# Patient Record
Sex: Male | Born: 1960
Health system: Southern US, Community
[De-identification: ages and names within clinical notes are randomized; demographics above are authoritative.]

## PROBLEM LIST (undated history)

## (undated) DIAGNOSIS — I2699 Other pulmonary embolism without acute cor pulmonale: Secondary | ICD-10-CM

## (undated) DIAGNOSIS — J189 Pneumonia, unspecified organism: Secondary | ICD-10-CM

## (undated) DIAGNOSIS — I82409 Acute embolism and thrombosis of unspecified deep veins of unspecified lower extremity: Secondary | ICD-10-CM

## (undated) HISTORY — PX: APPENDECTOMY: SHX54

## (undated) HISTORY — DX: Acute embolism and thrombosis of unspecified deep veins of unspecified lower extremity: I82.409

## (undated) HISTORY — PX: OTHER SURGICAL HISTORY: SHX169

## (undated) HISTORY — PX: TONSILLECTOMY: SUR1361

---

## 2007-05-18 ENCOUNTER — Observation Stay (HOSPITAL_COMMUNITY): Admission: EM | Admit: 2007-05-18 | Discharge: 2007-05-19 | Payer: Self-pay | Admitting: Emergency Medicine

## 2007-05-18 ENCOUNTER — Encounter (INDEPENDENT_AMBULATORY_CARE_PROVIDER_SITE_OTHER): Payer: Self-pay | Admitting: General Surgery

## 2007-12-17 ENCOUNTER — Emergency Department (HOSPITAL_COMMUNITY): Admission: EM | Admit: 2007-12-17 | Discharge: 2007-12-17 | Payer: Self-pay | Admitting: Emergency Medicine

## 2007-12-26 ENCOUNTER — Emergency Department (HOSPITAL_COMMUNITY): Admission: EM | Admit: 2007-12-26 | Discharge: 2007-12-26 | Payer: Self-pay | Admitting: Emergency Medicine

## 2010-07-12 NOTE — H&P (Signed)
NAME:  BURGESS, SHERIFF NO.:  [id number]   MEDICAL RECORD NO.:  [id number]          PATIENT TYPE:  INP   LOCATION:  5127                         FACILITY:  [facility]   PHYSICIAN:  Lennie Muckle, MD      DATE OF BIRTH:  1960-11-16   DATE OF ADMISSION:  05/18/2007  DATE OF DISCHARGE:                              HISTORY & PHYSICAL   REASON FOR ADMISSION:  Appendicitis.   HISTORY OF PRESENT ILLNESS:  Mr. Whetsel is a 50 year old male who began  having abdominal pain approximately 9:00 on Friday evening.  The pain  persisted, became somewhat worse, was originally located higher in the  right side of the abdomen, however, was migrated down into the  suprapubic region.  He has had no diarrhea, but has had nausea and  vomiting.  The pain is worse with ambulation.  He has not been around  any sick contacts or been sick himself.  CT scan was obtained which  revealed a thickened appendix with a fecalith.   PAST MEDICAL HISTORY:  Negative.   PAST SURGICAL HISTORY:  None.   SOCIAL HISTORY:  Does smoke, occasional alcohol.  No drug allergies.   REVIEW OF SYSTEMS:  Negative.   PHYSICAL EXAMINATION:  GENERAL:  He is a well-developed, well-nourished  male.  No acute distress.  HEENT:  Normocephalic, atraumatic.  Pupils are equal and round.  CHEST:  Clear to auscultation bilaterally.  CARDIOVASCULAR:  Regular rate and rhythm.  ABDOMEN:   DICTATION ENDED HERE      Lennie Muckle, MD     ALA/MEDQ  D:  05/19/2007  T:  05/19/2007  Job:  [id number]

## 2010-07-12 NOTE — H&P (Signed)
NAME:  Daniel Glover, Daniel Glover NO.:  [id number]   MEDICAL RECORD NO.:  [id number]          PATIENT TYPE:  INP   LOCATION:  5127                         FACILITY:  [facility]   PHYSICIAN:  Lennie Muckle, MD      DATE OF BIRTH:  03/22/1960   DATE OF ADMISSION:  05/18/2007  DATE OF DISCHARGE:                              HISTORY & PHYSICAL   DIAGNOSIS:  Appendicitis.   HISTORY OF PRESENT ILLNESS:  Mr. Rishel is a 50 year old male who began  having abdominal pain at approximately 9 o'clock last night.  The pain  continued which was located in the right lower quadrant.  It was  described as sharp pain.  He also had nausea and vomiting.  The pain was  worse with movement.  He has not had any similar types of pain.  No  fevers or chills.  No diarrhea.  A CT scan was obtained by the emergency  department which showed a thickened appendix.  No significant stranding  and no free fluid, but an enlarged appendix with a fecalith.   PAST MEDICAL HISTORY:  None.   PAST SURGICAL HISTORY:  None.   SOCIAL HISTORY:  He does occasionally have alcohol intake and smokes  tobacco.   ALLERGIES:  No drug allergies.   MEDICATIONS:  No medications.   REVIEW OF SYSTEMS:  Negative.   PHYSICAL EXAMINATION:  GENERAL:  He is a thin male, no acute distress.  VITAL SIGNS:  Blood pressure is 121/72, temperature 98, pulse is 54.  HEENT:  Extraocular muscles are intact.  CHEST:  Clear to auscultation bilaterally.  CARDIOVASCULAR:  Regular rate and rhythm.  ABDOMEN:  Scaphoid.  He is guarding more so on the right than the left.  He is tender to palpation in the right lower quadrant and significantly  tender in the suprapubic region on the right.  No rebound tenderness.   LABORATORY DATA:  Urinalysis is negative.  His white count is normal at  9.7.  He has some mild elevation of his bilirubin at 2.7.  Lipase is  mildly elevated at 64.   ASSESSMENT/PLAN:  Appendicitis.  Laparoscopic appendectomy for  tonight.  I discussed the risks of the surgery with the patient.  Will attempt  laparoscopic appendectomy, possibility of an open procedure.  I did  discuss with Mr.  Kroeker that if he has significant amount  of purulent fluid or findings,  I will continue antibiotics for a few days.  If this is relatively  unremarkable he will likely be able to be discharged home the following  day.  I also discussed the possibility of an abscess postoperatively.  Will proceed at the OR shortly.      Lennie Muckle, MD  Electronically Signed     ALA/MEDQ  D:  05/18/2007  T:  05/19/2007  Job:  [id number]

## 2010-07-12 NOTE — Op Note (Signed)
NAME:  Daniel Glover, Daniel Glover NO.:  [id number]   MEDICAL RECORD NO.:  [id number]          PATIENT TYPE:  INP   LOCATION:  1826                         FACILITY:  [facility]   PHYSICIAN:  Lennie Muckle, MD      DATE OF BIRTH:  1960/05/01   DATE OF PROCEDURE:  DATE OF DISCHARGE:                               OPERATIVE REPORT   DATE OF PROCEDURE:  May 18, 2007.   PREOPERATIVE DIAGNOSIS:  Appendicitis.   POSTOPERATIVE DIAGNOSIS:  Appendicitis.   PROCEDURE PERFORMED:  Laparoscopic appendectomy.   SURGEON:  Lennie Muckle, MD   ASSISTANT:  None.   ANESTHESIA:  General endotracheal anesthesia.   DRAINS:  None.   ESTIMATED BLOOD LOSS:  Minimal.   SPECIMENS:  Appendix.   COMPLICATIONS:  No immediate complications.   INDICATIONS FOR PROCEDURE:  Daniel Glover is a 50 year old male who began  hurting at 9 o'clock last night.  The pain was persistent, unrelenting.  It was located in the right lower quadrant and migrated down to the  suprapubic region.  CT revealed an enlarged appendix with a fecalith.  White count was normal and no fevers.  However, examination was  consistent with appendicitis.  Informed consent was obtained prior to  the procedure.   PROCEDURE IN DETAIL:  Daniel Glover was identified in the preoperative  holding suite, taken to the operating room and placed in the supine  position.  He was  given Zosyn preoperatively.  He was placed under  general endotracheal anesthesia.  He then had a Foley catheter placed.  His abdomen was prepped and draped in the usual sterile fashion.  Time-  out procedure indicated patient and procedure to be performed.  A  supraumbilical incision was placed with a #11 blade.  A Veress needle  was placed in the abdominal cavity.  After adequate pneumosufflation a 5  mm trocar was placed in the abdominal cavity using the Opti-View.  There  was no evidence upon placement of the camera or the Veress needle.  Another 5 mm trocar was placed  in the right upper quadrant.  The  appendix was identified, coursing inferiorly, and was adhered slightly  to the abdominal wall.  This was taken down very easily without  complications with blunt dissection.  A 12 mm trocar was placed in the  left lower quadrant.  The appendix was grasped and retracted away from  the cecum.  A rent was made in the peritoneum at the base of the  appendix.  This was stapled using a laparoscopic GIA stapler.  The  mesoappendix was also stapled using a vascular load of the laparoscopic  stapler.  The abdomen was irrigated with one liter of saline.  I did  briefly cauterize the staple line at the mesoappendix due to a minimal  amount of oozing.  The area was once again inspected and there was no  oozing.  The appendix was removed from the abdominal cavity in the Endo-  catch bag.  I then inspected the abdominal cavity.  There was no  evidence of bleeding, either at the  staple line or at any other area of  the abdominal cavity.  The 12 mm port site was closed with 0 Vicryl  suture of the fascia.  The skin was closed with 4-0 Vicryl suture.  Steri-Strips were placed as final dressing.  The patient was extubated,  transported to the post anesthesia care unit in stable condition.   DISCHARGE INSTRUCTIONS:  The patient will likely be able to be  discharged home tomorrow, if his pain is controlled and he is tolerating  a liquid diet.  He will follow up with me in approximately two or three  weeks.      Lennie Muckle, MD  Electronically Signed     ALA/MEDQ  D:  05/18/2007  T:  05/19/2007  Job:  [id number]

## 2010-11-21 LAB — HEPATIC FUNCTION PANEL
ALT: 14
Alkaline Phosphatase: 63
Indirect Bilirubin: 2.2 — ABNORMAL HIGH
Total Protein: 7.2

## 2010-11-21 LAB — I-STAT 8, (EC8 V) (CONVERTED LAB)
BUN: 12
Glucose, Bld: 119 — ABNORMAL HIGH
HCT: 55 — ABNORMAL HIGH
Operator id: 265201
Potassium: 4.4
pCO2, Ven: 52 — ABNORMAL HIGH
pH, Ven: 7.406 — ABNORMAL HIGH

## 2010-11-21 LAB — URINALYSIS, ROUTINE W REFLEX MICROSCOPIC
Bilirubin Urine: NEGATIVE
Glucose, UA: NEGATIVE
Hgb urine dipstick: NEGATIVE
Ketones, ur: NEGATIVE
Protein, ur: NEGATIVE
Specific Gravity, Urine: 1.015
Urobilinogen, UA: 1

## 2010-11-21 LAB — CBC
MCV: 89.5
Platelets: 214
WBC: 9.7

## 2010-11-21 LAB — POCT I-STAT CREATININE
Creatinine, Ser: 1.2
Operator id: 265201

## 2010-11-21 LAB — DIFFERENTIAL
Lymphocytes Relative: 6 — ABNORMAL LOW
Neutro Abs: 8.8 — ABNORMAL HIGH

## 2011-10-28 ENCOUNTER — Emergency Department (HOSPITAL_COMMUNITY): Payer: Self-pay

## 2011-10-28 ENCOUNTER — Encounter (HOSPITAL_COMMUNITY): Payer: Self-pay | Admitting: *Deleted

## 2011-10-28 DIAGNOSIS — R079 Chest pain, unspecified: Secondary | ICD-10-CM | POA: Insufficient documentation

## 2011-10-28 DIAGNOSIS — J189 Pneumonia, unspecified organism: Secondary | ICD-10-CM | POA: Insufficient documentation

## 2011-10-28 DIAGNOSIS — F17200 Nicotine dependence, unspecified, uncomplicated: Secondary | ICD-10-CM | POA: Insufficient documentation

## 2011-10-28 LAB — CBC WITH DIFFERENTIAL/PLATELET
Hemoglobin: 14.6 g/dL (ref 13.0–17.0)
Lymphocytes Relative: 17 % (ref 12–46)
Lymphs Abs: 1.8 10*3/uL (ref 0.7–4.0)
MCV: 88.3 fL (ref 78.0–100.0)
Monocytes Relative: 9 % (ref 3–12)
Neutrophils Relative %: 73 % (ref 43–77)
Platelets: 176 10*3/uL (ref 150–400)
RDW: 14.1 % (ref 11.5–15.5)

## 2011-10-28 LAB — BASIC METABOLIC PANEL
BUN: 13 mg/dL (ref 6–23)
GFR calc Af Amer: 88 mL/min — ABNORMAL LOW (ref >90)
GFR calc non Af Amer: 76 mL/min — ABNORMAL LOW (ref >90)
Potassium: 3.9 mEq/L (ref 3.5–5.1)

## 2011-10-28 NOTE — ED Notes (Signed)
PT reports SHOB and Lt chest  Wall pain that increases when inhaling.. Pt also has temp 100.2

## 2011-10-29 ENCOUNTER — Emergency Department (HOSPITAL_COMMUNITY)
Admission: EM | Admit: 2011-10-29 | Discharge: 2011-10-29 | Disposition: A | Discharge status: Home / Self Care | Payer: Self-pay | Attending: Emergency Medicine | Admitting: Emergency Medicine

## 2011-10-29 ENCOUNTER — Encounter (HOSPITAL_COMMUNITY): Payer: Self-pay | Admitting: Emergency Medicine

## 2011-10-29 DIAGNOSIS — J189 Pneumonia, unspecified organism: Secondary | ICD-10-CM

## 2011-10-29 DIAGNOSIS — Z720 Tobacco use: Secondary | ICD-10-CM

## 2011-10-29 HISTORY — DX: Pneumonia, unspecified organism: J18.9

## 2011-10-29 MED ORDER — TRAMADOL HCL 50 MG PO TABS: 50.0000 mg | ORAL_TABLET | Freq: Four times a day (QID) | ORAL | Status: AC | PRN

## 2011-10-29 MED ORDER — OXYCODONE-ACETAMINOPHEN 5-325 MG PO TABS
2.0000 | ORAL_TABLET | Freq: Once | ORAL | Status: AC
  Administered 2011-10-29: 2 via ORAL
  Filled 2011-10-29: qty 2

## 2011-10-29 MED ORDER — LEVOFLOXACIN 750 MG PO TABS
750.0000 mg | ORAL_TABLET | Freq: Every day | ORAL | Status: DC
  Administered 2011-10-29: 750 mg via ORAL
  Filled 2011-10-29: qty 1

## 2011-10-29 MED ORDER — LEVOFLOXACIN 750 MG PO TABS: ORAL_TABLET | ORAL | Status: DC

## 2011-10-29 NOTE — ED Provider Notes (Signed)
History     CSN: [id number]  Arrival date & time 10/28/11  1846   First MD Initiated Contact with Patient 10/29/11 [phone]      Chief Complaint  Patient presents with  . Shortness of Breath    (Consider location/radiation/quality/duration/timing/severity/associated sxs/prior treatment) HPI  Daniel Glover is a 51 year old smoker with history of several episodes of pneumonia, remotely. He presents today with complaints of left-sided chest pain and some mild shortness of breath with exertion. His pain localizes to the lower lateral region of the chest. It's worse with movement. It is not worse with deep inspiration. The patient has had a productive cough. He denies fever. His pain is nonradiating. At worst, it  is an 8/10. The patient says it feels like multiple previous episodes of pneumonia. Pain is constant.  Past Medical History  Diagnosis Date  . Pneumonia     Past Surgical History  Procedure Date  . Tonsillectomy     History reviewed. No pertinent family history.  History  Substance Use Topics  . Smoking status: Current Everyday Smoker    Types: Cigarettes  . Smokeless tobacco: Never Used  . Alcohol Use: No      Review of Systems  Gen: no weight loss, fevers, chills, night sweats Eyes: no discharge or drainage, no occular pain or visual changes Nose: no epistaxis or rhinorrhea Mouth: no dental pain, no sore throat Neck: no neck pain Lungs: As per history of present illness, otherwise negative CV: As per history of present illness, no palpitations, no dependent edema or orthopnea Abd: no abdominal pain, nausea, vomiting GU: no dysuria or gross hematuria MSK: no myalgias or arthralgias Neuro: no headache, no focal neurologic deficits Skin: no rash Psyche: negative.  Allergies  Review of patient's allergies indicates no known allergies.  Home Medications   Current Outpatient Rx  Name Route Sig Dispense Refill  . BC HEADACHE POWDER PO Oral Take 1 packet by  mouth daily as needed. For pain      BP 106/73  Pulse 72  Temp 98.3 F (36.8 C) (Oral)  Resp 19  SpO2 99%  Physical Exam  Gen: well developed and well nourished appearing Head: NCAT Eyes: PERL, EOMI Nose: no epistaixis or rhinorrhea Mouth/throat: mucosa is moist and pink Neck: supple, no stridor Lungs: CTA B, no wheezing, rhonchi or rales CV: RRR, no murmur, ext well perfused Abd: soft, notender Back: normal to inspection Skin: no rash, wnl Neuro: CN ii-xii grossly intact, no focal deficits Psyche; normal affect,  calm and cooperative.   ED Course  Procedures (including critical care time)  Labs Reviewed  CBC WITH DIFFERENTIAL - Abnormal; Notable for the following:    WBC 11.0 (*)     Neutro Abs 8.0 (*)     All other components within normal limits  BASIC METABOLIC PANEL - Abnormal; Notable for the following:    GFR calc non Af Amer 76 (*)     GFR calc Af Amer 88 (*)     All other components within normal limits   Dg Chest 2 View  10/28/2011  *RADIOLOGY REPORT*  Clinical Data: Shortness of breath, left chest pain, history smoking, pneumonia  CHEST - 2 VIEW  Comparison: None  Findings: Normal heart size, mediastinal contours, and pulmonary vascularity. Minimal atelectasis right base. Atelectasis versus early infiltrate at left lower lobe. Remaining lungs clear. No pleural effusion or pneumothorax. Bones unremarkable.  IMPRESSION: Atelectasis versus infiltrate left lower lobe.   Original Report Authenticated By: Redge Gainer.  BOLES, M.D.      EKG: nsr, no acute ischemic changes, normal intervals, normal axis, normal qrs complex   MDM  Patient with LLL infiltrate on xray and sx consistent with previous episodes of pneumonia. We will tx with Percocet and first dose of PO Levaquin. Patient counseled to return to the ED for worsening symptoms, shortness of breath, persistent fever > 101.5 or any other urgent health concerns. He is referred to [facility] for outpt f/u in 2 days.          Daniel Loosen, MD 10/29/11 [phone]

## 2012-01-07 ENCOUNTER — Encounter (HOSPITAL_COMMUNITY): Payer: Self-pay | Admitting: Emergency Medicine

## 2012-01-07 ENCOUNTER — Inpatient Hospital Stay (HOSPITAL_COMMUNITY)
Admission: EM | Admit: 2012-01-07 | Discharge: 2012-01-14 | DRG: 176 | Disposition: A | Discharge status: Home / Self Care | Payer: Self-pay | Attending: Family Medicine | Admitting: Family Medicine

## 2012-01-07 ENCOUNTER — Emergency Department (HOSPITAL_COMMUNITY): Payer: Self-pay

## 2012-01-07 ENCOUNTER — Observation Stay (HOSPITAL_COMMUNITY): Payer: Self-pay

## 2012-01-07 DIAGNOSIS — I82409 Acute embolism and thrombosis of unspecified deep veins of unspecified lower extremity: Secondary | ICD-10-CM | POA: Diagnosis present

## 2012-01-07 DIAGNOSIS — I824Z9 Acute embolism and thrombosis of unspecified deep veins of unspecified distal lower extremity: Secondary | ICD-10-CM | POA: Diagnosis present

## 2012-01-07 DIAGNOSIS — R0602 Shortness of breath: Secondary | ICD-10-CM

## 2012-01-07 DIAGNOSIS — I5189 Other ill-defined heart diseases: Secondary | ICD-10-CM | POA: Diagnosis present

## 2012-01-07 DIAGNOSIS — I513 Intracardiac thrombosis, not elsewhere classified: Secondary | ICD-10-CM | POA: Diagnosis present

## 2012-01-07 DIAGNOSIS — J189 Pneumonia, unspecified organism: Secondary | ICD-10-CM | POA: Diagnosis present

## 2012-01-07 DIAGNOSIS — F17200 Nicotine dependence, unspecified, uncomplicated: Secondary | ICD-10-CM | POA: Diagnosis present

## 2012-01-07 DIAGNOSIS — I2699 Other pulmonary embolism without acute cor pulmonale: Principal | ICD-10-CM | POA: Diagnosis present

## 2012-01-07 DIAGNOSIS — R071 Chest pain on breathing: Secondary | ICD-10-CM | POA: Diagnosis present

## 2012-01-07 LAB — CBC
HCT: 43.2 % (ref 39.0–52.0)
Hemoglobin: 15.6 g/dL (ref 13.0–17.0)
MCH: 31.3 pg (ref 26.0–34.0)
MCHC: 36.1 g/dL — ABNORMAL HIGH (ref 30.0–36.0)
MCV: 86.6 fL (ref 78.0–100.0)
Platelets: 159 K/uL (ref 150–400)
RBC: 4.99 MIL/uL (ref 4.22–5.81)
RDW: 13.5 % (ref 11.5–15.5)
WBC: 13.6 K/uL — ABNORMAL HIGH (ref 4.0–10.5)

## 2012-01-07 LAB — POCT I-STAT, CHEM 8
BUN: 9 mg/dL (ref 6–23)
Calcium, Ion: 1.13 mmol/L (ref 1.12–1.23)
Chloride: 103 meq/L (ref 96–112)
Creatinine, Ser: 1.2 mg/dL (ref 0.50–1.35)
Glucose, Bld: 114 mg/dL — ABNORMAL HIGH (ref 70–99)
HCT: 46 % (ref 39.0–52.0)
Hemoglobin: 15.6 g/dL (ref 13.0–17.0)
Potassium: 3.9 meq/L (ref 3.5–5.1)
Sodium: 138 meq/L (ref 135–145)
TCO2: 25 mmol/L (ref 0–100)

## 2012-01-07 LAB — TROPONIN I
Troponin I: <0.3 ng/mL (ref <0.30)
Troponin I: <0.3 ng/mL (ref <0.30)

## 2012-01-07 LAB — COMPREHENSIVE METABOLIC PANEL
AST: 20 U/L (ref 0–37)
Albumin: 3.3 g/dL — ABNORMAL LOW (ref 3.5–5.2)
Calcium: 9 mg/dL (ref 8.4–10.5)
Creatinine, Ser: 1.06 mg/dL (ref 0.50–1.35)
GFR calc non Af Amer: 81 mL/min — ABNORMAL LOW (ref >90)
Total Protein: 7 g/dL (ref 6.0–8.3)

## 2012-01-07 LAB — LEGIONELLA ANTIGEN, URINE: Legionella Antigen, Urine: NEGATIVE

## 2012-01-07 LAB — STREP PNEUMONIAE URINARY ANTIGEN: Strep Pneumo Urinary Antigen: NEGATIVE

## 2012-01-07 LAB — ANTITHROMBIN III: AntiThromb III Func: 84 % (ref 75–120)

## 2012-01-07 LAB — HEPARIN LEVEL (UNFRACTIONATED)
Heparin Unfractionated: 0.24 IU/mL — ABNORMAL LOW (ref 0.30–0.70)
Heparin Unfractionated: 0.27 IU/mL — ABNORMAL LOW (ref 0.30–0.70)

## 2012-01-07 MED ORDER — OXYCODONE-ACETAMINOPHEN 5-325 MG PO TABS
2.0000 | ORAL_TABLET | Freq: Once | ORAL | Status: AC
  Administered 2012-01-07: 2 via ORAL
  Filled 2012-01-07: qty 2

## 2012-01-07 MED ORDER — SODIUM CHLORIDE 0.9 % IV SOLN
INTRAVENOUS | Status: DC
  Administered 2012-01-07 – 2012-01-09 (×4): via INTRAVENOUS
  Administered 2012-01-09: 100 mL/h via INTRAVENOUS
  Administered 2012-01-10: 21:00:00 via INTRAVENOUS
  Administered 2012-01-10: 100 mL via INTRAVENOUS
  Administered 2012-01-11: 20:00:00 via INTRAVENOUS
  Administered 2012-01-12: 100 mL/h via INTRAVENOUS
  Administered 2012-01-12 – 2012-01-13 (×2): via INTRAVENOUS

## 2012-01-07 MED ORDER — MOXIFLOXACIN HCL IN NACL 400 MG/250ML IV SOLN
400.0000 mg | Freq: Once | INTRAVENOUS | Status: AC
  Administered 2012-01-07: 400 mg via INTRAVENOUS
  Filled 2012-01-07: qty 250

## 2012-01-07 MED ORDER — IOHEXOL 350 MG/ML SOLN
100.0000 mL | Freq: Once | INTRAVENOUS | Status: AC | PRN
Start: 2012-01-07 — End: 2012-01-07
  Administered 2012-01-07: 100 mL via INTRAVENOUS

## 2012-01-07 MED ORDER — HEPARIN BOLUS VIA INFUSION
4000.0000 [IU] | Freq: Once | INTRAVENOUS | Status: AC
  Administered 2012-01-07: 4000 [IU] via INTRAVENOUS

## 2012-01-07 MED ORDER — OXYCODONE HCL 5 MG PO TABS
5.0000 mg | ORAL_TABLET | ORAL | Status: DC | PRN
  Administered 2012-01-07 (×2): 10 mg via ORAL
  Administered 2012-01-08 (×2): 5 mg via ORAL
  Administered 2012-01-08 – 2012-01-09 (×3): 10 mg via ORAL
  Filled 2012-01-07 (×4): qty 2
  Filled 2012-01-07: qty 1
  Filled 2012-01-07: qty 2
  Filled 2012-01-07: qty 1

## 2012-01-07 MED ORDER — ENOXAPARIN SODIUM 40 MG/0.4ML ~~LOC~~ SOLN
40.0000 mg | SUBCUTANEOUS | Status: DC
  Filled 2012-01-07: qty 0.4

## 2012-01-07 MED ORDER — DEXTROSE 5 % IV SOLN
1.0000 g | INTRAVENOUS | Status: DC
  Administered 2012-01-07 – 2012-01-10 (×4): 1 g via INTRAVENOUS
  Filled 2012-01-07 (×4): qty 10

## 2012-01-07 MED ORDER — ONDANSETRON HCL 4 MG/2ML IJ SOLN
4.0000 mg | Freq: Once | INTRAMUSCULAR | Status: AC
  Administered 2012-01-07: 4 mg via INTRAVENOUS
  Filled 2012-01-07 (×2): qty 2

## 2012-01-07 MED ORDER — HEPARIN BOLUS VIA INFUSION
1500.0000 [IU] | Freq: Once | INTRAVENOUS | Status: AC
  Administered 2012-01-07: 1500 [IU] via INTRAVENOUS
  Filled 2012-01-07: qty 1500

## 2012-01-07 MED ORDER — HEPARIN (PORCINE) IN NACL 100-0.45 UNIT/ML-% IJ SOLN
1500.0000 [IU]/h | INTRAMUSCULAR | Status: DC
  Administered 2012-01-07 – 2012-01-08 (×2): 1500 [IU]/h via INTRAVENOUS
  Filled 2012-01-07 (×3): qty 250

## 2012-01-07 MED ORDER — MORPHINE SULFATE 4 MG/ML IJ SOLN
4.0000 mg | Freq: Once | INTRAMUSCULAR | Status: AC
  Administered 2012-01-07: 4 mg via INTRAVENOUS
  Filled 2012-01-07: qty 1

## 2012-01-07 MED ORDER — HEPARIN BOLUS VIA INFUSION
1000.0000 [IU] | Freq: Once | INTRAVENOUS | Status: AC
  Administered 2012-01-07: 1000 [IU] via INTRAVENOUS
  Filled 2012-01-07: qty 1000

## 2012-01-07 MED ORDER — WARFARIN - PHARMACIST DOSING INPATIENT
Freq: Every day | Status: DC
  Administered 2012-01-09 – 2012-01-13 (×3)

## 2012-01-07 MED ORDER — AZITHROMYCIN 500 MG PO TABS
500.0000 mg | ORAL_TABLET | ORAL | Status: DC
  Administered 2012-01-07 – 2012-01-10 (×4): 500 mg via ORAL
  Filled 2012-01-07 (×4): qty 1

## 2012-01-07 MED ORDER — HYDROMORPHONE HCL PF 1 MG/ML IJ SOLN: 1.0000 mg | INTRAMUSCULAR | Status: DC | PRN

## 2012-01-07 MED ORDER — PATIENT'S GUIDE TO USING COUMADIN BOOK
Freq: Once | Status: AC
  Administered 2012-01-07: 08:00:00
  Filled 2012-01-07: qty 1

## 2012-01-07 MED ORDER — HEPARIN (PORCINE) IN NACL 100-0.45 UNIT/ML-% IJ SOLN
1350.0000 [IU]/h | INTRAMUSCULAR | Status: DC
  Filled 2012-01-07 (×2): qty 250

## 2012-01-07 MED ORDER — ACETAMINOPHEN 325 MG PO TABS
650.0000 mg | ORAL_TABLET | Freq: Four times a day (QID) | ORAL | Status: DC | PRN
  Administered 2012-01-14: 650 mg via ORAL
  Filled 2012-01-07: qty 2

## 2012-01-07 MED ORDER — PANTOPRAZOLE SODIUM 40 MG PO TBEC
40.0000 mg | DELAYED_RELEASE_TABLET | Freq: Every day | ORAL | Status: DC
  Administered 2012-01-07 – 2012-01-13 (×7): 40 mg via ORAL
  Filled 2012-01-07 (×8): qty 1

## 2012-01-07 MED ORDER — WARFARIN VIDEO
Freq: Once | Status: AC
  Administered 2012-01-07: 08:00:00

## 2012-01-07 MED ORDER — WARFARIN SODIUM 10 MG PO TABS
10.0000 mg | ORAL_TABLET | Freq: Once | ORAL | Status: AC
  Administered 2012-01-07: 10 mg via ORAL
  Filled 2012-01-07: qty 1

## 2012-01-07 MED ORDER — HEPARIN (PORCINE) IN NACL 100-0.45 UNIT/ML-% IJ SOLN
1200.0000 [IU]/h | INTRAMUSCULAR | Status: DC
  Administered 2012-01-07: 1200 [IU]/h via INTRAVENOUS
  Filled 2012-01-07: qty 250

## 2012-01-07 MED ORDER — COUMADIN BOOK
Freq: Once | Status: AC
  Administered 2012-01-07: 14:00:00
  Filled 2012-01-07: qty 1

## 2012-01-07 NOTE — Progress Notes (Signed)
Right:  DVT noted popliteal and distal femoral veins.  No evidence of superficial thrombosis.  No Baker's cyst.  Left:  No evidence of DVT, superficial thrombosis, or Baker's cyst.

## 2012-01-07 NOTE — Progress Notes (Signed)
ANTICOAGULATION CONSULT NOTE - Follow up  Pharmacy Consult for heparin Indication: pulmonary embolus  No Known Allergies  Patient Measurements: Height: 5\' 6"  (167.6 cm) Weight: 170 lb (77.111 kg) IBW/kg (Calculated) : 63.8   Vital Signs: Temp: 98.9 F (37.2 C) (11/10 2100) Temp src: Oral (11/10 1400) BP: 120/67 mmHg (11/10 2100) Pulse Rate: 71  (11/10 2100)  Labs:  Basename 01/07/12 2040 01/07/12 1818 01/07/12 1259 01/07/12 1127 01/07/12 0724 01/07/12 0235 01/07/12 0222  HGB -- -- -- -- -- 15.6 15.6  HCT -- -- -- -- -- 46.0 43.2  PLT -- -- -- -- -- -- 159  APTT -- -- -- -- -- -- --  LABPROT -- -- -- -- -- -- --  INR -- -- -- -- -- -- --  HEPARINUNFRC 0.27* -- 0.24* -- -- -- --  CREATININE -- -- -- -- 1.06 1.20 --  CKTOTAL -- -- -- -- -- -- --  CKMB -- -- -- -- -- -- --  TROPONINI -- <0.30 -- <0.30 <0.30 -- --    Estimated Creatinine Clearance: 82.4 ml/min (by C-G formula based on Cr of 1.06).   Medical History: Past Medical History  Diagnosis Date  . Pneumonia     Medications:  No prescriptions prior to admission   Scheduled:     . azithromycin  500 mg Oral Q24H  . cefTRIAXone (ROCEPHIN)  IV  1 g Intravenous Q24H  . [COMPLETED] coumadin book   Does not apply Once  . [COMPLETED] heparin  1,000 Units Intravenous Once  . [COMPLETED] heparin  4,000 Units Intravenous Once  . [COMPLETED]  morphine injection  4 mg Intravenous Once  . [COMPLETED] moxifloxacin  400 mg Intravenous Once  . [COMPLETED] ondansetron  4 mg Intravenous Once  . [COMPLETED] oxyCODONE-acetaminophen  2 tablet Oral Once  . pantoprazole  40 mg Oral Q1200  . [COMPLETED] patient's guide to using coumadin book   Does not apply Once  . [COMPLETED] warfarin  10 mg Oral ONCE-1800  . [COMPLETED] warfarin   Does not apply Once  . Warfarin - Pharmacist Dosing Inpatient   Does not apply q1800  . [DISCONTINUED] enoxaparin  40 mg Subcutaneous Q24H   Infusions:     . sodium chloride 100 mL/hr at  01/07/12 2019  . heparin 1,350 Units/hr (01/07/12 1403)  . [DISCONTINUED] heparin 1,200 Units/hr (01/07/12 0737)    Assessment: 6hr heparin level = 0.27 on 1350 units/hr IV heparin infusion in this 51yo male on heparin and coumadin for PE.  Hypercoag panels in progress. Heparin level remains subtherapeutic.  No signs of bleeding /verified with RN.  Goal of Therapy:  Heparin level 0.3-0.7 Monitor platelets by anticoagulation protocol: Yes   Plan:  Give bolus of heparin 1500 units IV x 1 and increase Heparin drip to 1500units/hr.   Check heparin level  In ~ 6 hours with AM labs.  Daily heparin level/cbc.  Noah Delaine, RPh Clinical Pharmacist Pager: [phone] 01/07/2012 22:15PM

## 2012-01-07 NOTE — Progress Notes (Signed)
Was notified of the results of CTA showing  PE. Will start on heparin and coumadin and write for hypercoagulable panel. Will also obtain 2D echo to evaluate for pulmonaryr hypertension as I suspect this is not his first PE. Dopplers of Lower extremities to assess clot burden. He may need outpatient CA work up. Patient is hemodynamically stable.

## 2012-01-07 NOTE — Progress Notes (Signed)
ANTICOAGULATION CONSULT NOTE - Initial Consult  Pharmacy Consult for heparin/warfarin Indication: pulmonary embolus  No Known Allergies  Patient Measurements: Height: 5\' 6"  (167.6 cm) Weight: 170 lb (77.111 kg) IBW/kg (Calculated) : 63.8   Vital Signs: Temp: 98.4 F (36.9 C) (11/10 0625) Temp src: Oral (11/10 0336) BP: 111/72 mmHg (11/10 0625) Pulse Rate: 80  (11/10 0625)  Labs:  Basename 01/07/12 0235 01/07/12 0222  HGB 15.6 15.6  HCT 46.0 43.2  PLT -- 159  APTT -- --  LABPROT -- --  INR -- --  HEPARINUNFRC -- --  CREATININE 1.20 --  CKTOTAL -- --  CKMB -- --  TROPONINI -- --    Estimated Creatinine Clearance: 72.8 ml/min (by C-G formula based on Cr of 1.2).   Medical History: Past Medical History  Diagnosis Date  . Pneumonia     Medications:  No prescriptions prior to admission   Scheduled:    . azithromycin  500 mg Oral Q24H  . cefTRIAXone (ROCEPHIN)  IV  1 g Intravenous Q24H  . heparin  4,000 Units Intravenous Once  . [COMPLETED]  morphine injection  4 mg Intravenous Once  . [COMPLETED] moxifloxacin  400 mg Intravenous Once  . [COMPLETED] ondansetron  4 mg Intravenous Once  . pantoprazole  40 mg Oral Q1200  . patient's guide to using coumadin book   Does not apply Once  . warfarin  10 mg Oral ONCE-1800  . warfarin   Does not apply Once  . Warfarin - Pharmacist Dosing Inpatient   Does not apply q1800  . [DISCONTINUED] enoxaparin  40 mg Subcutaneous Q24H   Infusions:    . sodium chloride    . heparin      Assessment: 51yo male c/o Sx similar to prior PNA, found on CT to have prominent PE within pulmonary arteries to all lobes of both lungs, to begin anticoagulation; hypercoag panels to be ordered, thought that this may not be pt's first PE.  Goal of Therapy:  INR 2-3 Heparin level 0.3-0.7 units/ml Monitor platelets by anticoagulation protocol: Yes   Plan:  Heparin bolus of 4000 units ordered by admitting MD; will begin heparin gtt at 1200  units/hr and monitor heparin levels and CBC; will give Coumadin 10mg  po x1 and monitor INR for dose adjustments and begin Coumadin education.  Colleen Can PharmD BCPS 01/07/2012,6:50 AM

## 2012-01-07 NOTE — ED Notes (Signed)
Pt complains of difficulty breathing with pain on left side of lower chest/rib cage.  Pt states that he has had pneumonia before and that is what this feels like to him.  Pt. Has normal chest excursion bilaterally with no visible trauma.

## 2012-01-07 NOTE — Progress Notes (Signed)
Pt set up to watch coumadin video on channel 109.  Pt has no questions or concerns at present.

## 2012-01-07 NOTE — ED Provider Notes (Signed)
History     CSN: [id number]  Arrival date & time 01/07/12  0107   First MD Initiated Contact with Patient 01/07/12 0206      Chief Complaint  Patient presents with  . Shortness of Breath  . Breathing Problem    (Consider location/radiation/quality/duration/timing/severity/associated sxs/prior treatment) HPI HX per PT. Fever, cough, CP and SOB x 2 days, presents with concern for recurrent pneumonia. IS a smoker, denies any other medical problems, has had 3 episodes of PNA this year, last time was 2 months ago. Tonight pain is L sided sharp and hurts to take a deep breath, no leg pain ore swelling or h/o DVT/ PE. Mild productive sputum. Symptoms described as MOD to Severe. Worse with exertion, no known alleviating fators Past Medical History  Diagnosis Date  . Pneumonia     Past Surgical History  Procedure Date  . Tonsillectomy   . Appendectomy     History reviewed. No pertinent family history.  History  Substance Use Topics  . Smoking status: Current Every Day Smoker -- 1.0 packs/day for 15 years    Types: Cigarettes  . Smokeless tobacco: Never Used  . Alcohol Use: Yes     Comment: occassionally      Review of Systems  Constitutional: Positive for fever and chills.  HENT: Negative for trouble swallowing, neck pain and neck stiffness.   Eyes: Negative for pain.  Respiratory: Positive for cough and shortness of breath.   Cardiovascular: Positive for chest pain.  Gastrointestinal: Negative for vomiting and abdominal pain.  Genitourinary: Negative for dysuria.  Musculoskeletal: Negative for back pain.  Skin: Negative for rash.  Neurological: Negative for headaches.  All other systems reviewed and are negative.    Allergies  Review of patient's allergies indicates no known allergies.  Home Medications  No current outpatient prescriptions on file.  BP 138/69  Pulse 86  Temp 99.4 F (37.4 C) (Oral)  Resp 19  SpO2 98%  Physical Exam  Constitutional: He  is oriented to person, place, and time. He appears well-developed and well-nourished.  HENT:  Head: Normocephalic and atraumatic.  Eyes: Conjunctivae normal and EOM are normal. Pupils are equal, round, and reactive to light.  Neck: Trachea normal. Neck supple. No thyromegaly present.  Cardiovascular: Normal rate, regular rhythm, S1 normal, S2 normal and normal pulses.     No systolic murmur is present   No diastolic murmur is present  Pulses:      Radial pulses are 2+ on the right side, and 2+ on the left side.  Pulmonary/Chest: Effort normal. He has no wheezes. He has no rhonchi. He has no rales. He exhibits no tenderness.       Mildly dec bilateral breath sounds with splinting present, no chest wall tenderness or crepitus  Abdominal: Soft. Normal appearance and bowel sounds are normal. There is no tenderness. There is no rebound, no guarding, no CVA tenderness and negative Murphy's sign.  Musculoskeletal:       BLE:s Calves nontender, no cords or erythema, negative Homans sign  Neurological: He is alert and oriented to person, place, and time. He has normal strength. No cranial nerve deficit or sensory deficit. GCS eye subscore is 4. GCS verbal subscore is 5. GCS motor subscore is 6.  Skin: Skin is warm and dry. No rash noted. He is not diaphoretic.  Psychiatric: His speech is normal.       Cooperative and appropriate    ED Course  Procedures (including critical care time)  Results for orders placed during the hospital encounter of 01/07/12  CBC      Component Value Range   WBC 13.6 (*) 4.0 - 10.5 K/uL   RBC 4.99  4.22 - 5.81 MIL/uL   Hemoglobin 15.6  13.0 - 17.0 g/dL   HCT 16.1  09.6 - 04.5 %   MCV 86.6  78.0 - 100.0 fL   MCH 31.3  26.0 - 34.0 pg   MCHC 36.1 (*) 30.0 - 36.0 g/dL   RDW 40.9  81.1 - 91.4 %   Platelets 159  150 - 400 K/uL  POCT I-STAT, CHEM 8      Component Value Range   Sodium 138  135 - 145 mEq/L   Potassium 3.9  3.5 - 5.1 mEq/L   Chloride 103  96 - 112  mEq/L   BUN 9  6 - 23 mg/dL   Creatinine, Ser 7.82  0.50 - 1.35 mg/dL   Glucose, Bld 956 (*) 70 - 99 mg/dL   Calcium, Ion 2.13  0.86 - 1.23 mmol/L   TCO2 25  0 - 100 mmol/L   Hemoglobin 15.6  13.0 - 17.0 g/dL   HCT 57.8  46.9 - 62.9 %  HIV ANTIBODY (ROUTINE TESTING)      Component Value Range   HIV NON REACTIVE  NON REACTIVE  LEGIONELLA ANTIGEN, URINE      Component Value Range   Specimen Description URINE, CLEAN CATCH     Special Requests NONE     Legionella Antigen, Urine Negative for Legionella pneumophilia serogroup 1     Report Status 01/07/2012 FINAL    STREP PNEUMONIAE URINARY ANTIGEN      Component Value Range   Strep Pneumo Urinary Antigen NEGATIVE  NEGATIVE  COMPREHENSIVE METABOLIC PANEL      Component Value Range   Sodium 138  135 - 145 mEq/L   Potassium 4.2  3.5 - 5.1 mEq/L   Chloride 102  96 - 112 mEq/L   CO2 26  19 - 32 mEq/L   Glucose, Bld 105 (*) 70 - 99 mg/dL   BUN 10  6 - 23 mg/dL   Creatinine, Ser 5.28  0.50 - 1.35 mg/dL   Calcium 9.0  8.4 - 41.3 mg/dL   Total Protein 7.0  6.0 - 8.3 g/dL   Albumin 3.3 (*) 3.5 - 5.2 g/dL   AST 20  0 - 37 U/L   ALT 20  0 - 53 U/L   Alkaline Phosphatase 71  39 - 117 U/L   Total Bilirubin 1.2  0.3 - 1.2 mg/dL   GFR calc non Af Amer 81 (*) >90 mL/min   GFR calc Af Amer >90  >90 mL/min  TROPONIN I      Component Value Range   Troponin I <0.30  <0.30 ng/mL  TROPONIN I      Component Value Range   Troponin I <0.30  <0.30 ng/mL  TROPONIN I      Component Value Range   Troponin I <0.30  <0.30 ng/mL  ANTITHROMBIN III      Component Value Range   AntiThromb III Func 84  75 - 120 %  HOMOCYSTEINE      Component Value Range   Homocysteine 6.0  4.0 - 15.4 umol/L  HEPARIN LEVEL (UNFRACTIONATED)      Component Value Range   Heparin Unfractionated 0.24 (*) 0.30 - 0.70 IU/mL  HEPARIN LEVEL (UNFRACTIONATED)      Component Value Range   Heparin Unfractionated  0.27 (*) 0.30 - 0.70 IU/mL  CBC      Component Value Range   WBC  10.7 (*) 4.0 - 10.5 K/uL   RBC 4.36  4.22 - 5.81 MIL/uL   Hemoglobin 13.6  13.0 - 17.0 g/dL   HCT 16.1 (*) 09.6 - 04.5 %   MCV 88.3  78.0 - 100.0 fL   MCH 31.2  26.0 - 34.0 pg   MCHC 35.3  30.0 - 36.0 g/dL   RDW 40.9  81.1 - 91.4 %   Platelets 167  150 - 400 K/uL  HEPARIN LEVEL (UNFRACTIONATED)      Component Value Range   Heparin Unfractionated 0.49  0.30 - 0.70 IU/mL  PROTIME-INR      Component Value Range   Prothrombin Time 15.3 (*) 11.6 - 15.2 seconds   INR 1.23  0.00 - 1.49  BASIC METABOLIC PANEL      Component Value Range   Sodium 137  135 - 145 mEq/L   Potassium 3.8  3.5 - 5.1 mEq/L   Chloride 102  96 - 112 mEq/L   CO2 25  19 - 32 mEq/L   Glucose, Bld 85  70 - 99 mg/dL   BUN 12  6 - 23 mg/dL   Creatinine, Ser 7.82  0.50 - 1.35 mg/dL   Calcium 8.7  8.4 - 95.6 mg/dL   GFR calc non Af Amer 83 (*) >90 mL/min   GFR calc Af Amer >90  >90 mL/min   Dg Chest 2 View  01/07/2012  *RADIOLOGY REPORT*  Clinical Data: Shortness of breath and left chest pain.  CHEST - 2 VIEW  Comparison: Chest radiograph performed 10/28/2011  Findings: The lungs are well-aerated.  Mild focal left lower lobe opacity is compatible with pneumonia.  There is no evidence of pleural effusion or pneumothorax.  The heart is normal in size; the mediastinal contour is within normal limits.  No acute osseous abnormalities are seen.  IMPRESSION: Mild left lower lobe pneumonia noted.   Original Report Authenticated By: Tonia Ghent, M.D.    Ct Angio Chest Pe W/cm &/or Wo Cm  01/07/2012  *RADIOLOGY REPORT*  Clinical Data: Left-sided pleuritic chest pain and shortness of breath.  CT ANGIOGRAPHY CHEST  Technique:  Multidetector CT imaging of the chest using the standard protocol during bolus administration of intravenous contrast. Multiplanar reconstructed images including MIPs were obtained and reviewed to evaluate the vascular anatomy.  Contrast: OMNIPAQUE IOHEXOL 350 MG/ML SOLN  Comparison: Chest radiograph  performed earlier today at 02:35 a.m.  Findings: There is prominent pulmonary embolus within pulmonary arteries to all lobes of both lungs.  There is greater clot burden on the right, with pulmonary embolus noted in the right main pulmonary artery, extending into segmental and subsegmental branches.  More distal left sided pulmonary emboli are seen.  Airspace opacification within the left lingula and left lower lobe is compatible with pulmonary infarct.  Mild right basilar airspace opacity may also reflect pulmonary infarct, though there is some degree of associated bleb formation, of uncertain significance. There is no evidence of pleural effusion or pneumothorax.  No masses are identified; no abnormal focal contrast enhancement is seen.  The mediastinum is unremarkable in appearance.  No mediastinal lymphadenopathy is seen.  No pericardial effusion is identified. The great vessels are unremarkable in appearance.  No axillary lymphadenopathy is seen.  The visualized portions of the thyroid gland are unremarkable in appearance.  The visualized portions of the liver and spleen are unremarkable.  The visualized portions of the pancreas, stomach, adrenal glands and kidneys are within normal limits.  No acute osseous abnormalities are seen.  IMPRESSION:  1.  Prominent pulmonary embolus within pulmonary arteries to all lobes of both lungs.  Greater clot burden noted on the right, with pulmonary embolus in the right main pulmonary artery, extending into segmental and subsegmental branches.  More distal left sided pulmonary emboli seen. 2.  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  Critical Value/emergent results were called by telephone at the time of interpretation on 01/07/2012 at 05:59 a.m. to Dr. Sunnie Nielsen, who verbally acknowledged these results.   Original Report  Authenticated By: Tonia Ghent, M.D.      IV morphine. IV ABx  MED consult for borderline hypoxia, multiple episodes of PNA. Case d/w DR Adela Glimpse - will evaluate bedside for admit.   DR Adela Glimpse requesting CT scan prior to admit. CT reviewed and heparin initiated.  MDM   Recurrent CAP in 51 yo male. Work up as above. CXR and labs reviewed. IV heparin for PEs        Sunnie Nielsen, MD 01/08/12 [phone]

## 2012-01-07 NOTE — Progress Notes (Signed)
Patient ID: Daniel Glover  male  OZD:[id number]    DOB: October 31, 1960    DOA: 01/07/2012  PCP: Sheila Oats, MD  Assessment/Plan: Principal Problem:  *Acute Bilateral PE (pulmonary embolism):  - Started on heparin drip and Coumadin, hemodynamically stable - Hypercoagulable panel in progress - Obtain 2-D echocardiogram, Doppler ultrasound of the lower extremities positive for acute DVT in the right lower extremity - If patient becomes hemodynamically unstable or hypoxic, will check with interventional radiology if catheter directed thrombolysis is feasible  Active Problems:  CAP (community acquired pneumonia): Appears more like pulmonary infarction with atelectasis - Continue IV Rocephin and Zithromax for now, incentive spirometry, O2   Pleuritic Chest Pain  - Add oxycodone and IV Dilaudid   Acute DVT Right LE: As #1  DVT Prophylaxis: On heparin drip and Coumadin  Code Status: Full code  Disposition: Given the high clot burden and bilateral PEs, will continue heparin drip with Coumadin  Subjective: Complaining of intractable pleuritic chest pain, no nausea, vomiting, abdominal pain   Objective: Weight change:  No intake or output data in the 24 hours ending 01/07/12 1328 Blood pressure 111/72, pulse 80, temperature 98.4 F (36.9 C), temperature source Oral, resp. rate 18, height 5\' 6"  (1.676 m), weight 77.111 kg (170 lb), SpO2 94.00%.  Physical Exam: General: Alert and awake, oriented x3, not in any acute distress. HEENT: anicteric sclera, pupils reactive to light and accommodation, EOMI CVS: S1-S2 clear, no murmur rubs or gallops Chest: clear to auscultation bilaterally, no wheezing, rales or rhonchi Abdomen: soft nontender, nondistended, normal bowel sounds, no organomegaly Extremities: no cyanosis, clubbing or edema noted bilaterally Neuro: Cranial nerves II-XII intact, no focal neurological deficits  Lab Results: Basic Metabolic Panel:  Lab 01/07/12 2595 01/07/12 0235    NA 138 138  K 4.2 3.9  CL 102 103  CO2 26 --  GLUCOSE 105* 114*  BUN 10 9  CREATININE 1.06 1.20  CALCIUM 9.0 --  MG -- --  PHOS -- --   Liver Function Tests:  Lab 01/07/12 0724  AST 20  ALT 20  ALKPHOS 71  BILITOT 1.2  PROT 7.0  ALBUMIN 3.3*  CBC:  Lab 01/07/12 0235 01/07/12 0222  WBC -- 13.6*  NEUTROABS -- --  HGB 15.6 15.6  HCT 46.0 43.2  MCV -- 86.6  PLT -- 159   Cardiac Enzymes:  Lab 01/07/12 0724  CKTOTAL --  CKMB --  CKMBINDEX --  TROPONINI <0.30    Studies/Results: Dg Chest 2 View  01/07/2012  *RADIOLOGY REPORT*  Clinical Data: Shortness of breath and left chest pain.  CHEST - 2 VIEW  Comparison: Chest radiograph performed 10/28/2011  Findings: The lungs are well-aerated.  Mild focal left lower lobe opacity is compatible with pneumonia.  There is no evidence of pleural effusion or pneumothorax.  The heart is normal in size; the mediastinal contour is within normal limits.  No acute osseous abnormalities are seen.  IMPRESSION: Mild left lower lobe pneumonia noted.   Original Report Authenticated By: Tonia Ghent, M.D.    Ct Angio Chest Pe W/cm &/or Wo Cm  01/07/2012    IMPRESSION:  1.  Prominent pulmonary embolus within pulmonary arteries to all lobes of both lungs.  Greater clot burden noted on the right, with pulmonary embolus in the right main pulmonary artery, extending into segmental and subsegmental branches.  More distal left sided pulmonary emboli seen. 2.  Pulmonary infarcts of the left lingula and left lower lobe; mild right basilar airspace opacity may  also reflect pulmonary infarct, though associated blebs are of uncertain significance.  Critical Value/emergent results were called by telephone at the time of interpretation on 01/07/2012 at 05:59 a.m. to Dr. Sunnie Nielsen, who verbally acknowledged these results.   Original Report Authenticated By: Tonia Ghent, M.D.     Medications: Scheduled Meds:   . azithromycin  500 mg Oral Q24H  .  cefTRIAXone (ROCEPHIN)  IV  1 g Intravenous Q24H  . [COMPLETED] heparin  4,000 Units Intravenous Once  . [COMPLETED]  morphine injection  4 mg Intravenous Once  . [COMPLETED] moxifloxacin  400 mg Intravenous Once  . [COMPLETED] ondansetron  4 mg Intravenous Once  . [COMPLETED] oxyCODONE-acetaminophen  2 tablet Oral Once  . pantoprazole  40 mg Oral Q1200  . [COMPLETED] patient's guide to using coumadin book   Does not apply Once  . warfarin  10 mg Oral ONCE-1800  . [COMPLETED] warfarin   Does not apply Once  . Warfarin - Pharmacist Dosing Inpatient   Does not apply q1800  . [DISCONTINUED] enoxaparin  40 mg Subcutaneous Q24H      LOS: 0 days   RAI,RIPUDEEP M.D. [facility] 01/07/2012, 1:28 PM Pager: [phone]  If 7PM-7AM, please contact night-coverage [email or web address] Password [email or web address]

## 2012-01-07 NOTE — Progress Notes (Signed)
ANTICOAGULATION CONSULT NOTE - Follow up  Pharmacy Consult for heparin Indication: pulmonary embolus  No Known Allergies  Patient Measurements: Height: 5\' 6"  (167.6 cm) Weight: 170 lb (77.111 kg) IBW/kg (Calculated) : 63.8   Vital Signs: Temp: 98.4 F (36.9 C) (11/10 0625) Temp src: Oral (11/10 0336) BP: 111/72 mmHg (11/10 0625) Pulse Rate: 80  (11/10 0625)  Labs:  Basename 01/07/12 1259 01/07/12 1127 01/07/12 0724 01/07/12 0235 01/07/12 0222  HGB -- -- -- 15.6 15.6  HCT -- -- -- 46.0 43.2  PLT -- -- -- -- 159  APTT -- -- -- -- --  LABPROT -- -- -- -- --  INR -- -- -- -- --  HEPARINUNFRC 0.24* -- -- -- --  CREATININE -- -- 1.06 1.20 --  CKTOTAL -- -- -- -- --  CKMB -- -- -- -- --  TROPONINI -- <0.30 <0.30 -- --    Estimated Creatinine Clearance: 82.4 ml/min (by C-G formula based on Cr of 1.06).   Medical History: Past Medical History  Diagnosis Date  . Pneumonia     Medications:  No prescriptions prior to admission   Scheduled:     . azithromycin  500 mg Oral Q24H  . cefTRIAXone (ROCEPHIN)  IV  1 g Intravenous Q24H  . [COMPLETED] heparin  4,000 Units Intravenous Once  . [COMPLETED]  morphine injection  4 mg Intravenous Once  . [COMPLETED] moxifloxacin  400 mg Intravenous Once  . [COMPLETED] ondansetron  4 mg Intravenous Once  . [COMPLETED] oxyCODONE-acetaminophen  2 tablet Oral Once  . pantoprazole  40 mg Oral Q1200  . [COMPLETED] patient's guide to using coumadin book   Does not apply Once  . warfarin  10 mg Oral ONCE-1800  . [COMPLETED] warfarin   Does not apply Once  . Warfarin - Pharmacist Dosing Inpatient   Does not apply q1800  . [DISCONTINUED] enoxaparin  40 mg Subcutaneous Q24H   Infusions:     . sodium chloride 100 mL/hr at 01/07/12 0738  . heparin 1,200 Units/hr (01/07/12 0737)    Assessment: 51yo male on heparin and coumadin for PE.  Hypercoag panels in progress. Heparin level is subtherapeutic.  No bleeding reported.   Goal of  Therapy:  Heparin level 0.3-0.7 Monitor platelets by anticoagulation protocol: Yes   Plan:  Heparin bolus 1000 units x 1, then increase heparin drip to 1350 units/hr.   Check heparin level 6 hours after rate change. Daily heparin level/cbc.  Wendie Simmer, PharmD, BCPS Clinical Pharmacist  Pager: [phone]

## 2012-01-07 NOTE — H&P (Signed)
PCP:  none   Chief Complaint:   Chest pain  HPI: Daniel Glover is a 51 y.o. male   has a past medical history of Pneumonia.   Presented with  He woke up with pleuritic chest pain and shortness of breath. CXR worrisome for PNA. Pain is on the left side of the chest. Denies any fever. NO cough. He was mildly hypoxic on RA down to 92%.  No recent trips. No leg swelling. Denies any weight loss or night sweats. Patient states that he have had similar presentations in the past but the chest pain have been not as severe.   Review of Systems:     Pertinent positives include:shortness of breath at rest.  chest pain  Constitutional:  No weight loss, night sweats, Fevers, chills, fatigue, weight loss  HEENT:  No headaches, Difficulty swallowing,Tooth/dental problems,Sore throat,  No sneezing, itching, ear ache, nasal congestion, post nasal drip,  Cardio-vascular:  No Orthopnea, PND, anasarca, dizziness, palpitations.no Bilateral lower extremity swelling  GI:  No heartburn, indigestion, abdominal pain, nausea, vomiting, diarrhea, change in bowel habits, loss of appetite, melena, blood in stool, hematemesis Resp:  no No dyspnea on exertion, No excess mucus, no productive cough, No non-productive cough, No coughing up of blood.No change in color of mucus.No wheezing. Skin:  no rash or lesions. No jaundice GU:  no dysuria, change in color of urine, no urgency or frequency. No straining to urinate.  No flank pain.  Musculoskeletal:  No joint pain or no joint swelling. No decreased range of motion. No back pain.  Psych:  No change in mood or affect. No depression or anxiety. No memory loss.  Neuro: no localizing neurological complaints, no tingling, no weakness, no double vision, no gait abnormality, no slurred speech, no confusion  Otherwise ROS are negative except for above, 10 systems were reviewed  Past Medical History: Past Medical History  Diagnosis Date  . Pneumonia    Past  Surgical History  Procedure Date  . Tonsillectomy   . Appendectomy      Medications: Prior to Admission medications   Not on File    Allergies:  No Known Allergies  Social History:  Ambulatory   independently   Lives at   Home with family   reports that he has been smoking Cigarettes.  He has a 15 pack-year smoking history. He has never used smokeless tobacco. He reports that he drinks alcohol. He reports that he does not use illicit drugs.   Family History: family history includes Bleeding Disorder in his father; Hypertension in his brother; and Lupus in his mother.    Physical Exam: Patient Vitals for the past 24 hrs:  BP Temp Temp src Pulse Resp SpO2 Height Weight  01/07/12 0336 114/68 mmHg 99.7 F (37.6 C) Oral 96  19  96 % 5\' 6"  (1.676 m) 77.111 kg (170 lb)  01/07/12 0200 138/69 mmHg - - 86  - 98 % - -  01/07/12 0130 137/61 mmHg - - 91  - 97 % - -  01/07/12 0117 142/79 mmHg - - 93  19  92 % - -  01/07/12 0116 - 99.4 F (37.4 C) Oral - - - - -    1. General:  in No Acute distress 2. Psychological: Alert and  Oriented 3. Head/ENT:   Moist  Mucous Membranes                          Head Non  traumatic, neck supple                          Normal   Dentition 4. SKIN: normal   Skin turgor,  Skin clean Dry and intact no rash 5. Heart: Regular rate and rhythm no Murmur, Rub or gallop 6. Lungs:no wheezes or crackles  Very shallow breaths 7. Abdomen: Soft, non-tender, Non distended 8. Lower extremities: no clubbing, cyanosis, or edema 9. Neurologically Grossly intact, moving all 4 extremities equally 10. MSK: Normal range of motion  body mass index is 27.44 kg/(m^2).   Labs on Admission:   [facility] 01/07/12 0235  NA 138  K 3.9  CL 103  CO2 --  GLUCOSE 114*  BUN 9  CREATININE 1.20  CALCIUM --  MG --  PHOS --   No results found for this basename: AST:2,ALT:2,ALKPHOS:2,BILITOT:2,PROT:2,ALBUMIN:2 in the last 72 hours No results found for this basename:  LIPASE:2,AMYLASE:2 in the last 72 hours  Basename 01/07/12 0235 01/07/12 0222  WBC -- 13.6*  NEUTROABS -- --  HGB 15.6 15.6  HCT 46.0 43.2  MCV -- 86.6  PLT -- 159   No results found for this basename: CKTOTAL:3,CKMB:3,CKMBINDEX:3,TROPONINI:3 in the last 72 hours No results found for this basename: TSH,T4TOTAL,FREET3,T3FREE,THYROIDAB in the last 72 hours No results found for this basename: VITAMINB12:2,FOLATE:2,FERRITIN:2,TIBC:2,IRON:2,RETICCTPCT:2 in the last 72 hours No results found for this basename: HGBA1C    Estimated Creatinine Clearance: 72.8 ml/min (by C-G formula based on Cr of 1.2). ABG    Component Value Date/Time   HCO3 32.6* 05/18/2007 1118   TCO2 25 01/07/2012 0235     No results found for this basename: DDIMER     Cultures: No results found for this basename: sdes, specrequest, cult, reptstatus       Radiological Exams on Admission: Dg Chest 2 View  01/07/2012  *RADIOLOGY REPORT*  Clinical Data: Shortness of breath and left chest pain.  CHEST - 2 VIEW  Comparison: Chest radiograph performed 10/28/2011  Findings: The lungs are well-aerated.  Mild focal left lower lobe opacity is compatible with pneumonia.  There is no evidence of pleural effusion or pneumothorax.  The heart is normal in size; the mediastinal contour is within normal limits.  No acute osseous abnormalities are seen.  IMPRESSION: Mild left lower lobe pneumonia noted.   Original Report Authenticated By: Tonia Ghent, M.D.     Chart has been reviewed  Assessment/Plan  52 yo M w his 3rd episode of pneumonia. And severe pleuritic chest pain.   Present on Admission:  . CAP (community acquired pneumonia) - will obtain CT of the chest to evaluate lung parenchyma given recurrent episodes. Also obtain HIV serologies. Cover with rocephin and azithromycin . Chest pain on breathing - given his presentation will obtain CT of the chest to RO PE.   Prophylaxis:   Lovenox, Protonix  CODE STATUS: FULL  CODE  Other plan as per orders.  I have spent a total of 55 min on this admission  Elanore Talcott 01/07/2012, 4:19 AM

## 2012-01-08 DIAGNOSIS — I82409 Acute embolism and thrombosis of unspecified deep veins of unspecified lower extremity: Secondary | ICD-10-CM

## 2012-01-08 DIAGNOSIS — J189 Pneumonia, unspecified organism: Secondary | ICD-10-CM

## 2012-01-08 DIAGNOSIS — I2699 Other pulmonary embolism without acute cor pulmonale: Principal | ICD-10-CM

## 2012-01-08 DIAGNOSIS — R071 Chest pain on breathing: Secondary | ICD-10-CM

## 2012-01-08 DIAGNOSIS — I5189 Other ill-defined heart diseases: Secondary | ICD-10-CM

## 2012-01-08 LAB — LUPUS ANTICOAGULANT PANEL
DRVVT: 47.6 secs — ABNORMAL HIGH (ref <42.9)
Lupus Anticoagulant: NOT DETECTED
PTT Lupus Anticoagulant: 40.9 secs (ref 28.0–43.0)
dRVVT Incubated 1:1 Mix: 39.2 secs (ref <42.9)

## 2012-01-08 LAB — BASIC METABOLIC PANEL
BUN: 12 mg/dL (ref 6–23)
CO2: 25 mEq/L (ref 19–32)
Calcium: 8.7 mg/dL (ref 8.4–10.5)
Chloride: 102 mEq/L (ref 96–112)
Creatinine, Ser: 1.04 mg/dL (ref 0.50–1.35)
GFR calc Af Amer: >90 mL/min (ref >90)
GFR calc non Af Amer: 83 mL/min — ABNORMAL LOW (ref >90)
Glucose, Bld: 85 mg/dL (ref 70–99)
Potassium: 3.8 mEq/L (ref 3.5–5.1)
Sodium: 137 mEq/L (ref 135–145)

## 2012-01-08 LAB — CBC
HCT: 38.5 % — ABNORMAL LOW (ref 39.0–52.0)
Hemoglobin: 13.6 g/dL (ref 13.0–17.0)
MCH: 31.2 pg (ref 26.0–34.0)
MCHC: 35.3 g/dL (ref 30.0–36.0)
MCV: 88.3 fL (ref 78.0–100.0)
Platelets: 167 10*3/uL (ref 150–400)
RBC: 4.36 MIL/uL (ref 4.22–5.81)
RDW: 13.6 % (ref 11.5–15.5)
WBC: 10.7 10*3/uL — ABNORMAL HIGH (ref 4.0–10.5)

## 2012-01-08 LAB — PROTIME-INR
INR: 1.23 (ref 0.00–1.49)
Prothrombin Time: 15.3 seconds — ABNORMAL HIGH (ref 11.6–15.2)

## 2012-01-08 LAB — HEPARIN LEVEL (UNFRACTIONATED)
Heparin Unfractionated: 0.49 IU/mL (ref 0.30–0.70)
Heparin Unfractionated: 0.17 IU/mL — ABNORMAL LOW (ref 0.30–0.70)

## 2012-01-08 LAB — PROTEIN C ACTIVITY: Protein C Activity: 102 % (ref 75–133)

## 2012-01-08 MED ORDER — HEPARIN (PORCINE) IN NACL 100-0.45 UNIT/ML-% IJ SOLN
1500.0000 [IU]/h | INTRAMUSCULAR | Status: DC
  Administered 2012-01-08: 1750 [IU]/h via INTRAVENOUS
  Administered 2012-01-09: 1500 [IU]/h via INTRAVENOUS
  Administered 2012-01-09: 1750 [IU]/h via INTRAVENOUS
  Filled 2012-01-08 (×5): qty 250

## 2012-01-08 MED ORDER — WARFARIN SODIUM 10 MG PO TABS
10.0000 mg | ORAL_TABLET | Freq: Once | ORAL | Status: AC
  Administered 2012-01-08: 10 mg via ORAL
  Filled 2012-01-08: qty 1

## 2012-01-08 MED ORDER — HEPARIN BOLUS VIA INFUSION
2000.0000 [IU] | Freq: Once | INTRAVENOUS | Status: AC
  Administered 2012-01-08: 2000 [IU] via INTRAVENOUS
  Filled 2012-01-08: qty 2000

## 2012-01-08 NOTE — Progress Notes (Signed)
ANTICOAGULATION CONSULT NOTE - Follow Up Consult  Pharmacy Consult:  Heparin Indication:  PE + RLE DVT   No Known Allergies   Labs:  Basename 01/08/12 2043 01/08/12 1431 01/08/12 0537 01/07/12 1818 01/07/12 1127 01/07/12 0724 01/07/12 0235 01/07/12 0222  HGB -- -- 13.6 -- -- -- 15.6 --  HCT -- -- 38.5* -- -- -- 46.0 43.2  PLT -- -- 167 -- -- -- -- 159  APTT -- -- -- -- -- -- -- --  LABPROT -- -- 15.3* -- -- -- -- --  INR -- -- 1.23 -- -- -- -- --  HEPARINUNFRC 0.56 0.17* 0.49 -- -- -- -- --  CREATININE -- -- 1.04 -- -- 1.06 1.20 --  CKTOTAL -- -- -- -- -- -- -- --  CKMB -- -- -- -- -- -- -- --  TROPONINI -- -- -- <0.30 <0.30 <0.30 -- --    Estimated Creatinine Clearance: 84 ml/min (by C-G formula based on Cr of 1.04).   Assessment: 51 YOM c/o symptoms similar to prior PNA, found on CT to have prominent PE.  He also has a new DVT on RLE.    Heparin level therapeutic this evening  Goal of Therapy:  INR 2-3 HL 0.3-0.7 unit/mL Monitor platelets by anticoagulation protocol: Yes   Plan:  - Continue heparin drip at 1750 units / hr - Follow up AM heparin level  Thank you. Okey Regal, PharmD  01/08/2012, 9:20 PM

## 2012-01-08 NOTE — Progress Notes (Signed)
I have reviewed the Echocardiographic images and clearly see a mobile structure in the RA that seems to acutaly extend from the IVC up to the septal leaflet of the Tricuspid valve.  In the setting of known bilateral PEs, this is most likely thrombus.  There also appears to be a filling defect in the IVC that was not as well visualized.    I have discussed with Dr. Llana Aliment from Radiology who has reviewed the CTA images & was able to see a similar defect in these images, unfortunately the contrast was from an upper & not lower extremity making true deliniation of an IVC thrombus difficult. He has offered to add an addendum to the CTA report in the AM.   A mobile mass in this location is most likely thrombus, as it is not a usual location for atrial myxoma or other rare cardiac masses.  I discussed with Dr. Rennis Golden re: ? TEE -- he does not feel that performing an invasive procedure with sedation & potential airway compromise is a wise idea.  This modality could potentially get better visualization of the RA & IVC, but at the cost of an invasive procedure in a relatively unstable patient.    My recommendation is to presume that this is a thrombus & treat accordingly.  Marykay Lex, M.D., M.S. [facility] [address] [place], [place]  [address]  [phone] Pager # [phone] 01/08/2012 8:46 PM

## 2012-01-08 NOTE — Progress Notes (Signed)
  Echocardiogram 2D Echocardiogram has been performed.  Daniel Glover FRANCES 01/08/2012, 10:48 AM

## 2012-01-08 NOTE — Progress Notes (Addendum)
ANTICOAGULATION CONSULT NOTE - Follow Up Consult  Pharmacy Consult:  Heparin / Lovenox (Overlap #2/5) Indication:  PE + RLE DVT   No Known Allergies  Patient Measurements: Height: 5\' 6"  (167.6 cm) Weight: 170 lb (77.111 kg) IBW/kg (Calculated) : 63.8  Heparin Dosing Weight: 77kg  Vital Signs: Temp: 99.5 F (37.5 C) (11/11 0600) BP: 123/67 mmHg (11/11 0600) Pulse Rate: 64  (11/11 0600)  Labs:  Basename 01/08/12 0537 01/07/12 2040 01/07/12 1818 01/07/12 1259 01/07/12 1127 01/07/12 0724 01/07/12 0235 01/07/12 0222  HGB 13.6 -- -- -- -- -- 15.6 --  HCT 38.5* -- -- -- -- -- 46.0 43.2  PLT 167 -- -- -- -- -- -- 159  APTT -- -- -- -- -- -- -- --  LABPROT 15.3* -- -- -- -- -- -- --  INR 1.23 -- -- -- -- -- -- --  HEPARINUNFRC 0.49 0.27* -- 0.24* -- -- -- --  CREATININE 1.04 -- -- -- -- 1.06 1.20 --  CKTOTAL -- -- -- -- -- -- -- --  CKMB -- -- -- -- -- -- -- --  TROPONINI -- -- <0.30 -- <0.30 <0.30 -- --    Estimated Creatinine Clearance: 84 ml/min (by C-G formula based on Cr of 1.04).      Assessment: 51 YOM c/o symptoms similar to prior PNA, found on CT to have prominent PE.  He also has a new DVT on RLE.  Patient continues on Coumadin and IV heparin.  INR remains subtherapeutic as expected post Coumadin initiation last night.  HL was therapeutic this AM and confirmation level pending.  No bleeding reported.   Goal of Therapy:  INR 2-3 HL 0.3-0.7 unit/mL Monitor platelets by anticoagulation protocol: Yes     Plan:  - Heparin gtt at 1500 units/hr - Repeat Coumadin 10mg  PO today - Daily HL / CBC / PT / INR - F/U confirmation HL    Jacki Couse D. Laney Potash, PharmD, BCPS Pager:  [phone] 01/08/2012, 2:26 PM  =============================================  Addendum:  Confirmation HL subtherapeutic at 0.17 units/mL, likely d/t clot burden.  No complications with infusion per RN.   No bleeding. - Will give heparin 2000 units IV bolus x 1, then increase gtt to 1750  units/hr - Check 6 hr HL post rate change    Vivica Dobosz D. Laney Potash, PharmD, BCPS Pager:  [phone] 01/08/2012, 3:24 PM

## 2012-01-08 NOTE — Progress Notes (Signed)
Patient ID: Daniel Glover  male  ZOX:[id number]    DOB: [birth year]    DOA: 01/07/2012  PCP: Sheila Oats, MD  Assessment/Plan: Principal Problem:  *Acute Bilateral PE (pulmonary embolism) with Rt LE DVT and large mobile mass/thrombus in right atrium :  - on heparin drip and Coumadin, hemodynamically stable - Hypercoagulable panel in progress - 2D Echo done today shows large mobile mass/thrombus in right atrium ?myxoma vs clot, discussed with cardiology (Dr Herbie Baltimore) for possible TEE. He will also talk to radiology if cardiac MRI or CT is better.    - Will transfer patient to step-down unit, appreciate CCM-pulm assistance.  Active Problems:  CAP (community acquired pneumonia): Appears more like pulmonary infarction with atelectasis - Continue IV Rocephin and Zithromax for now, incentive spirometry, O2   Pleuritic Chest Pain  - Add oxycodone and IV Dilaudid   Acute DVT Right LE: As #1  DVT Prophylaxis: On heparin drip and Coumadin  Code Status: Full code  Disposition: Given the high clot burden and bilateral PEs, will continue heparin drip with Coumadin, transfer to SDU  Subjective: Pleuritic pain improving, no nausea, vomiting, abdominal pain   Objective: Weight change:   Intake/Output Summary (Last 24 hours) at 01/08/12 1430 Last data filed at 01/08/12 0800  Gross per 24 hour  Intake 1240.92 ml  Output   2250 ml  Net -1009.08 ml   Blood pressure 123/67, pulse 64, temperature 99.5 F (37.5 C), temperature source Oral, resp. rate 20, height 5\' 6"  (1.676 m), weight 77.111 kg (170 lb), SpO2 94.00%.  Physical Exam: General: Alert and awake, oriented x3, not in any acute distress. HEENT: anicteric sclera, pupils reactive to light and accommodation, EOMI CVS: S1-S2 clear, no murmur rubs or gallops Chest: clear to auscultation bilaterally, no wheezing, rales or rhonchi Abdomen: soft nontender, nondistended, normal bowel sounds, no organomegaly Extremities: no cyanosis,  clubbing or edema noted bilaterally Neuro: Cranial nerves II-XII intact, no focal neurological deficits  Lab Results: Basic Metabolic Panel:  Lab 01/08/12 8119 01/07/12 0724  NA 137 138  K 3.8 4.2  CL 102 102  CO2 25 26  GLUCOSE 85 105*  BUN 12 10  CREATININE 1.04 1.06  CALCIUM 8.7 9.0  MG -- --  PHOS -- --   Liver Function Tests:  Lab 01/07/12 0724  AST 20  ALT 20  ALKPHOS 71  BILITOT 1.2  PROT 7.0  ALBUMIN 3.3*  CBC:  Lab 01/08/12 0537 01/07/12 0235 01/07/12 0222  WBC 10.7* -- 13.6*  NEUTROABS -- -- --  HGB 13.6 15.6 --  HCT 38.5* 46.0 --  MCV 88.3 -- 86.6  PLT 167 -- 159   Cardiac Enzymes:  Lab 01/07/12 1818 01/07/12 1127 01/07/12 0724  CKTOTAL -- -- --  CKMB -- -- --  CKMBINDEX -- -- --  TROPONINI <0.30 <0.30 <0.30    Studies/Results: Dg Chest 2 View  01/07/2012  *RADIOLOGY REPORT*  Clinical Data: Shortness of breath and left chest pain.  CHEST - 2 VIEW  Comparison: Chest radiograph performed 10/28/2011  Findings: The lungs are well-aerated.  Mild focal left lower lobe opacity is compatible with pneumonia.  There is no evidence of pleural effusion or pneumothorax.  The heart is normal in size; the mediastinal contour is within normal limits.  No acute osseous abnormalities are seen.  IMPRESSION: Mild left lower lobe pneumonia noted.   Original Report Authenticated By: Tonia Ghent, M.D.    Ct Angio Chest Pe W/cm &/or Wo Cm  01/07/2012  IMPRESSION:  1.  Prominent pulmonary embolus within pulmonary arteries to all lobes of both lungs.  Greater clot burden noted on the right, with pulmonary embolus in the right main pulmonary artery, extending into segmental and subsegmental branches.  More distal left sided pulmonary emboli seen. 2.  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  Critical Value/emergent results were  called by telephone at the time of interpretation on 01/07/2012 at 05:59 a.m. to Dr. Sunnie Nielsen, who verbally acknowledged these results.   Original Report Authenticated By: Tonia Ghent, M.D.     Medications: Scheduled Meds:    . azithromycin  500 mg Oral Q24H  . cefTRIAXone (ROCEPHIN)  IV  1 g Intravenous Q24H  . [COMPLETED] heparin  1,500 Units Intravenous Once  . pantoprazole  40 mg Oral Q1200  . [COMPLETED] warfarin  10 mg Oral ONCE-1800  . warfarin  10 mg Oral ONCE-1800  . Warfarin - Pharmacist Dosing Inpatient   Does not apply q1800      LOS: 1 day   Sweetie Giebler M.D. [facility] 01/08/2012, 2:30 PM Pager: [phone]  If 7PM-7AM, please contact night-coverage [email or web address] Password [email or web address]

## 2012-01-08 NOTE — Consult Note (Signed)
PULMONARY  / CRITICAL CARE MEDICINE  Name: Daniel Glover MRN: [id number] DOB: 02-01-1961    LOS: 1  REFERRING PROVIDER:   Rai CHIEF COMPLAINT:   PE  BRIEF PATIENT DESCRIPTION:  51 year old male admitted on 11/10 with acute RLE DVT, R>L pulmonary emboli (w/ large clot burden), and Echo concerning for R apical thrombus.   SIGNIFICANT EVENTS:  CT chest 11/11: 1. Prominent pulmonary embolus within pulmonary arteries to all lobes of both lungs. Greater clot burden noted on the right, with pulmonary embolus in the right main pulmonary artery, extending into segmental and subsegmental branches. More distal left sided pulmonary emboli seen.  2. Pulmonary infarcts of the left lingula and left lower lobe; mild right basilar airspace opacity may also reflect pulmonary  infarct, though associated blebs are of uncertain significance ECHO 11/11: - Left ventricle: The cavity size was normal. Systolicfunction was normal. The estimated ejection fraction was in the range of 55% to 60%. Wall motion was normal; therewere no regional wall motion abnormalities. - Right ventricle: The cavity size was mildly dilated. Wall thickness was normal.Right atrium: There is a mobile density in the RA of unclear etiology   HISTORY OF PRESENT ILLNESS:   51 year old male who was admitted w/ abrupt onset of left sided chest pain, and dyspnea on 11/10. Dx eval demonstrated RLE DVT and extensive pulmonary emboli R>L, w/ echo mentioning concern for right atrial thrombus. On further questioning he was noting RLE pain about 3 weeks prior. Noted that this had resolved prior to his new acute onset. He notes chronic dyspnea over last year from what was treated as presumed pneumonias. He notes that he has had decreased activity tolerance since these events and has never really returned to baseline. He denies recent travel, trauma, new medications or prolonged immobility. PCCM asked to eval given concern about right atrial thrombus.    PAST MEDICAL HISTORY :  Past Medical History  Diagnosis Date  . Pneumonia    Past Surgical History  Procedure Date  . Tonsillectomy   . Appendectomy    Prior to Admission medications   Not on File   No Known Allergies  FAMILY HISTORY:  Family History  Problem Relation Age of Onset  . Lupus Mother   . Bleeding Disorder Father   . Hypertension Brother    SOCIAL HISTORY:  reports that he has been smoking Cigarettes.  He has a 15 pack-year smoking history. He has never used smokeless tobacco. He reports that he drinks alcohol. He reports that he does not use illicit drugs.  REVIEW OF SYSTEMS:   Constitutional: Negative for fever, chills, weight loss, malaise/fatigue and diaphoresis.  HENT: Negative for hearing loss, ear pain, nosebleeds, congestion, sore throat, neck pain, tinnitus and ear discharge.   Eyes: Negative for blurred vision, double vision, photophobia, pain, discharge and redness.  Respiratory: Negative for cough, hemoptysis, - sputum production, + shortness of breath,- wheezing and stridor.  + left sided CP Cardiovascular: Negative for chest pain, palpitations, orthopnea, claudication, leg swelling and PND.  Gastrointestinal: Negative for heartburn, nausea, vomiting, abdominal pain, diarrhea, constipation, blood in stool and melena.  Genitourinary: Negative for dysuria, urgency, frequency, hematuria and flank pain.  Musculoskeletal: Negative for myalgias, back pain, joint pain and falls.  Skin: Negative for itching and rash.  Neurological: Negative for dizziness, tingling, tremors, sensory change, speech change, focal weakness, seizures, loss of consciousness, weakness and headaches.  Endo/Heme/Allergies: Negative for environmental allergies and polydipsia. Does not bruise/bleed easily.  INTERVAL  HISTORY:   VITAL SIGNS: Temp:  [98.9 F (37.2 C)-99.5 F (37.5 C)] 99.5 F (37.5 C) (11/11 0600) Pulse Rate:  [63-71] 63  (11/11 1500) Resp:  [17-20] 20  (11/11  0600) BP: (120-129)/(66-67) 129/66 mmHg (11/11 1500) SpO2:  [93 %-94 %] 94 % (11/11 1500)  PHYSICAL EXAMINATION: General:  Well developed AAM currently in no acute distress Neuro:  intact HEENT:  intact Cardiovascular:  rrr Lungs:  Clear  Abdomen:  Soft non-tender  Musculoskeletal:  Intact  Skin:  Intact    Lab 01/08/12 0537 01/07/12 0724 01/07/12 0235  NA 137 138 138  K 3.8 4.2 3.9  CL 102 102 103  CO2 25 26 --  BUN 12 10 9   CREATININE 1.04 1.06 1.20  GLUCOSE 85 105* 114*    Lab 01/08/12 0537 01/07/12 0235 01/07/12 0222  HGB 13.6 15.6 15.6  HCT 38.5* 46.0 43.2  WBC 10.7* -- 13.6*  PLT 167 -- 159   Dg Chest 2 View  01/07/2012  *RADIOLOGY REPORT*  Clinical Data: Shortness of breath and left chest pain.  CHEST - 2 VIEW  Comparison: Chest radiograph performed 10/28/2011  Findings: The lungs are well-aerated.  Mild focal left lower lobe opacity is compatible with pneumonia.  There is no evidence of pleural effusion or pneumothorax.  The heart is normal in size; the mediastinal contour is within normal limits.  No acute osseous abnormalities are seen.  IMPRESSION: Mild left lower lobe pneumonia noted.   Original Report Authenticated By: Tonia Ghent, M.D.    Ct Angio Chest Pe W/cm &/or Wo Cm  01/07/2012  *RADIOLOGY REPORT*  Clinical Data: Left-sided pleuritic chest pain and shortness of breath.  CT ANGIOGRAPHY CHEST  Technique:  Multidetector CT imaging of the chest using the standard protocol during bolus administration of intravenous contrast. Multiplanar reconstructed images including MIPs were obtained and reviewed to evaluate the vascular anatomy.  Contrast: OMNIPAQUE IOHEXOL 350 MG/ML SOLN  Comparison: Chest radiograph performed earlier today at 02:35 a.m.  Findings: There is prominent pulmonary embolus within pulmonary arteries to all lobes of both lungs.  There is greater clot burden on the right, with pulmonary embolus noted in the right main pulmonary artery,  extending into segmental and subsegmental branches.  More distal left sided pulmonary emboli are seen.  Airspace opacification within the left lingula and left lower lobe is compatible with pulmonary infarct.  Mild right basilar airspace opacity may also reflect pulmonary infarct, though there is some degree of associated bleb formation, of uncertain significance. There is no evidence of pleural effusion or pneumothorax.  No masses are identified; no abnormal focal contrast enhancement is seen.  The mediastinum is unremarkable in appearance.  No mediastinal lymphadenopathy is seen.  No pericardial effusion is identified. The great vessels are unremarkable in appearance.  No axillary lymphadenopathy is seen.  The visualized portions of the thyroid gland are unremarkable in appearance.  The visualized portions of the liver and spleen are unremarkable. The visualized portions of the pancreas, stomach, adrenal glands and kidneys are within normal limits.  No acute osseous abnormalities are seen.  IMPRESSION:  1.  Prominent pulmonary embolus within pulmonary arteries to all lobes of both lungs.  Greater clot burden noted on the right, with pulmonary embolus in the right main pulmonary artery, extending into segmental and subsegmental branches.  More distal left sided pulmonary emboli seen. 2.  Pulmonary infarcts of the left lingula and left lower lobe; mild right basilar airspace opacity may also reflect pulmonary  infarct, though associated blebs are of uncertain significance.  Critical Value/emergent results were called by telephone at the time of interpretation on 01/07/2012 at 05:59 a.m. to Dr. Sunnie Nielsen, who verbally acknowledged these results.   Original Report Authenticated By: Tonia Ghent, M.D.     ASSESSMENT / PLAN:  1) Acute PE/DVT w/ pulmonary infarct.  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 Pt does have family h/o Lupus (mother) and father died of some  blood dyscrasia he was uncertain of details. He has clinically improved, however his largest concern as of now is the right atrial thrombus and risk of further PE in the near future. Currently not a candidate for thrombolytics as he is improving, and there is evidence of pulmonary infarct, however should he develop acute distress this may be required.  Recommendation: Cont heparin, hold coumadin in case he needs thrombolytics.  F/u on hypercoagulable panel: suspect he will require life long coumadin Cardiology in-put would be helpful.  Might benefit from TEE to see if the finding on TTE actually reflect clot.  Cont analgesia and oxygen F/u cxr  Close obs, agree w/ transfer to the SDU setting.    Pulmonary and Critical Care Medicine [facility] Pager: [phone]  01/08/2012, 3:34 PM  Will transfer to SDU and continue anticoagulation with heparin, hold coumadin and obtain a TEE, if it is truly a thrombus in the RA then will start coumadin, if it is a myxoma or another body might potentially need surgical intervention.  Will monitor for now.  Patient seen and examined, agree with above note.  I dictated the care and orders written for this patient under my direction.  Koren Bound, M.D. [phone]

## 2012-01-08 NOTE — Progress Notes (Signed)
ANTICOAGULATION CONSULT NOTE - Follow Up Consult  Pharmacy Consult for heparin Indication: pulmonary embolus  Labs:  Basename 01/08/12 0537 01/07/12 2040 01/07/12 1818 01/07/12 1259 01/07/12 1127 01/07/12 0724 01/07/12 0235 01/07/12 0222  HGB 13.6 -- -- -- -- -- 15.6 --  HCT 38.5* -- -- -- -- -- 46.0 43.2  PLT 167 -- -- -- -- -- -- 159  APTT -- -- -- -- -- -- -- --  LABPROT 15.3* -- -- -- -- -- -- --  INR 1.23 -- -- -- -- -- -- --  HEPARINUNFRC 0.49 0.27* -- 0.24* -- -- -- --  CREATININE -- -- -- -- -- 1.06 1.20 --  CKTOTAL -- -- -- -- -- -- -- --  CKMB -- -- -- -- -- -- -- --  TROPONINI -- -- <0.30 -- <0.30 <0.30 -- --    Assessment/Plan:  51yo male now therapeutic on heparin after rate increases for PE.  Will continue gtt at current rate and confirm stable with additional level.  Colleen Can PharmD BCPS 01/08/2012,7:18 AM

## 2012-01-09 ENCOUNTER — Encounter (HOSPITAL_COMMUNITY): Payer: Self-pay | Admitting: *Deleted

## 2012-01-09 DIAGNOSIS — I80299 Phlebitis and thrombophlebitis of other deep vessels of unspecified lower extremity: Secondary | ICD-10-CM

## 2012-01-09 DIAGNOSIS — I513 Intracardiac thrombosis, not elsewhere classified: Secondary | ICD-10-CM | POA: Diagnosis present

## 2012-01-09 LAB — CBC
MCH: 30.6 pg (ref 26.0–34.0)
MCV: 86.4 fL (ref 78.0–100.0)
Platelets: 179 10*3/uL (ref 150–400)
RDW: 13.2 % (ref 11.5–15.5)
WBC: 8.2 10*3/uL (ref 4.0–10.5)

## 2012-01-09 LAB — BASIC METABOLIC PANEL
BUN: 12 mg/dL (ref 6–23)
CO2: 25 mEq/L (ref 19–32)
Calcium: 8.8 mg/dL (ref 8.4–10.5)
Chloride: 100 mEq/L (ref 96–112)
Creatinine, Ser: 0.97 mg/dL (ref 0.50–1.35)
GFR calc Af Amer: >90 mL/min (ref >90)
GFR calc non Af Amer: >90 mL/min (ref >90)
Glucose, Bld: 84 mg/dL (ref 70–99)
Potassium: 4 mEq/L (ref 3.5–5.1)
Sodium: 136 mEq/L (ref 135–145)

## 2012-01-09 LAB — CARDIOLIPIN ANTIBODIES, IGG, IGM, IGA: Anticardiolipin IgG: 7 GPL U/mL — ABNORMAL LOW (ref <23)

## 2012-01-09 LAB — PROTIME-INR: INR: 1.34 (ref 0.00–1.49)

## 2012-01-09 LAB — BETA-2-GLYCOPROTEIN I ABS, IGG/M/A: Beta-2-Glycoprotein I IgM: 0 M Units (ref <20)

## 2012-01-09 LAB — HEPARIN LEVEL (UNFRACTIONATED): Heparin Unfractionated: 0.75 IU/mL — ABNORMAL HIGH (ref 0.30–0.70)

## 2012-01-09 MED ORDER — WARFARIN SODIUM 10 MG PO TABS
10.0000 mg | ORAL_TABLET | Freq: Once | ORAL | Status: AC
  Administered 2012-01-09: 10 mg via ORAL
  Filled 2012-01-09: qty 1

## 2012-01-09 MED ORDER — HEPARIN (PORCINE) IN NACL 100-0.45 UNIT/ML-% IJ SOLN
1650.0000 [IU]/h | INTRAMUSCULAR | Status: DC
  Administered 2012-01-09 – 2012-01-10 (×2): 1400 [IU]/h via INTRAVENOUS
  Administered 2012-01-11: 1650 [IU]/h via INTRAVENOUS
  Administered 2012-01-11: 1500 [IU]/h via INTRAVENOUS
  Administered 2012-01-12 (×2): 1650 [IU]/h via INTRAVENOUS
  Filled 2012-01-09 (×8): qty 250

## 2012-01-09 MED ORDER — HYDROCOD POLST-CHLORPHEN POLST 10-8 MG/5ML PO LQCR
5.0000 mL | Freq: Three times a day (TID) | ORAL | Status: DC
  Administered 2012-01-09 – 2012-01-14 (×16): 5 mL via ORAL
  Filled 2012-01-09 (×16): qty 5

## 2012-01-09 MED ORDER — HYDROMORPHONE HCL PF 1 MG/ML IJ SOLN
1.0000 mg | INTRAMUSCULAR | Status: DC | PRN
  Administered 2012-01-09 – 2012-01-14 (×6): 1 mg via INTRAVENOUS
  Filled 2012-01-09 (×6): qty 1

## 2012-01-09 MED ORDER — OXYCODONE HCL 5 MG PO TABS
5.0000 mg | ORAL_TABLET | ORAL | Status: DC | PRN
  Administered 2012-01-09 – 2012-01-11 (×9): 10 mg via ORAL
  Administered 2012-01-11 (×2): 5 mg via ORAL
  Administered 2012-01-11 – 2012-01-14 (×9): 10 mg via ORAL
  Filled 2012-01-09 (×2): qty 2
  Filled 2012-01-09: qty 10
  Filled 2012-01-09 (×2): qty 2
  Filled 2012-01-09: qty 1
  Filled 2012-01-09 (×3): qty 2
  Filled 2012-01-09: qty 1
  Filled 2012-01-09: qty 2
  Filled 2012-01-09: qty 1
  Filled 2012-01-09 (×8): qty 2
  Filled 2012-01-09: qty 1
  Filled 2012-01-09: qty 2

## 2012-01-09 NOTE — Progress Notes (Signed)
ANTICOAGULATION CONSULT NOTE - Follow Up Consult  Pharmacy Consult:  Heparin Indication:  PE + RLE DVT   No Known Allergies   Labs:  Basename 01/09/12 1300 01/09/12 0432 01/08/12 2043 01/08/12 0537 01/07/12 1818 01/07/12 1127 01/07/12 0724 01/07/12 0235 01/07/12 0222  HGB -- 13.5 -- 13.6 -- -- -- -- --  HCT -- 38.1* -- 38.5* -- -- -- 46.0 --  PLT -- 179 -- 167 -- -- -- -- 159  APTT -- -- -- -- -- -- -- -- --  LABPROT -- 16.3* -- 15.3* -- -- -- -- --  INR -- 1.34 -- 1.23 -- -- -- -- --  HEPARINUNFRC 0.75* 0.90* 0.56 -- -- -- -- -- --  CREATININE -- 0.97 -- 1.04 -- -- 1.06 -- --  CKTOTAL -- -- -- -- -- -- -- -- --  CKMB -- -- -- -- -- -- -- -- --  TROPONINI -- -- -- -- <0.30 <0.30 <0.30 -- --    Estimated Creatinine Clearance: 90 ml/min (by C-G formula based on Cr of 0.97).   Assessment: 51 YOM c/o symptoms similar to prior PNA, found on CT to have prominent PE.  He also has a new DVT on RLE. Heparin level this am 0.90 and INR=1.34 on day 3 of coumadin.   Goal of Therapy:  INR 2-3 HL 0.3-0.7 unit/mL Monitor platelets by anticoagulation protocol: Yes  Plan:  -Decrease heparin to 1400 units/hr  -Coumadin 10mg  po today -Will recheck a heparin level in am and PT/INR daily  Harland German, Pharm D 01/09/2012 2:35 PM

## 2012-01-09 NOTE — Care Management Note (Addendum)
    Page 1 of 1   01/11/2012     1:53:27 PM   CARE MANAGEMENT NOTE 01/11/2012  Patient:  Daniel Glover, Daniel Glover   Account Number:  [id number]  Date Initiated:  01/08/2012  Documentation initiated by:  Junius Creamer  Subjective/Objective Assessment:   adm w pul embolus and pneumonia     Action/Plan:   lives w fam   Anticipated DC Date:     Anticipated DC Plan:        DC Planning Services  CM consult  Medication Assistance      Choice offered to / List presented to:             Status of service:   Medicare Important Message given?   (If response is "NO", the following Medicare IM given date fields will be blank) Date Medicare IM given:   Date Additional Medicare IM given:    Discharge Disposition:    Per UR Regulation:  Reviewed for med. necessity/level of care/duration of stay  If discussed at Long Length of Stay Meetings, dates discussed:    Comments:  11/14 13:48p debbie Sakai Wolford rn,bsn pt not elidg for clinic in [place], he has contacted [facility] and plans to establish there w pcp and for them to ck his labs.  11/13 10a debbie Yenni Carra rn,bsn went over again w pt need for pcp. he will call clinic in [place] and see if he can get appt w them or ck on [facility]. explained coumadin is 4.00 but importance of blood work on coumadin.  11/12 11:10a debbie Zyshonne Malecha rn,bsn gave pt list of poss clinics he may use if he wants to set up pcp, gave pt prescription card that may help w copays of brand name meds. no ins listed.  11/11 15:50p debbie Mann Sk[place] rn,bsn [phone]

## 2012-01-09 NOTE — Progress Notes (Addendum)
Patient ID: Flournoy Bayerl  male  AVW:[id number]    DOB: [birth year]    DOA: 01/07/2012  PCP: Sheila Oats, MD  Assessment/Plan: Principal Problem:  *Acute Bilateral PE (pulmonary embolism) with Rt LE DVT and large thrombus in right atrium :  - on heparin drip and Coumadin, hemodynamically stable, in SDU - Hypercoagulable panel shows elevated drvvt but lupus anticoagulant is negative, ordered cardiolipin Ab, B-2 glycoprotein Ab. Should we check tumor markers (?malignancy w/u).   - 2D Echo shows large thrombus in right atrium seems to extend from IVC upto septal leaflet of tricuspid valve, appreciate Dr Elissa Hefty recommendations, Is there any role for cath-directed thrombolysis (cardiac)? Consult placed with vascular surgery (Dr Edilia Bo to see patient). Will d/w CCM.  Active Problems:  CAP (community acquired pneumonia): Appears more like pulmonary infarction with atelectasis - Continue IV Rocephin and Zithromax for now, incentive spirometry, O2   Pleuritic Chest Pain  - oxycodone and IV Dilaudid increased, cont incentive spirometry   Acute DVT Right LE: As #1  DVT Prophylaxis: On heparin drip and Coumadin  Code Status: Full code  Disposition: Given the high clot burden and bilateral PEs, will continue heparin drip with Coumadin, remain in SDU  Subjective: Feels Pleuritic pain worse with coughing, no nausea, vomiting, abdominal pain   Objective: Weight change:   Intake/Output Summary (Last 24 hours) at 01/09/12 0809 Last data filed at 01/09/12 0700  Gross per 24 hour  Intake 3411.04 ml  Output   3575 ml  Net -163.96 ml   Blood pressure 111/64, pulse 65, temperature 97.8 F (36.6 C), temperature source Oral, resp. rate 19, height 5\' 6"  (1.676 m), weight 77.111 kg (170 lb), SpO2 95.00%.  Physical Exam: General: Alert and awake, oriented x3, not in any acute distress. HEENT: anicteric sclera, PERLA, EOMI CVS: S1-S2 clear, no murmur rubs or gallops Chest: CTAB, no wheezing,  rales or rhonchi Abdomen: soft NT, ND, NBS, no organomegaly Extremities: no cyanosis, clubbing or edema noted bilaterally   Lab Results: Basic Metabolic Panel:  Lab 01/09/12 8295 01/08/12 0537  NA 136 137  K 4.0 3.8  CL 100 102  CO2 25 25  GLUCOSE 84 85  BUN 12 12  CREATININE 0.97 1.04  CALCIUM 8.8 8.7  MG -- --  PHOS -- --   Liver Function Tests:  Lab 01/07/12 0724  AST 20  ALT 20  ALKPHOS 71  BILITOT 1.2  PROT 7.0  ALBUMIN 3.3*  CBC:  Lab 01/09/12 0432 01/08/12 0537  WBC 8.2 10.7*  NEUTROABS -- --  HGB 13.5 13.6  HCT 38.1* 38.5*  MCV 86.4 88.3  PLT 179 167   Cardiac Enzymes:  Lab 01/07/12 1818 01/07/12 1127 01/07/12 0724  CKTOTAL -- -- --  CKMB -- -- --  CKMBINDEX -- -- --  TROPONINI <0.30 <0.30 <0.30    Studies/Results: Dg Chest 2 View  01/07/2012  *RADIOLOGY REPORT*  Clinical Data: Shortness of breath and left chest pain.  CHEST - 2 VIEW  Comparison: Chest radiograph performed 10/28/2011  Findings: The lungs are well-aerated.  Mild focal left lower lobe opacity is compatible with pneumonia.  There is no evidence of pleural effusion or pneumothorax.  The heart is normal in size; the mediastinal contour is within normal limits.  No acute osseous abnormalities are seen.  IMPRESSION: Mild left lower lobe pneumonia noted.   Original Report Authenticated By: Tonia Ghent, M.D.    Ct Angio Chest Pe W/cm &/or Wo Cm  01/07/2012    IMPRESSION:  1.  Prominent pulmonary embolus within pulmonary arteries to all lobes of both lungs.  Greater clot burden noted on the right, with pulmonary embolus in the right main pulmonary artery, extending into segmental and subsegmental branches.  More distal left sided pulmonary emboli seen. 2.  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  Critical Value/emergent results were called by  telephone at the time of interpretation on 01/07/2012 at 05:59 a.m. to Dr. Sunnie Nielsen, who verbally acknowledged these results.   Original Report Authenticated By: Tonia Ghent, M.D.     Medications: Scheduled Meds:    . azithromycin  500 mg Oral Q24H  . cefTRIAXone (ROCEPHIN)  IV  1 g Intravenous Q24H  . chlorpheniramine-HYDROcodone  5 mL Oral Q8H  . [COMPLETED] heparin  2,000 Units Intravenous Once  . pantoprazole  40 mg Oral Q1200  . [COMPLETED] warfarin  10 mg Oral ONCE-1800  . Warfarin - Pharmacist Dosing Inpatient   Does not apply q1800      LOS: 2 days   Juanangel Soderholm M.D. [facility] 01/09/2012, 8:09 AM Pager: [phone]  If 7PM-7AM, please contact night-coverage [email or web address] Password [email or web address]

## 2012-01-09 NOTE — Consult Note (Signed)
[facility]  Patient name: Daniel Glover MRN: [id number] DOB: 04-07-1960 Sex: male  REASON FOR CONSULT: Right lower extremity DVT and bilateral pulmonary emboli. Also possibly clot and inferior vena cava  HPI: Daniel Glover is a 51 y.o. male was admitted on 01/07/2012 with chest pain and shortness of breath. The patient awoke early in the morning with the sudden onset of pleuritic chest pain. His workup included a chest CT which showed evidence of bilateral pulmonary emboli. Subsequent venous duplex scan showed a right lower extremity DVT. 2-D echo showed thrombus in the right atrium and also potentially in the IVC above the renal veins. Lesser surgery was contacted for further recommendations.  The patient denies any history of swelling in the right leg. He denies any previous history of DVT. He is unaware of any history of clotting problems in his family. He developed the acute onset of shortness of breath and chest pain on the morning of admission. He states that the chest pain has resolved and he is breathing much better at this point without significant shortness of breath. He denies any recent long travel. The only symptoms he can think potentially related to his right leg oddity was limping approximately 3 weeks ago but cannot remember which leg was bothering him.  Past Medical History  Diagnosis Date  . Pneumonia     Family History  Problem Relation Age of Onset  . Lupus Mother   . Bleeding Disorder Father   . Hypertension Brother     SOCIAL HISTORY: History  Substance Use Topics  . Smoking status: Current Every Day Smoker -- 1.0 packs/day for 15 years    Types: Cigarettes  . Smokeless tobacco: Never Used  . Alcohol Use: Yes     Comment: occassionally   No Known Allergies  REVIEW OF SYSTEMS: Arly.Keller ] denotes positive finding; [  ] denotes negative finding CARDIOVASCULAR:  Arly.Keller ] chest pain   [ ]  chest pressure   [ ]  palpitations   [ ]  orthopnea   Arly.Keller ]  dyspnea on exertion   [ ]  claudication   [ ]  rest pain   [ ]  DVT   [ ]  phlebitis PULMONARY:   [ ]  productive cough   [ ]  asthma   [ ]  wheezing NEUROLOGIC:   [ ]  weakness  [ ]  paresthesias  [ ]  aphasia  [ ]  amaurosis  [ ]  dizziness HEMATOLOGIC:   [ ]  bleeding problems   [ ]  clotting disorders MUSCULOSKELETAL:  [ ]  joint pain   [ ]  joint swelling [ ]  leg swelling GASTROINTESTINAL: [ ]   blood in stool  [ ]   hematemesis GENITOURINARY:  [ ]   dysuria  [ ]   hematuria PSYCHIATRIC:  [ ]  history of major depression INTEGUMENTARY:  [ ]  rashes  [ ]  ulcers CONSTITUTIONAL:  [ ]  fever   [ ]  chills  PHYSICAL EXAM: Filed Vitals:   01/09/12 0750 01/09/12 0800 01/09/12 1155 01/09/12 1157  BP: 111/64  94/76   Pulse: 65  81   Temp: 97.8 F (36.6 C)   97.7 F (36.5 C)  TempSrc: Oral   Oral  Resp: 19  16   Height:  5\' 6"  (1.676 m)    Weight:      SpO2: 95%  94%    Body mass index is 27.44 kg/(m^2). GENERAL: The patient is a well-nourished male, in no acute distress. The vital signs are documented above. CARDIOVASCULAR: There is a regular rate and rhythm.  He has palpable femoral, dorsalis pedis, and posterior tibial pulses bilaterally. There is no significant lower extremity swelling. PULMONARY: There is good air exchange bilaterally without wheezing or rales. ABDOMEN: Soft and non-tender with normal pitched bowel sounds.  MUSCULOSKELETAL: There are no major deformities or cyanosis. NEUROLOGIC: No focal weakness or paresthesias are detected. SKIN: There are no ulcers or rashes noted. PSYCHIATRIC: The patient has a normal affect.  DATA:  Lab Results  Component Value Date   WBC 8.2 01/09/2012   HGB 13.5 01/09/2012   HCT 38.1* 01/09/2012   MCV 86.4 01/09/2012   PLT 179 01/09/2012   Lab Results  Component Value Date   NA 136 01/09/2012   K 4.0 01/09/2012   CL 100 01/09/2012   CO2 25 01/09/2012   Lab Results  Component Value Date   CREATININE 0.97 01/09/2012   Lab Results  Component Value  Date   INR 1.34 01/09/2012   INR 1.23 01/08/2012   Chest CT: bilateral pulmonary emboli. The greatest clot burden is on the right side with pulmonary embolus in the right main pulmonary artery extending into segmental branches. The pulmonary emboli on the left side are more distal. In addition a filling defect in the medial aspect of the right atrium was noted possibly consistent with right atrial thrombus.  VENOUS DUPLEX: Shows acute DVT involving the right popliteal and distal right femoral veins. The right common femoral vein was patent and the tibial veins were patent. Also the right saphenofemoral junction and saphenous vein were patent. There was no DVT in the left lower extremity.  2-D ECHO: Systolic function with an ejection fraction of 55-60%. There was a mobile density in the right atrium consistent with possible thrombus. I also reviewed the study with Dr. Bryan Lemma, and he notes possible thrombus in the IVC above the level of the renal veins.  MEDICAL ISSUES: BILATERAL PULMONARY EMBOLI, RIGHT LOWER EXTREMITY DVT, RIGHT ATRIAL THROMBUS, AND POSSIBLE CLOT IN INFERIOR VENA CAVA BY 2-D ECHO: the patient is currently on heparin and has been started on Coumadin.  As there is no contraindication to anticoagulations I do not think that an IVC filter is indicated. In addition, given that there is clot in the inferior vena cava above the renal veins there would be no benefit in placing an IVC filter below this level, and there would be significant risk in dislodging clot in placing the filter. Currently there is no significant lower extremity swelling but it would be helpful to elevate his legs. He a hypercoagulable workup at some point. Vascular surgery we'll be available as needed.  DICKSON,CHRISTOPHER S [facility] [place] Beeper: [phone]

## 2012-01-09 NOTE — Consult Note (Signed)
PULMONARY  / CRITICAL CARE MEDICINE  Name: Daniel Glover MRN: [id number] DOB: 1960/06/08    LOS: 2  REFERRING PROVIDER:   Rai CHIEF COMPLAINT:   PE  BRIEF PATIENT DESCRIPTION:  51 year old male admitted on 11/10 with acute RLE DVT, R>L pulmonary emboli (w/ large clot burden), and Echo concerning for R apical thrombus.   SIGNIFICANT EVENTS:  CT chest 11/11: 1. Prominent pulmonary embolus within pulmonary arteries to all lobes of both lungs. Greater clot burden noted on the right, with pulmonary embolus in the right main pulmonary artery, extending into segmental and subsegmental branches. More distal left sided pulmonary emboli seen.  2. Pulmonary infarcts of the left lingula and left lower lobe; mild right basilar airspace opacity may also reflect pulmonary  infarct, though associated blebs are of uncertain significance ECHO 11/11: - Left ventricle: The cavity size was normal. Systolicfunction was normal. The estimated ejection fraction was in the range of 55% to 60%. Wall motion was normal; therewere no regional wall motion abnormalities. - Right ventricle: The cavity size was mildly dilated. Wall thickness was normal.Right atrium: There is a mobile density in the RA of unclear etiology   HISTORY OF PRESENT ILLNESS:   51 year old male who was admitted w/ abrupt onset of left sided chest pain, and dyspnea on 11/10. Dx eval demonstrated RLE DVT and extensive pulmonary emboli R>L, w/ echo mentioning concern for right atrial thrombus. On further questioning he was noting RLE pain about 3 weeks prior. Noted that this had resolved prior to his new acute onset. He notes chronic dyspnea over last year from what was treated as presumed pneumonias. He notes that he has had decreased activity tolerance since these events and has never really returned to baseline. He denies recent travel, trauma, new medications or prolonged immobility. PCCM asked to eval given concern about right atrial thrombus.    PAST MEDICAL HISTORY :  Past Medical History  Diagnosis Date  . Pneumonia    Past Surgical History  Procedure Date  . Tonsillectomy   . Appendectomy    Prior to Admission medications   Not on File   No Known Allergies  FAMILY HISTORY:  Family History  Problem Relation Age of Onset  . Lupus Mother   . Bleeding Disorder Father   . Hypertension Brother    SOCIAL HISTORY:  reports that he has been smoking Cigarettes.  He has a 15 pack-year smoking history. He has never used smokeless tobacco. He reports that he drinks alcohol. He reports that he does not use illicit drugs.  REVIEW OF SYSTEMS:   Constitutional: Negative for fever, chills, weight loss, malaise/fatigue and diaphoresis.  HENT: Negative for hearing loss, ear pain, nosebleeds, congestion, sore throat, neck pain, tinnitus and ear discharge.   Eyes: Negative for blurred vision, double vision, photophobia, pain, discharge and redness.  Respiratory: Negative for cough, hemoptysis, - sputum production, + shortness of breath,- wheezing and stridor.  + left sided CP Cardiovascular: Negative for chest pain, palpitations, orthopnea, claudication, leg swelling and PND.  Gastrointestinal: Negative for heartburn, nausea, vomiting, abdominal pain, diarrhea, constipation, blood in stool and melena.  Genitourinary: Negative for dysuria, urgency, frequency, hematuria and flank pain.  Musculoskeletal: Negative for myalgias, back pain, joint pain and falls.  Skin: Negative for itching and rash.  Neurological: Negative for dizziness, tingling, tremors, sensory change, speech change, focal weakness, seizures, loss of consciousness, weakness and headaches.  Endo/Heme/Allergies: Negative for environmental allergies and polydipsia. Does not bruise/bleed easily.  INTERVAL  HISTORY:   VITAL SIGNS: Temp:  [97.6 F (36.4 C)-99.7 F (37.6 C)] 97.7 F (36.5 C) (11/12 1157) Pulse Rate:  [61-114] 81  (11/12 1155) Resp:  [16-24] 16  (11/12  1155) BP: (94-143)/(62-76) 94/76 mmHg (11/12 1155) SpO2:  [92 %-97 %] 94 % (11/12 1155)  PHYSICAL EXAMINATION: General:  Well developed AAM currently in no acute distress Neuro:  intact HEENT:  intact Cardiovascular:  rrr Lungs:  Clear  Abdomen:  Soft non-tender  Musculoskeletal:  Intact  Skin:  Intact    Lab 01/09/12 0432 01/08/12 0537 01/07/12 0724  NA 136 137 138  K 4.0 3.8 4.2  CL 100 102 102  CO2 25 25 26   BUN 12 12 10   CREATININE 0.97 1.04 1.06  GLUCOSE 84 85 105*    Lab 01/09/12 0432 01/08/12 0537 01/07/12 0235 01/07/12 0222  HGB 13.5 13.6 15.6 --  HCT 38.1* 38.5* 46.0 --  WBC 8.2 10.7* -- 13.6*  PLT 179 167 -- 159   No results found.  ASSESSMENT / PLAN:  1) Acute PE/DVT w/ pulmonary infarct.  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 Pt does have family h/o Lupus (mother) and father died of some blood dyscrasia he was uncertain of details. He has clinically improved, however his largest concern as of now is the right atrial thrombus and risk of further PE in the near future. Currently not a candidate for thrombolytics as he is improving, and there is evidence of pulmonary infarct, however should he develop acute distress this may be required.  Spoke with cardiology, they are comfortable that the object seen echo is a blood clot not a mass. Recommendation: May start coumadin now no further interventions are necessary. F/u on hypercoagulable panel: suspect he will require life long coumadin. Cardiology in-put appreciated.  No need for TEE given conversation with cardiology and radiology that they feel it is a clot not a mass.  Cont analgesia and oxygen.  Once fully anticoagulated then will be ready for d/c with close follow up with primary and pulmonary and will need repeat echo in 4-6 wks.  Alyson Reedy, M.D. [facility] Pulmonary/Critical Care Medicine. Pager: [phone]. After hours pager: [phone].

## 2012-01-09 NOTE — Progress Notes (Signed)
ANTICOAGULATION CONSULT NOTE - Follow Up Consult  Pharmacy Consult:  Heparin Indication:  PE + RLE DVT   No Known Allergies   Labs:  Basename 01/09/12 0432 01/08/12 2043 01/08/12 1431 01/08/12 0537 01/07/12 1818 01/07/12 1127 01/07/12 0724 01/07/12 0235 01/07/12 0222  HGB 13.5 -- -- 13.6 -- -- -- -- --  HCT 38.1* -- -- 38.5* -- -- -- 46.0 --  PLT 179 -- -- 167 -- -- -- -- 159  APTT -- -- -- -- -- -- -- -- --  LABPROT 16.3* -- -- 15.3* -- -- -- -- --  INR 1.34 -- -- 1.23 -- -- -- -- --  HEPARINUNFRC 0.90* 0.56 0.17* -- -- -- -- -- --  CREATININE -- -- -- 1.04 -- -- 1.06 1.20 --  CKTOTAL -- -- -- -- -- -- -- -- --  CKMB -- -- -- -- -- -- -- -- --  TROPONINI -- -- -- -- <0.30 <0.30 <0.30 -- --    Estimated Creatinine Clearance: 84 ml/min (by C-G formula based on Cr of 1.04).   Assessment: 51 YOM c/o symptoms similar to prior PNA, found on CT to have prominent PE.  He also has a new DVT on RLE.    Heparin level this am 0.90 with no bleeding noted.  Goal of Therapy:  INR 2-3 HL 0.3-0.7 unit/mL Monitor platelets by anticoagulation protocol: Yes   Plan:  Decrease heparin to 1500 units/hr and recheck in 6 hours.

## 2012-01-10 LAB — HEPARIN LEVEL (UNFRACTIONATED): Heparin Unfractionated: 0.67 IU/mL (ref 0.30–0.70)

## 2012-01-10 LAB — PROTIME-INR: Prothrombin Time: 17.9 seconds — ABNORMAL HIGH (ref 11.6–15.2)

## 2012-01-10 LAB — BASIC METABOLIC PANEL
BUN: 12 mg/dL (ref 6–23)
Chloride: 99 mEq/L (ref 96–112)
GFR calc Af Amer: >90 mL/min (ref >90)
GFR calc non Af Amer: 86 mL/min — ABNORMAL LOW (ref >90)
Glucose, Bld: 83 mg/dL (ref 70–99)
Potassium: 4.1 mEq/L (ref 3.5–5.1)
Sodium: 136 mEq/L (ref 135–145)

## 2012-01-10 LAB — CARDIOLIPIN ANTIBODIES, IGG, IGM, IGA
Anticardiolipin IgA: 9 APL U/mL — ABNORMAL LOW (ref <22)
Anticardiolipin IgM: 0 MPL U/mL — ABNORMAL LOW (ref <11)

## 2012-01-10 LAB — CBC
HCT: 38.5 % — ABNORMAL LOW (ref 39.0–52.0)
Hemoglobin: 13.5 g/dL (ref 13.0–17.0)
MCH: 30.1 pg (ref 26.0–34.0)
MCHC: 35.1 g/dL (ref 30.0–36.0)
RBC: 4.48 MIL/uL (ref 4.22–5.81)

## 2012-01-10 MED ORDER — WARFARIN SODIUM 10 MG PO TABS
10.0000 mg | ORAL_TABLET | Freq: Once | ORAL | Status: AC
  Administered 2012-01-10: 10 mg via ORAL
  Filled 2012-01-10: qty 1

## 2012-01-10 NOTE — Progress Notes (Signed)
PULMONARY  / CRITICAL CARE MEDICINE  Name: Daniel Glover MRN: [id number] DOB: January 10, 1961    LOS: 3  REFERRING PROVIDER:   Isidoro Donning CHIEF COMPLAINT:   PE  BRIEF PATIENT DESCRIPTION:  51 year old male admitted on 11/10 with acute RLE DVT, R>L pulmonary emboli (w/ large clot burden), and Echo concerning for R apical thrombus.   SIGNIFICANT EVENTS:  CT chest 11/11: 1. Prominent pulmonary embolus within pulmonary arteries to all lobes of both lungs. Greater clot burden noted on the right, with pulmonary embolus in the right main pulmonary artery, extending into segmental and subsegmental branches. More distal left sided pulmonary emboli seen.  2. Pulmonary infarcts of the left lingula and left lower lobe; mild right basilar airspace opacity may also reflect pulmonary  infarct, though associated blebs are of uncertain significance ECHO 11/11: - Left ventricle: The cavity size was normal. Systolicfunction was normal. The estimated ejection fraction was in the range of 55% to 60%. Wall motion was normal; therewere no regional wall motion abnormalities. - Right ventricle: The cavity size was mildly dilated. Wall thickness was normal.Right atrium: There is a mobile density in the RA of unclear etiology   HISTORY OF PRESENT ILLNESS:   51 year old male who was admitted w/ abrupt onset of left sided chest pain, and dyspnea on 11/10. Dx eval demonstrated RLE DVT and extensive pulmonary emboli R>L, w/ echo mentioning concern for right atrial thrombus. On further questioning he was noting RLE pain about 3 weeks prior. Noted that this had resolved prior to his new acute onset. He notes chronic dyspnea over last year from what was treated as presumed pneumonias. He notes that he has had decreased activity tolerance since these events and has never really returned to baseline. He denies recent travel, trauma, new medications or prolonged immobility. PCCM asked to eval given concern about right atrial thrombus.    INTERVAL HISTORY:   VITAL SIGNS: Temp:  [97.5 F (36.4 C)-98.8 F (37.1 C)] 98.7 F (37.1 C) (11/13 1247) Pulse Rate:  [57-69] 69  (11/13 1247) Resp:  [17-20] 18  (11/13 0320) BP: (108-136)/(59-85) 136/73 mmHg (11/13 1100) SpO2:  [90 %-96 %] 95 % (11/13 1100) Weight:  [84.3 kg (185 lb 13.6 oz)] 84.3 kg (185 lb 13.6 oz) (11/12 1450)  PHYSICAL EXAMINATION: General:  Well developed AAM currently in no acute distress Neuro:  intact HEENT:  intact Cardiovascular:  Regular rate and regular rhythm  Lungs:  Clear to ausciltation Abdomen:  Soft non-tender  Musculoskeletal:  Intact  Skin:  Intact    Lab 01/10/12 0425 01/09/12 0432 01/08/12 0537  NA 136 136 137  K 4.1 4.0 3.8  CL 99 100 102  CO2 26 25 25   BUN 12 12 12   CREATININE 1.01 0.97 1.04  GLUCOSE 83 84 85    Lab 01/10/12 0425 01/09/12 0432 01/08/12 0537  HGB 13.5 13.5 13.6  HCT 38.5* 38.1* 38.5*  WBC 5.7 8.2 10.7*  PLT 195 179 167   No results found.  ASSESSMENT / PLAN:  1) Acute PE/DVT w/ pulmonary infarct.  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 Pt does have family h/o Lupus (mother) and father died of some blood dyscrasia he was uncertain of details. He has clinically improved, however his largest concern as of now is the right atrial thrombus and risk of further PE in the near future. Currently not a candidate for thrombolytics as he is improving, and there is evidence of pulmonary  infarct, however should he develop acute distress this may be required.  Spoke with cardiology, they are comfortable that the object seen echo is a blood clot not a mass. No TEE or IVC filter needed per cardiology/vascular.  Recommendation: Continue coumadin, dosed by pharmacy INR daily Will discontinue heparin once INR is in therapeutic range for 24 hours. Hypercoagulable panel after nine months on coumadin Cardiology and vascular in-put appreciated.  Will repeat Echo in 4-6  weeks. Cont analgesia and oxygen.  Alyson Reedy, M.D. [facility] Pulmonary/Critical Care Medicine. Pager: [phone]. After hours pager: [phone].

## 2012-01-10 NOTE — Progress Notes (Signed)
[facility] PROGRESS NOTE  Daniel Glover ZOX:[id number] DOB: 04/08/1960 DOA: 01/07/2012 PCP: Sheila Oats, MD  Assessment/Plan: *Acute Bilateral PE (pulmonary embolism) with Rt LE DVT and large thrombus in right atrium :  - on heparin drip and Coumadin, hemodynamically stable, in SDU. Target INR 2-3 - Hypercoagulable panel shows elevated drvvt but lupus anticoagulant is negative.  - B-2-glycoprotein and cardiolipin antibody in process - Tumor markers: AFP, CA 19-9, CEA, PSA negative. - 2D Echo shows large thrombus in right atrium seems to extend from IVC up to septal leaflet of tricuspid valve, appreciate Dr Elissa Hefty recommendations. - Cardiology, Vascular, PCCM on board.  Active Problems:  CAP (community acquired pneumonia): Appears more like pulmonary embolism with atelectasis  - Given recent findings his symptoms are most likely due to pulmonary embolism vs infectious etiology will plan on discontinuing antibiotics at this juncture.  Pleuritic Chest Pain  - oxycodone and IV Dilaudid PRN. Patient currently tolerating pain on this regimen  Acute DVT Right LE: As #1  Code Status: Full  Family Communication: No family at bedside. Disposition Plan: Will have care manager discuss costs of medication with patient and provide list of PCP's for patient to follow up with if he doesn't already have a pcp.  Once INR 2-3 will consider d/c if patient stable for discharge.    Consultants:  Vascular  PCCM  Cardiology  Procedures:  None  Antibiotics:  Started on Azithromycin and Rocephin.  Discontinued 01/10/12  HPI/Subjective: No new complaints today. Denies any weakness in lower extremities and has no new complaints of increased pain or difficulty breathing.  His major concern is that the oxygen is drying his nose out and if whether or not he will be able to afford his medication once discharged.  Objective: Filed Vitals:   01/09/12 2329 01/10/12 0320 01/10/12 0600  01/10/12 0814  BP:    108/78  Pulse: 61 58 57 68  Temp: 98.7 F (37.1 C) 98.6 F (37 C)  97.5 F (36.4 C)  TempSrc: Oral Oral  Oral  Resp: 18 18    Height:      Weight:      SpO2: 90% 95% 91% 94%    Intake/Output Summary (Last 24 hours) at 01/10/12 0946 Last data filed at 01/10/12 0400  Gross per 24 hour  Intake    645 ml  Output   1370 ml  Net   -725 ml   Filed Weights   01/07/12 0336 01/07/12 0625 01/09/12 1450  Weight: 77.111 kg (170 lb) 77.111 kg (170 lb) 84.3 kg (185 lb 13.6 oz)    Exam:   General:  Pt in NAD, Alert and Oriented x 3  Cardiovascular: RRR, No MRG  Respiratory: Decreased breath sounds at bases, no wheezes, Breath sounds BL  Abdomen: soft, NT, ND  Extremity: + dorsal pedis pulses BL, no cyanosis  Data Reviewed: Basic Metabolic Panel:  Lab 01/10/12 8119 01/09/12 0432 01/08/12 0537 01/07/12 0724 01/07/12 0235  NA 136 136 137 138 138  K 4.1 4.0 3.8 4.2 3.9  CL 99 100 102 102 103  CO2 26 25 25 26  --  GLUCOSE 83 84 85 105* 114*  BUN 12 12 12 10 9   CREATININE 1.01 0.97 1.04 1.06 1.20  CALCIUM 8.6 8.8 8.7 9.0 --  MG -- -- -- -- --  PHOS -- -- -- -- --   Liver Function Tests:  Lab 01/07/12 0724  AST 20  ALT 20  ALKPHOS 71  BILITOT 1.2  PROT 7.0  ALBUMIN 3.3*   No results found for this basename: LIPASE:5,AMYLASE:5 in the last 168 hours No results found for this basename: AMMONIA:5 in the last 168 hours CBC:  Lab 01/10/12 0425 01/09/12 0432 01/08/12 0537 01/07/12 0235 01/07/12 0222  WBC 5.7 8.2 10.7* -- 13.6*  NEUTROABS -- -- -- -- --  HGB 13.5 13.5 13.6 15.6 15.6  HCT 38.5* 38.1* 38.5* 46.0 43.2  MCV 85.9 86.4 88.3 -- 86.6  PLT 195 179 167 -- 159   Cardiac Enzymes:  Lab 01/07/12 1818 01/07/12 1127 01/07/12 0724  CKTOTAL -- -- --  CKMB -- -- --  CKMBINDEX -- -- --  TROPONINI <0.30 <0.30 <0.30   BNP (last 3 results) No results found for this basename: PROBNP:3 in the last 8760 hours CBG: No results found for this basename:  GLUCAP:5 in the last 168 hours  No results found for this or any previous visit (from the past 240 hour(s)).   Studies: No results found.  Scheduled Meds:   . azithromycin  500 mg Oral Q24H  . cefTRIAXone (ROCEPHIN)  IV  1 g Intravenous Q24H  . chlorpheniramine-HYDROcodone  5 mL Oral Q8H  . pantoprazole  40 mg Oral Q1200  . [COMPLETED] warfarin  10 mg Oral ONCE-1800  . warfarin  10 mg Oral ONCE-1800  . Warfarin - Pharmacist Dosing Inpatient   Does not apply q1800   Continuous Infusions:   . sodium chloride 100 mL/hr (01/09/12 1426)  . heparin 1,400 Units/hr (01/09/12 1453)  . [DISCONTINUED] heparin 1,500 Units/hr (01/09/12 1426)    Principal Problem:  *Acute Bilateral PE (pulmonary embolism) Active Problems:  CAP (community acquired pneumonia)  Pleuritic Chest Pain   Acute DVT Right LE  Right atrial thrombus    Time spent: > 40 minutes    Penny Pia  [facility] Pager [phone]. If 8PM-8AM, please contact night-coverage at [email or web address], password [facility] 01/10/2012, 9:46 AM  LOS: 3 days

## 2012-01-10 NOTE — Progress Notes (Signed)
ANTICOAGULATION CONSULT NOTE - Follow Up Consult  Pharmacy Consult:  Heparin, coumadin Indication:  PE + RLE DVT   No Known Allergies   Labs:  Basename 01/10/12 0425 01/09/12 1300 01/09/12 0432 01/08/12 0537 01/07/12 1818 01/07/12 1127  HGB 13.5 -- 13.5 -- -- --  HCT 38.5* -- 38.1* 38.5* -- --  PLT 195 -- 179 167 -- --  APTT -- -- -- -- -- --  LABPROT 17.9* -- 16.3* 15.3* -- --  INR 1.52* -- 1.34 1.23 -- --  HEPARINUNFRC 0.67 0.75* 0.90* -- -- --  CREATININE 1.01 -- 0.97 1.04 -- --  CKTOTAL -- -- -- -- -- --  CKMB -- -- -- -- -- --  TROPONINI -- -- -- -- <0.30 <0.30    Estimated Creatinine Clearance: 90.1 ml/min (by C-G formula based on Cr of 1.01).   Assessment: 33 YOM c/o symptoms similar to prior PNA, found on CT to have prominent PE and also noted with likely R atrial thrombus.  He also has a new DVT on RLE. Heparin level this am 0.67 and INR=1.52 on day 4 of coumadin/heparin overlap.   Goal of Therapy:  INR 2-3 HL 0.3-0.7 unit/mL Monitor platelets by anticoagulation protocol: Yes  Plan:  -Continue heparin at 1400 units/hr  -Coumadin 10mg  po today -Will recheck a heparin level in am and PT/INR daily  Harland German, Pharm D 01/10/2012 8:48 AM

## 2012-01-11 ENCOUNTER — Other Ambulatory Visit: Payer: Self-pay | Admitting: Oncology

## 2012-01-11 DIAGNOSIS — R222 Localized swelling, mass and lump, trunk: Secondary | ICD-10-CM

## 2012-01-11 LAB — CBC
HCT: 38.1 % — ABNORMAL LOW (ref 39.0–52.0)
Hemoglobin: 13.4 g/dL (ref 13.0–17.0)
MCHC: 35.2 g/dL (ref 30.0–36.0)
RBC: 4.42 MIL/uL (ref 4.22–5.81)

## 2012-01-11 LAB — HEPARIN LEVEL (UNFRACTIONATED)
Heparin Unfractionated: 0.21 IU/mL — ABNORMAL LOW (ref 0.30–0.70)
Heparin Unfractionated: 0.3 IU/mL (ref 0.30–0.70)
Heparin Unfractionated: 0.28 IU/mL — ABNORMAL LOW (ref 0.30–0.70)

## 2012-01-11 LAB — PROTIME-INR: Prothrombin Time: 20.1 seconds — ABNORMAL HIGH (ref 11.6–15.2)

## 2012-01-11 MED ORDER — WARFARIN SODIUM 2.5 MG PO TABS
12.5000 mg | ORAL_TABLET | Freq: Once | ORAL | Status: AC
  Administered 2012-01-11: 12.5 mg via ORAL
  Filled 2012-01-11 (×2): qty 1

## 2012-01-11 NOTE — Progress Notes (Signed)
PULMONARY  / CRITICAL CARE MEDICINE  Name: Daniel Glover MRN: [id number] DOB: 1960/04/06    LOS: 4  REFERRING PROVIDER:   Rai CHIEF COMPLAINT:   PE  BRIEF PATIENT DESCRIPTION:  51 year old male admitted on 11/10 with acute RLE DVT, R>L pulmonary emboli (w/ large clot burden), and Echo concerning for R apical thrombus.   SIGNIFICANT EVENTS:  CT chest 11/11: 1. Prominent pulmonary embolus within pulmonary arteries to all lobes of both lungs. Greater clot burden noted on the right, with pulmonary embolus in the right main pulmonary artery, extending into segmental and subsegmental branches. More distal left sided pulmonary emboli seen.  2. Pulmonary infarcts of the left lingula and left lower lobe; mild right basilar airspace opacity may also reflect pulmonary  infarct, though associated blebs are of uncertain significance ECHO 11/11: - Left ventricle: The cavity size was normal. Systolicfunction was normal. The estimated ejection fraction was in the range of 55% to 60%. Wall motion was normal; therewere no regional wall motion abnormalities. - Right ventricle: The cavity size was mildly dilated. Wall thickness was normal.Right atrium: There is a mobile density in the RA of unclear etiology   HISTORY OF PRESENT ILLNESS:   51 year old male who was admitted w/ abrupt onset of left sided chest pain, and dyspnea on 11/10. Dx eval demonstrated RLE DVT and extensive pulmonary emboli R>L, w/ echo mentioning concern for right atrial thrombus. On further questioning he was noting RLE pain about 3 weeks prior. Noted that this had resolved prior to his new acute onset. He notes chronic dyspnea over last year from what was treated as presumed pneumonias. He notes that he has had decreased activity tolerance since these events and has never really returned to baseline. He denies recent travel, trauma, new medications or prolonged immobility. PCCM asked to eval given concern about right atrial thrombus.    INTERVAL HISTORY:   VITAL SIGNS: Temp:  [98.2 F (36.8 C)-98.7 F (37.1 C)] 98.6 F (37 C) (11/14 0758) Pulse Rate:  [61-72] 61  (11/14 0758) BP: (112-140)/(57-70) 112/62 mmHg (11/14 0715) SpO2:  [93 %-99 %] 95 % (11/14 0758)  PHYSICAL EXAMINATION: General:  Well developed AAM currently in no acute distress Neuro:  intact HEENT:  intact Cardiovascular:  Regular rate and regular rhythm  Lungs:  Clear to ausciltation Abdomen:  Soft non-tender  Musculoskeletal:  Intact  Skin:  Intact    Lab 01/10/12 0425 01/09/12 0432 01/08/12 0537  NA 136 136 137  K 4.1 4.0 3.8  CL 99 100 102  CO2 26 25 25   BUN 12 12 12   CREATININE 1.01 0.97 1.04  GLUCOSE 83 84 85    Lab 01/11/12 0425 01/10/12 0425 01/09/12 0432  HGB 13.4 13.5 13.5  HCT 38.1* 38.5* 38.1*  WBC 4.5 5.7 8.2  PLT 183 195 179   No results found.  ASSESSMENT / PLAN:  1) Acute PE/DVT w/ pulmonary infarct.  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 Pt does have family h/o Lupus (mother) and father died of some blood dyscrasia he was uncertain of details. He has clinically improved, however his largest concern as of now is the right atrial thrombus and risk of further PE in the near future. Currently not a candidate for thrombolytics as he is improving, and there is evidence of pulmonary infarct, however should he develop acute distress this may be required.  Spoke with cardiology, they are comfortable that the object seen echo  is a blood clot not a mass. No TEE or IVC filter needed per cardiology/vascular.  Recommendation: Continue coumadin, dosed by pharmacy. INR daily. Will discontinue heparin once INR is in therapeutic range for 24 hours. Hypercoagulable panel after nine months on coumadin. Cardiology and vascular in-put appreciated.  Will repeat Echo in 4-6 weeks. Cont analgesia and oxygen.  PCCM will sign off, please call back if needed.  Alyson Reedy, M.D. [facility]  Pulmonary/Critical Care Medicine. Pager: [phone]. After hours pager: [phone].

## 2012-01-11 NOTE — Consult Note (Signed)
ONCOLOGY  HOSPITAL CONSULTATION NOTE  Daniel Glover                                MR#: [id number]  DOB: [birth year]                       CSN#: [id number]  Referring MD: [facility]   Primary MD: None  Reason for Consult: r/o Hypercoagulable state   YQI:[id number] Daniel Glover is a 51 y.o. male smoker with no prior medical history admitted on 01/07/12 with acute onset of shortness of breath and Left sided chest pain, as well as a 3 week history of right lower extremity pain. A CXR showed  bilateral PE, with subsequent CT angio of the Chest on 11/10 revealing extensive pulmonary embolus within pulmonary arteries to all lobes of both lungs, with clot burden on the right greater than left  with pulmonary embolus in the right main pulmonary artery, extending into segmental and subsegmental branches. More distal left sided pulmonary emboli seen.Pulmonary infarcts of the left lingula and left lower lobe were noted. A 2 D echo showed a mobile density in the RA of unclear etiology, with no emergent need for TEE of IVC filter per cardiology and vascular notes. A bilateral lower extremity doppler showed RLE DVT and extensive pulmonary emboli R>L. He was placed on Heparin, bridging to Coumadin when Heparin becomes therapeutic, with target INR 2-3, with today value at 1.78. Hypercoagulable panel drawn on 11 /10  /2013 prior to anticoagulation demonstrates AT III N/A,  B2 glycoprotein normal,  Prot C activity 102, Prot S activity 70,  Factor V Leiden mutation negative , Lupus Anticoagulant negative but with DRVVT at 47.6,  Homocysteine and   Prot G Mutation (G20210A) N/A ,Anticardiolipin Ab  Ig A 12, IgG 6 and IgM 0.   HIV was NR. Urine was negative.  Pertinent family history is mother with Sarcoidosis, sister with Lupus and father died with a "bleeding problems" but unable to provide further details. Denies a history of Sickle Cell disease or trait. No  ASA or   NSAIDs. Patient states that has never had a hematological  evaluation prior to this admission. Never had a bone marrow biopsy. AFP less than 1.3, CA 19.9 is 6, CEA less than 0.5 and PSA 0.61. No recent surgeries.He works as a Psychologist, educational at [employer], unsure of trauma.He denies the use of body building supplements.   No recent long distance trips. Denies prior PE/DVT history. Sedentary lifestyle with decreased tolerance to activities.   We were kindly asked to see the patient with recommendations.   PMH:  Past Medical History  Diagnosis Date  . Pneumonia     Surgeries:  Past Surgical History  Procedure Date  . Tonsillectomy   . Appendectomy 2009    Allergies: No Known Allergies  Medications:   Prior to Admission:  No prescriptions prior to admission    ZDG:LOVFIEPPIRJJO, HYDROmorphone (DILAUDID) injection, oxyCODONE  ROS: Constitutional: Negative  for weight loss. Negative for fever, chills or  night sweats. Positive for fatigue over the last 4 months.  Eyes: Negative for blurred vision and double vision.  Respiratory: Negative for cough. No hemoptysis. Shortness of breath as mentioned on HPI. Had L pleuritic chest pain on admission.He did have Pneumonia 4 months ago.   Cardiovascular: Negative for chest pain. No palpitations.  GI: Negative for  nausea, vomiting, diarrhea or constipation. No change  in bowel caliber. No  Melena or hematochezia. No abdominal pain.  GU: Negative for hematuria. No loss of urinary control. No urinary retention. Musculoskeletal: He noticed progressive, 3 week history of R calf tenderness.  No Swelling of the upper / Lower extremities  Skin: Negative for itching. No rash. No petechia. No easy bruising.  Neurological: No headaches. No motor or sensory deficits.  Family History:    Family History  Problem Relation Age of Onset  . Lupus Sister   . Bleeding Disorder Father   . Saroidosis Mother      No family history of strokes, PE or DVT. No family history of cancer.   Social History: Single. 3 Children.  Lives in [place]. Works as a Pharmacist, hospital.  Smokes 1 ppd for 15 Yrs . No ETOH or recreational drugs.  Physical Exam    Filed Vitals:   01/11/12 0758  BP:   Pulse: 61  Temp: 98.6 F (37 C)  Resp:       General:51 y.o. African American  male. in no acute distress A. and O. x3  well-developed , well nourished, appears the stated age.  HEENT: Normocephalic, atraumatic, PERRLA. Oral cavity without thrush or lesions. Neck supple. no thyromegaly, no cervical or supraclavicular adenopathy  Lungs clear bilaterally . No wheezing, rhonchi or rales. No axillary masses. Cardiac regular rate and rhythm normal S1-S2, no murmur , rubs or gallops Abdomen soft nontender,bowel sounds x4. No HSM. No masses palpable.  GU/rectal: deferred. Extremities no clubbing cyanosis. No pitting edema. No erythema. No bruising or petechial rash Musculoskeletal: no spinal tenderness.  Neuro: Non Focal  Labs:  CBC   Lab 01/11/12 0425 01/10/12 0425 01/09/12 0432 01/08/12 0537 01/07/12 0235 01/07/12 0222  WBC 4.5 5.7 8.2 10.7* -- 13.6*  HGB 13.4 13.5 13.5 13.6 15.6 --  HCT 38.1* 38.5* 38.1* 38.5* 46.0 --  PLT 183 195 179 167 -- 159  MCV 86.2 85.9 86.4 88.3 -- 86.6  MCH 30.3 30.1 30.6 31.2 -- 31.3  MCHC 35.2 35.1 35.4 35.3 -- 36.1*  RDW 13.1 13.1 13.2 13.6 -- 13.5  LYMPHSABS -- -- -- -- -- --  MONOABS -- -- -- -- -- --  EOSABS -- -- -- -- -- --  BASOSABS -- -- -- -- -- --  BANDABS -- -- -- -- -- --     CMP    Lab 01/10/12 0425 01/09/12 0432 01/08/12 0537 01/07/12 0724 01/07/12 0235  NA 136 136 137 138 138  K 4.1 4.0 3.8 4.2 3.9  CL 99 100 102 102 103  CO2 26 25 25 26  --  GLUCOSE 83 84 85 105* 114*  BUN 12 12 12 10 9   CREATININE 1.01 0.97 1.04 1.06 1.20  CALCIUM 8.6 8.8 8.7 9.0 --  MG -- -- -- -- --  AST -- -- -- 20 --  ALT -- -- -- 20 --  ALKPHOS -- -- -- 71 --  BILITOT -- -- -- 1.2 --        Component Value Date/Time   BILITOT 1.2 01/07/2012 0724   BILIDIR 0.5* 05/18/2007 1110    IBILI 2.2* 05/18/2007 1110      Lab 01/11/12 0425 01/10/12 0425 01/09/12 0432 01/08/12 0537  INR 1.78* 1.52* 1.34 1.23  PROTIME -- -- -- --     Imaging Studies:  Dg Chest 2 View  01/07/2012   Comparison: Chest radiograph performed 10/28/2011  Findings: The lungs are well-aerated.  Mild focal left lower lobe opacity is compatible  with pneumonia.  There is no evidence of pleural effusion or pneumothorax.  The heart is normal in size; the mediastinal contour is within normal limits.  No acute osseous abnormalities are seen.  IMPRESSION: Mild left lower lobe pneumonia noted.   Original Report Authenticated By: Tonia Ghent, M.D.    Ct Angio Chest Pe W/cm &/or Wo Cm  01/09/2012  **ADDENDUM** CREATED: 01/09/2012 08:06:18  Upon further review, in images 176 - 186 of series 6 demonstrate a filling defect in the medial aspect of the right atrium that measures approximately 1.8 x 1.1 x 1.3 cm.  This is in the lower third of the right atrium, and appears in close proximity to the septal leaflet of the tricuspid valve.  This appears to be separate from the interatrial septum, and is located just anterior/superior to the ostium of the coronary sinus.  Given the presence of bilateral pulmonary emboli, this is statistically most likely to represent a right atrial thrombus.  This was discussed by phone with Dr. Herbie Baltimore of Cardiology by Dr. Llana Aliment on 01/08/2012 at 08:50 p.m.  **END ADDENDUM** SIGNED BY: Florencia Reasons, M.D.    CT angio of the Chest 01/07/2012 Comparison: Chest radiograph performed earlier today at 02:35 a.m.  Findings: There is prominent pulmonary embolus within pulmonary arteries to all lobes of both lungs.  There is greater clot burden on the right, with pulmonary embolus noted in the right main pulmonary artery, extending into segmental and subsegmental branches.  More distal left sided pulmonary emboli are seen.  Airspace opacification within the left lingula and left lower lobe is  compatible with pulmonary infarct.  Mild right basilar airspace opacity may also reflect pulmonary infarct, though there is some degree of associated bleb formation, of uncertain significance. There is no evidence of pleural effusion or pneumothorax.  No masses are identified; no abnormal focal contrast enhancement is seen.  The mediastinum is unremarkable in appearance.  No mediastinal lymphadenopathy is seen.  No pericardial effusion is identified. The great vessels are unremarkable in appearance.  No axillary lymphadenopathy is seen.  The visualized portions of the thyroid gland are unremarkable in appearance.  The visualized portions of the liver and spleen are unremarkable. The visualized portions of the pancreas, stomach, adrenal glands and kidneys are within normal limits.  No acute osseous abnormalities are seen.  IMPRESSION:  1.  Prominent pulmonary embolus within pulmonary arteries to all lobes of both lungs.  Greater clot burden noted on the right, with pulmonary embolus in the right main pulmonary artery, extending into segmental and subsegmental branches.  More distal left sided pulmonary emboli seen. 2.  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  Critical Value/emergent results were called by telephone at the time of interpretation on 01/07/2012 at 05:59 a.m. to Dr. Sunnie Nielsen, who verbally acknowledged these results.  Original Report Authenticated By: Tonia Ghent, M.D.       A/P: 51 y.o. male smoker admitted on 11/10 with shortness of breath and pleuritic chest pain, found to have acute PE and DVT, as well as a R atrial thrombus requiring anticoagulation. LA is negative, with some elevation of the DRVVT, negative FV Leiden. Rest of the panel pending.  He received Heparin per pharmacy which is to bridge to Coumadin once INR therapeutic.Will await Prot  G Mutation and  Homocysteine results. Check ANA.  Dr. Donnie Coffin  is to see  the patient following this consult with recommendations regarding diagnosis and  further workup studies; Patient may need to be on long term anticoagulation.  An addendum to this note is to be written. Thank you for the referral.  [facility] 01/11/2012 11:51 AM

## 2012-01-11 NOTE — Progress Notes (Signed)
ANTICOAGULATION CONSULT NOTE - Follow Up Consult  Pharmacy Consult for heparin Indication: pulmonary embolus and DVT  Labs:  Basename 01/11/12 0425 01/10/12 0425 01/09/12 1300 01/09/12 0432  HGB 13.4 13.5 -- --  HCT 38.1* 38.5* -- 38.1*  PLT 183 195 -- 179  APTT -- -- -- --  LABPROT 20.1* 17.9* -- 16.3*  INR 1.78* 1.52* -- 1.34  HEPARINUNFRC 0.21* 0.67 0.75* --  CREATININE -- 1.01 -- 0.97  CKTOTAL -- -- -- --  CKMB -- -- -- --  TROPONINI -- -- -- --    Assessment: 51yo male now subtherapeutic on heparin after one level at goal for PE.  Goal of Therapy:  Heparin level 0.3-0.7 units/ml   Plan:  Will increase heparin gtt by 1-2 units/kg/hr to 1500 units/hr (was supratherapeutic above this rate) and check level in 6hr.  Colleen Can PharmD BCPS 01/11/2012,5:57 AM

## 2012-01-11 NOTE — Progress Notes (Signed)
ANTICOAGULATION CONSULT NOTE - Follow Up Consult  Pharmacy Consult:  Heparin, coumadin Indication:  PE + RLE DVT   No Known Allergies   Labs:  Basename 01/11/12 1126 01/11/12 0425 01/10/12 0425 01/09/12 0432  HGB -- 13.4 13.5 --  HCT -- 38.1* 38.5* 38.1*  PLT -- 183 195 179  APTT -- -- -- --  LABPROT -- 20.1* 17.9* 16.3*  INR -- 1.78* 1.52* 1.34  HEPARINUNFRC 0.30 0.21* 0.67 --  CREATININE -- -- 1.01 0.97  CKTOTAL -- -- -- --  CKMB -- -- -- --  TROPONINI -- -- -- --    Estimated Creatinine Clearance: 90.1 ml/min (by C-G formula based on Cr of 1.01).     . sodium chloride 100 mL/hr at 01/11/12 0900  . heparin 1,500 Units/hr (01/11/12 0900)     Assessment: 51 YOM c/o symptoms similar to prior PNA, found on CT to have prominent PE and also noted with likely R atrial thrombus.  He also has a new DVT on RLE. Heparin level 0.3 on 1500 units/hr and INR=1.78 on day 5 of coumadin/heparin overlap, but requires INR > 2 x 24 hrs before stopping IV heparin.  Per RN, no trouble with infusion.  No bleeding noted, CBC stable.  Goal of Therapy:  INR 2-3 HL 0.3-0.7 unit/mL Monitor platelets by anticoagulation protocol: Yes  Plan:  -Continue heparin at 1500 units/hr  -Coumadin 12.5 mg po today -Will recheck a heparin level in 6 hrs to confirm still in therapeutic range. -Daily heparin level, CBC and INR.  Reece Leader, Pharm D 01/11/2012 1:20 PM

## 2012-01-11 NOTE — Progress Notes (Signed)
ANTICOAGULATION CONSULT NOTE - Follow Up Consult  Pharmacy Consult:  Heparin Indication:  PE + RLE DVT   No Known Allergies   Labs:  Basename 01/11/12 1841 01/11/12 1126 01/11/12 0425 01/10/12 0425 01/09/12 0432  HGB -- -- 13.4 13.5 --  HCT -- -- 38.1* 38.5* 38.1*  PLT -- -- 183 195 179  APTT -- -- -- -- --  LABPROT -- -- 20.1* 17.9* 16.3*  INR -- -- 1.78* 1.52* 1.34  HEPARINUNFRC 0.28* 0.30 0.21* -- --  CREATININE -- -- -- 1.01 0.97  CKTOTAL -- -- -- -- --  CKMB -- -- -- -- --  TROPONINI -- -- -- -- --    Estimated Creatinine Clearance: 90.1 ml/min (by C-G formula based on Cr of 1.01).     . sodium chloride 100 mL/hr at 01/11/12 1400  . heparin 1,500 Units/hr (01/11/12 1400)     Assessment: 51 YOM c/o symptoms similar to prior PNA, found on CT to have prominent PE and also noted with likely R atrial thrombus.  He also has a new DVT on RLE. Pt on  Heparin bridge to coumadin. Heparin level 0.28 (slightly subtherapeutic) on 1500 units/hr. No bleeding noted.  Goal of Therapy:  HL 0.3-0.7 unit/mL Monitor platelets by anticoagulation protocol: Yes  Plan:  -Increase heparin to 1650 units/hr  -F/u a.m. heparin level  Christoper Fabian, PharmD, BCPS Clinical pharmacist, pager [phone]  01/11/2012 7:43 PM

## 2012-01-11 NOTE — Progress Notes (Signed)
Referral MD  Reason for Referral:  Pulmonary Embolus, pls see accompanying consult not of [facility] [place].  Chief Complaint  Patient presents with  . Shortness of Breath  . Breathing Problem  :    HPI: This previously healthy 51 AAM was admitted for SOB , chest pain and found to have a large PE, R>L. He is on heparin and did not receive thrombolytics. He continues to be dyspneic with minimal exertion.   Past Medical History  Diagnosis Date  . Pneumonia   :he relates having similar episodes of chest discomfort dating back to 1/13; he was last in the ER in 8/13, and was treated for PNA  Past Surgical History  Procedure Date  . Tonsillectomy   . Appendectomy   :  Current facility-administered medications:0.9 %  sodium chloride infusion, , Intravenous, Continuous, Therisa Doyne, MD, Last Rate: 100 mL/hr at 01/11/12 1957;  acetaminophen (TYLENOL) tablet 650 mg, 650 mg, Oral, Q6H PRN, Ripudeep K Rai, MD;  chlorpheniramine-HYDROcodone (TUSSIONEX) 10-8 MG/5ML suspension 5 mL, 5 mL, Oral, Q8H, Ripudeep K Rai, MD, 5 mL at 01/11/12 1714 heparin ADULT infusion 100 units/mL (25000 units/250 mL), 1,650 Units/hr, Intravenous, Continuous, Hilario Quarry Amend, PHARMD, Last Rate: 16.5 mL/hr at 01/11/12 1955, 1,650 Units/hr at 01/11/12 1955;  HYDROmorphone (DILAUDID) injection 1 mg, 1 mg, Intravenous, Q2H PRN, Ripudeep K Rai, MD, 1 mg at 01/11/12 0604;  oxyCODONE (Oxy IR/ROXICODONE) immediate release tablet 5-10 mg, 5-10 mg, Oral, Q2H PRN, Ripudeep K Rai, MD, 5 mg at 01/11/12 1918 pantoprazole (PROTONIX) EC tablet 40 mg, 40 mg, Oral, Q1200, Therisa Doyne, MD, 40 mg at 01/11/12 1141;  [COMPLETED] warfarin (COUMADIN) tablet 12.5 mg, 12.5 mg, Oral, ONCE-1800, Jessica C Carney, PHARMD, 12.5 mg at 01/11/12 1714;  Warfarin - Pharmacist Dosing Inpatient, , Does not apply, q1800, Colleen Can, PHARMD:     . chlorpheniramine-HYDROcodone  5 mL Oral Q8H  . pantoprazole  40 mg Oral Q1200  . [COMPLETED]  warfarin  12.5 mg Oral ONCE-1800  . Warfarin - Pharmacist Dosing Inpatient   Does not apply q1800  :  No Known Allergies:  Family History  Problem Relation Age of Onset  . Lupus Mother   . Bleeding Disorder Father   . Hypertension Brother   :  History   Social History  . Marital Status: Single    Spouse Name: N/A    Number of Children: 3 children, 5 grandchildren  . Years of Education: N/A   Occupational History  . Not on file.   Social History Main Topics  . Smoking status: Current Every Day Smoker -- 1.0 packs/day for 15 years    Types: Cigarettes  . Smokeless tobacco: Never Used  . Alcohol Use: Yes     Comment: occassionally  . Drug Use: No  . Sexually Active:    Other Topics Concern  . Not on file   Social History Narrative  . No narrative on file  :  Generally negative; no B symptoms or constitutional symptoms.  Exam: Patient Vitals for the past 24 hrs:  BP Temp Temp src Pulse Resp SpO2  01/11/12 1956 126/84 mmHg 98.6 F (37 C) Oral - - 96 %  01/11/12 1633 134/72 mmHg 98.8 F (37.1 C) Oral 86  16  96 %  01/11/12 1202 129/64 mmHg 98.1 F (36.7 C) Oral 62  - 91 %  01/11/12 0758 - 98.6 F (37 C) Oral 61  - 95 %  01/11/12 0715 112/62 mmHg - - - - -  01/11/12 0411 - 98.4 F (36.9 C) Oral - - 99 %  01/10/12 2346 - 98.6 F (37 C) Oral - - 95 %  01/10/12 2330 113/57 mmHg - - - - -   No adenopathy/organomegaly.   Basename 01/11/12 0425 01/10/12 0425  WBC 4.5 5.7  HGB 13.4 13.5  HCT 38.1* 38.5*  PLT 183 195    Basename 01/10/12 0425 01/09/12 0432  NA 136 136  K 4.1 4.0  CL 99 100  CO2 26 25  GLUCOSE 83 84  BUN 12 12  CREATININE 1.01 0.97  CALCIUM 8.6 8.8    Blood smear review:    Pathology:   Dg Chest 2 View  01/07/2012  *RADIOLOGY REPORT*  Clinical Data: Shortness of breath and left chest pain.  CHEST - 2 VIEW  Comparison: Chest radiograph performed 10/28/2011  Findings: The lungs are well-aerated.  Mild focal left lower lobe opacity  is compatible with pneumonia.  There is no evidence of pleural effusion or pneumothorax.  The heart is normal in size; the mediastinal contour is within normal limits.  No acute osseous abnormalities are seen.  IMPRESSION: Mild left lower lobe pneumonia noted.   Original Report Authenticated By: Tonia Ghent, M.D.    Ct Angio Chest Pe W/cm &/or Wo Cm  01/09/2012  **ADDENDUM** CREATED: 01/09/2012 08:06:18  Upon further review, in images 176 - 186 of series 6 demonstrate a filling defect in the medial aspect of the right atrium that measures approximately 1.8 x 1.1 x 1.3 cm.  This is in the lower third of the right atrium, and appears in close proximity to the septal leaflet of the tricuspid valve.  This appears to be separate from the interatrial septum, and is located just anterior/superior to the ostium of the coronary sinus.  Given the presence of bilateral pulmonary emboli, this is statistically most likely to represent a right atrial thrombus.  This was discussed by phone with Dr. Herbie Baltimore of Cardiology by Dr. Llana Aliment on 01/08/2012 at 08:50 p.m.  **END ADDENDUM** SIGNED BY: Florencia Reasons, M.D.   01/07/2012  *RADIOLOGY REPORT*  Clinical Data: Left-sided pleuritic chest pain and shortness of breath.  CT ANGIOGRAPHY CHEST  Technique:  Multidetector CT imaging of the chest using the standard protocol during bolus administration of intravenous contrast. Multiplanar reconstructed images including MIPs were obtained and reviewed to evaluate the vascular anatomy.  Contrast: OMNIPAQUE IOHEXOL 350 MG/ML SOLN  Comparison: Chest radiograph performed earlier today at 02:35 a.m.  Findings: There is prominent pulmonary embolus within pulmonary arteries to all lobes of both lungs.  There is greater clot burden on the right, with pulmonary embolus noted in the right main pulmonary artery, extending into segmental and subsegmental branches.  More distal left sided pulmonary emboli are seen.  Airspace  opacification within the left lingula and left lower lobe is compatible with pulmonary infarct.  Mild right basilar airspace opacity may also reflect pulmonary infarct, though there is some degree of associated bleb formation, of uncertain significance. There is no evidence of pleural effusion or pneumothorax.  No masses are identified; no abnormal focal contrast enhancement is seen.  The mediastinum is unremarkable in appearance.  No mediastinal lymphadenopathy is seen.  No pericardial effusion is identified. The great vessels are unremarkable in appearance.  No axillary lymphadenopathy is seen.  The visualized portions of the thyroid gland are unremarkable in appearance.  The visualized portions of the liver and spleen are unremarkable. The visualized portions of the pancreas, stomach, adrenal  glands and kidneys are within normal limits.  No acute osseous abnormalities are seen.  IMPRESSION:  1.  Prominent pulmonary embolus within pulmonary arteries to all lobes of both lungs.  Greater clot burden noted on the right, with pulmonary embolus in the right main pulmonary artery, extending into segmental and subsegmental branches.  More distal left sided pulmonary emboli seen. 2.  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  Critical Value/emergent results were called by telephone at the time of interpretation on 01/07/2012 at 05:59 a.m. to Dr. Sunnie Nielsen, who verbally acknowledged these results.  Original Report Authenticated By: Tonia Ghent, M.D.     Assessment and Plan:   Generally healthy 51 yo man admitted with bilateral PE. Hypercoag w/u  To date not indicative of significant abnormalities. I would recommend at least 12 months of anticoag therapy and possibly lifelong if this is truly an uprovoked , life threatening PE, with no underlying coagulopathy. I would go  ahead with CT of abdomen/pelvis and recommend a colonoscopy as an outpt. He may require f/u imaging of heart as well to differentiate between atrial thrombus vs mass.  Pierce Crane MD

## 2012-01-11 NOTE — Progress Notes (Signed)
[facility] PROGRESS NOTE  Daniel Glover AVW:[id number] DOB: 1960-10-11 DOA: 01/07/2012 PCP: Sheila Oats, MD  Assessment/Plan: *Acute Bilateral PE (pulmonary embolism) with Rt LE DVT and large thrombus in right atrium :  - on heparin drip and Coumadin, hemodynamically stable, in SDU. Target INR 2-3 - Hypercoagulable panel shows elevated drvvt but lupus anticoagulant is negative.  - B-2-glycoprotein and cardiolipin antibody negative - Tumor markers: AFP, CA 19-9, CEA, PSA negative. - 2D Echo shows large thrombus in right atrium seems to extend from IVC up to septal leaflet of tricuspid valve, appreciate Dr Elissa Hefty recommendations. - Cardiology, Vascular, PCCM on board. - Given work up results and currently no identifiable reason for extensive blood clots I will consult hematologist for recommendations regarding further work up recommendations.   Pleuritic Chest Pain  - oxycodone and IV Dilaudid PRN. Patient currently tolerating pain on this regimen.  Most likely related to coughing and recent PE's  Acute DVT Right LE: As #1  Code Status: Full  Family Communication: No family at bedside. Disposition Plan: Will have care manager discuss costs of medication with patient and provide list of PCP's for patient to follow up with if he doesn't already have a pcp.  Once INR 2-3 will consider d/c if patient stable for discharge.    Consultants:  Vascular  PCCM  Cardiology  Procedures:  None  Antibiotics:  Started on Azithromycin and Rocephin.  Discontinued 01/10/12  HPI/Subjective: No new complaints today. Had many questions regarding why he had clots and activity levels once he transitioned home.  No new complaints at this juncture.  Objective: Filed Vitals:   01/10/12 2346 01/11/12 0411 01/11/12 0715 01/11/12 0758  BP:   112/62   Pulse:    61  Temp: 98.6 F (37 C) 98.4 F (36.9 C)  98.6 F (37 C)  TempSrc: Oral Oral  Oral  Resp:      Height:      Weight:       SpO2: 95% 99%  95%    Intake/Output Summary (Last 24 hours) at 01/11/12 1102 Last data filed at 01/11/12 0900  Gross per 24 hour  Intake   3471 ml  Output   4060 ml  Net   -589 ml   Filed Weights   01/07/12 0336 01/07/12 0625 01/09/12 1450  Weight: 77.111 kg (170 lb) 77.111 kg (170 lb) 84.3 kg (185 lb 13.6 oz)    Exam:   General:  Pt in NAD, Alert and Oriented x 3  Cardiovascular: RRR, No MRG  Respiratory: Decreased breath sounds at bases, no wheezes, Breath sounds BL  Abdomen: soft, NT, ND  Extremity: + dorsal pedis pulses BL, no cyanosis  Data Reviewed: Basic Metabolic Panel:  Lab 01/10/12 8295 01/09/12 0432 01/08/12 0537 01/07/12 0724 01/07/12 0235  NA 136 136 137 138 138  K 4.1 4.0 3.8 4.2 3.9  CL 99 100 102 102 103  CO2 26 25 25 26  --  GLUCOSE 83 84 85 105* 114*  BUN 12 12 12 10 9   CREATININE 1.01 0.97 1.04 1.06 1.20  CALCIUM 8.6 8.8 8.7 9.0 --  MG -- -- -- -- --  PHOS -- -- -- -- --   Liver Function Tests:  Lab 01/07/12 0724  AST 20  ALT 20  ALKPHOS 71  BILITOT 1.2  PROT 7.0  ALBUMIN 3.3*   No results found for this basename: LIPASE:5,AMYLASE:5 in the last 168 hours No results found for this basename: AMMONIA:5 in the last 168 hours CBC:  Lab 01/11/12 0425 01/10/12 0425 01/09/12 0432 01/08/12 0537 01/07/12 0235 01/07/12 0222  WBC 4.5 5.7 8.2 10.7* -- 13.6*  NEUTROABS -- -- -- -- -- --  HGB 13.4 13.5 13.5 13.6 15.6 --  HCT 38.1* 38.5* 38.1* 38.5* 46.0 --  MCV 86.2 85.9 86.4 88.3 -- 86.6  PLT 183 195 179 167 -- 159   Cardiac Enzymes:  Lab 01/07/12 1818 01/07/12 1127 01/07/12 0724  CKTOTAL -- -- --  CKMB -- -- --  CKMBINDEX -- -- --  TROPONINI <0.30 <0.30 <0.30   BNP (last 3 results) No results found for this basename: PROBNP:3 in the last 8760 hours CBG: No results found for this basename: GLUCAP:5 in the last 168 hours  No results found for this or any previous visit (from the past 240 hour(s)).   Studies: No results  found.  Scheduled Meds:    . chlorpheniramine-HYDROcodone  5 mL Oral Q8H  . pantoprazole  40 mg Oral Q1200  . [COMPLETED] warfarin  10 mg Oral ONCE-1800  . Warfarin - Pharmacist Dosing Inpatient   Does not apply q1800   Continuous Infusions:    . sodium chloride 100 mL/hr at 01/11/12 0900  . heparin 1,500 Units/hr (01/11/12 0900)    Principal Problem:  *Acute Bilateral PE (pulmonary embolism) Active Problems:  CAP (community acquired pneumonia)  Pleuritic Chest Pain   Acute DVT Right LE  Right atrial thrombus    Time spent: > 40 minutes    Penny Pia  [facility] Pager [phone]. If 8PM-8AM, please contact night-coverage at [email or web address], password [facility] 01/11/2012, 11:02 AM  LOS: 4 days

## 2012-01-12 ENCOUNTER — Inpatient Hospital Stay (HOSPITAL_COMMUNITY): Payer: Self-pay

## 2012-01-12 LAB — PROTIME-INR: Prothrombin Time: 21.1 seconds — ABNORMAL HIGH (ref 11.6–15.2)

## 2012-01-12 LAB — ANA: Anti Nuclear Antibody(ANA): NEGATIVE

## 2012-01-12 LAB — CBC
MCH: 30.3 pg (ref 26.0–34.0)
MCHC: 34.6 g/dL (ref 30.0–36.0)
Platelets: 188 10*3/uL (ref 150–400)
RDW: 13.3 % (ref 11.5–15.5)

## 2012-01-12 LAB — HEPARIN LEVEL (UNFRACTIONATED): Heparin Unfractionated: 0.49 IU/mL (ref 0.30–0.70)

## 2012-01-12 LAB — PROTEIN C, TOTAL: Protein C, Total: 59 % — ABNORMAL LOW (ref 72–160)

## 2012-01-12 MED ORDER — WARFARIN SODIUM 2.5 MG PO TABS
12.5000 mg | ORAL_TABLET | Freq: Once | ORAL | Status: AC
  Administered 2012-01-12: 12.5 mg via ORAL
  Filled 2012-01-12: qty 1

## 2012-01-12 MED ORDER — IOHEXOL 300 MG/ML  SOLN
20.0000 mL | INTRAMUSCULAR | Status: AC
  Administered 2012-01-12 (×2): 20 mL via ORAL

## 2012-01-12 MED ORDER — IOHEXOL 300 MG/ML  SOLN
80.0000 mL | Freq: Once | INTRAMUSCULAR | Status: AC | PRN
  Administered 2012-01-12: 80 mL via INTRAVENOUS

## 2012-01-12 MED ORDER — SALINE SPRAY 0.65 % NA SOLN
1.0000 | NASAL | Status: DC | PRN
  Administered 2012-01-12 – 2012-01-13 (×2): 1 via NASAL
  Filled 2012-01-12: qty 44

## 2012-01-12 NOTE — Progress Notes (Signed)
ANTICOAGULATION CONSULT NOTE - Follow Up Consult  Pharmacy Consult:  Heparin, Coumadin Indication:  PE + RLE DVT   No Known Allergies   Labs:  Basename 01/12/12 0600 01/11/12 1841 01/11/12 1126 01/11/12 0425 01/10/12 0425  HGB 14.1 -- -- 13.4 --  HCT 40.7 -- -- 38.1* 38.5*  PLT 188 -- -- 183 195  APTT -- -- -- -- --  LABPROT 21.1* -- -- 20.1* 17.9*  INR 1.90* -- -- 1.78* 1.52*  HEPARINUNFRC 0.49 0.28* 0.30 -- --  CREATININE -- -- -- -- 1.01  CKTOTAL -- -- -- -- --  CKMB -- -- -- -- --  TROPONINI -- -- -- -- --    Estimated Creatinine Clearance: 90.1 ml/min (by C-G formula based on Cr of 1.01).     . sodium chloride 100 mL/hr at 01/12/12 0414  . heparin 1,650 Units/hr (01/12/12 0412)     Assessment: 51 YOM c/o symptoms similar to prior PNA, found on CT to have prominent PE and also noted with likely R atrial thrombus.  He also has a new DVT on RLE. Patient is on day 6 of heparin  / Coumadin overlap, but requires INR > 2 x 24 hours before stopping IV heparin. Heparin level (0.49) is at-goal on 1650 units/hr. INR (1.9) is below-goal, but trending up.   Goal of Therapy:  HL 0.3-0.7 unit/mL INR 2-3 Monitor platelets by anticoagulation protocol: Yes  Plan:  1. Continue IV heparin at 1650 units/hr.  2. Heparin level in 6 hours to confirm dosing.  3. Coumadin 12.5mg  po today.  4. Daily PT / INR  Lorre Munroe, PharmD  01/12/2012 6:56 AM

## 2012-01-12 NOTE — Progress Notes (Signed)
[facility] PROGRESS NOTE  Daniel Glover ZOX:[id number] DOB: [birth year] DOA: 01/07/2012 PCP: Sheila Oats, MD  Assessment/Plan: *Acute Bilateral PE (pulmonary embolism) with Rt LE DVT and large thrombus in right atrium :  - on heparin drip and Coumadin, hemodynamically stable, in SDU. Target INR 2-3 - Hypercoagulable panel shows elevated drvvt but lupus anticoagulant is negative.  - B-2-glycoprotein and cardiolipin antibody negative - Tumor markers: AFP, CA 19-9, CEA, PSA negative. - 2D Echo shows large thrombus in right atrium seems to extend from IVC up to septal leaflet of tricuspid valve. - Cardiology, Vascular, PCCM, and hematology have evaluated patient. - Would like to thank hematology for their in put in this case.  CT of abdomen and pelvis negative for neoplastic focus in abdomen and pelvis - Will plan on discussing case with radiologist to decide which imaging study to order to better assess the mass in patient's heart which at this point has been presumed to be a thrombus.  Pleuritic Chest Pain  - oxycodone and IV Dilaudid PRN. Patient currently tolerating pain on this regimen.  Most likely related to coughing and recent PE's  Acute DVT Right LE: As #1  Code Status: Full  Family Communication: No family at bedside. Disposition Plan: Will have care manager discuss costs of medication with patient and provide list of PCP's for patient to follow up with if he doesn't already have a pcp.  Once INR 2-3 will consider d/c if patient stable for discharge.    Consultants:  Vascular  PCCM  Cardiology  hematology  Procedures:  None  Antibiotics:  Started on Azithromycin and Rocephin.  Discontinued 01/10/12  HPI/Subjective: No new complaints today. No acute issues overnight reported.  Objective: Filed Vitals:   01/11/12 1633 01/11/12 1956 01/12/12 0000 01/12/12 0800  BP: 134/72 126/84 124/82 117/69  Pulse: 86   58  Temp: 98.8 F (37.1 C) 98.6 F (37 C)  98.6 F (37 C) 98.5 F (36.9 C)  TempSrc: Oral Oral Axillary Oral  Resp: 16   18  Height:      Weight:      SpO2: 96% 96% 94% 95%    Intake/Output Summary (Last 24 hours) at 01/12/12 1045 Last data filed at 01/12/12 1000  Gross per 24 hour  Intake   3478 ml  Output   3700 ml  Net   -222 ml   Filed Weights   01/07/12 0336 01/07/12 0625 01/09/12 1450  Weight: 77.111 kg (170 lb) 77.111 kg (170 lb) 84.3 kg (185 lb 13.6 oz)    Exam:   General:  Pt in NAD, Alert and Oriented x 3  Cardiovascular: RRR, No MRG  Respiratory: Decreased breath sounds at bases, no wheezes, Breath sounds BL, speaking in full sentences  Abdomen: soft, NT, ND  Extremity: + dorsal pedis pulses BL, no cyanosis, ted hose in place  Data Reviewed: Basic Metabolic Panel:  Lab 01/10/12 8119 01/09/12 0432 01/08/12 0537 01/07/12 0724 01/07/12 0235  NA 136 136 137 138 138  K 4.1 4.0 3.8 4.2 3.9  CL 99 100 102 102 103  CO2 26 25 25 26  --  GLUCOSE 83 84 85 105* 114*  BUN 12 12 12 10 9   CREATININE 1.01 0.97 1.04 1.06 1.20  CALCIUM 8.6 8.8 8.7 9.0 --  MG -- -- -- -- --  PHOS -- -- -- -- --   Liver Function Tests:  Lab 01/07/12 0724  AST 20  ALT 20  ALKPHOS 71  BILITOT 1.2  PROT 7.0  ALBUMIN 3.3*   No results found for this basename: LIPASE:5,AMYLASE:5 in the last 168 hours No results found for this basename: AMMONIA:5 in the last 168 hours CBC:  Lab 01/12/12 0600 01/11/12 0425 01/10/12 0425 01/09/12 0432 01/08/12 0537  WBC 4.8 4.5 5.7 8.2 10.7*  NEUTROABS -- -- -- -- --  HGB 14.1 13.4 13.5 13.5 13.6  HCT 40.7 38.1* 38.5* 38.1* 38.5*  MCV 87.3 86.2 85.9 86.4 88.3  PLT 188 183 195 179 167   Cardiac Enzymes:  Lab 01/07/12 1818 01/07/12 1127 01/07/12 0724  CKTOTAL -- -- --  CKMB -- -- --  CKMBINDEX -- -- --  TROPONINI <0.30 <0.30 <0.30   BNP (last 3 results) No results found for this basename: PROBNP:3 in the last 8760 hours CBG: No results found for this basename: GLUCAP:5 in the  last 168 hours  No results found for this or any previous visit (from the past 240 hour(s)).   Studies: Ct Abdomen Pelvis W Contrast  01/12/2012  *RADIOLOGY REPORT*  Clinical Data: Pulmonary embolus with concern for underlying malignancy  CT ABDOMEN AND PELVIS WITH CONTRAST  Technique:  Multidetector CT imaging of the abdomen and pelvis was performed following the standard protocol during bolus administration of intravenous contrast. Oral contrast was also administered.  Contrast: 80mL OMNIPAQUE IOHEXOL 300 MG/ML  SOLN  Comparison: May 18, 2007  Findings: There is a pleural effusion on the left with left lower lobe consolidation.  There is patchy infiltrate in the right base as well.  There is a 5 mm cyst in the anterior segment right lobe of the liver which is stable.  No other liver lesions are identified. There is no biliary duct dilatation.  Spleen, pancreas, and right adrenal appear normal.  There is mild left adrenal hypertrophy, a stable finding.  Kidneys bilaterally show no mass or hydronephrosis.  In the pelvis, there is no mass or fluid collection.  Appendix is absent.  There is no bowel obstruction.  No free air or portal venous air. There is stool throughout the colon.  There is no ascites, adenopathy, or abscess in the abdomen or pelvis.  Aorta is nonaneurysmal.  There are no blastic or lytic bone lesions.  IMPRESSION: Left pleural effusion.  Infiltrate in both lung bases, more on the left than on the right.  No neoplastic focus is appreciated in the abdomen or pelvis.  There is no inflammatory focus in the abdomen or pelvis.  There is a large amount of stool in the colon.  A small stable cyst is noted in the right lobe of the liver incidentally.   Original Report Authenticated By: Bretta Bang, M.D.     Scheduled Meds:    . chlorpheniramine-HYDROcodone  5 mL Oral Q8H  . [COMPLETED] iohexol  20 mL Oral Q1 Hr x 2  . pantoprazole  40 mg Oral Q1200  . [COMPLETED] warfarin  12.5 mg  Oral ONCE-1800  . warfarin  12.5 mg Oral ONCE-1800  . Warfarin - Pharmacist Dosing Inpatient   Does not apply q1800   Continuous Infusions:    . sodium chloride 100 mL/hr at 01/12/12 0414  . heparin 1,650 Units/hr (01/12/12 0412)    Principal Problem:  *Acute Bilateral PE (pulmonary embolism) Active Problems:  CAP (community acquired pneumonia)  Pleuritic Chest Pain   Acute DVT Right LE  Right atrial thrombus    Time spent: > 40 minutes    Penny Pia  [facility] Pager [phone]. If 8PM-8AM, please contact night-coverage at  [email or web address], password [facility] 01/12/2012, 10:45 AM  LOS: 5 days

## 2012-01-12 NOTE — Progress Notes (Signed)
ANTICOAGULATION CONSULT NOTE - Follow Up Consult  Pharmacy Consult for Heparin, Coumadin Indication: PE + RLE DVT  No Known Allergies  Patient Measurements: Height: 5\' 6"  (167.6 cm) Weight: 185 lb 13.6 oz (84.3 kg) IBW/kg (Calculated) : 63.8   Vital Signs: Temp: 98.7 F (37.1 C) (11/15 1142) Temp src: Oral (11/15 1142) BP: 121/70 mmHg (11/15 1142) Pulse Rate: 65  (11/15 1142)  Labs:  Basename 01/12/12 1126 01/12/12 0600 01/11/12 1841 01/11/12 0425 01/10/12 0425  HGB -- 14.1 -- 13.4 --  HCT -- 40.7 -- 38.1* 38.5*  PLT -- 188 -- 183 195  APTT -- -- -- -- --  LABPROT -- 21.1* -- 20.1* 17.9*  INR -- 1.90* -- 1.78* 1.52*  HEPARINUNFRC 0.48 0.49 0.28* -- --  CREATININE -- -- -- -- 1.01  CKTOTAL -- -- -- -- --  CKMB -- -- -- -- --  TROPONINI -- -- -- -- --    Estimated Creatinine Clearance: 90.1 ml/min (by C-G formula based on Cr of 1.01).   Medications:  Scheduled:    . chlorpheniramine-HYDROcodone  5 mL Oral Q8H  . [COMPLETED] iohexol  20 mL Oral Q1 Hr x 2  . pantoprazole  40 mg Oral Q1200  . [COMPLETED] warfarin  12.5 mg Oral ONCE-1800  . warfarin  12.5 mg Oral ONCE-1800  . Warfarin - Pharmacist Dosing Inpatient   Does not apply q1800   Infusions:    . sodium chloride 100 mL/hr at 01/12/12 0414  . heparin 1,650 Units/hr (01/12/12 0412)    Assessment: Heparin level is therapeutic (0.48) on 1650 units/hr. Continue current rate.  Plan to discontinue when INR>2 for at least 24 hours. CBC is wnl.   Goal of Therapy:  Heparin level 0.3-0.7 units/ml Monitor platelets by anticoagulation protocol: Yes   Plan:  - Continue current rate of 1650 units/hr.  - Daily heparin levels  Lillia Pauls, PharmD Clinical Pharmacist Pager: [phone] Phone: [phone] 01/12/2012 12:38 PM

## 2012-01-13 LAB — CBC
HCT: 41.1 % (ref 39.0–52.0)
MCH: 30.3 pg (ref 26.0–34.0)
MCHC: 35 g/dL (ref 30.0–36.0)
MCV: 86.3 fL (ref 78.0–100.0)
Platelets: 198 10*3/uL (ref 150–400)
RDW: 13 % (ref 11.5–15.5)

## 2012-01-13 LAB — PROTIME-INR: INR: 2.35 — ABNORMAL HIGH (ref 0.00–1.49)

## 2012-01-13 MED ORDER — SODIUM CHLORIDE 0.9 % IJ SOLN
3.0000 mL | Freq: Two times a day (BID) | INTRAMUSCULAR | Status: DC
  Administered 2012-01-13 (×2): 3 mL via INTRAVENOUS

## 2012-01-13 MED ORDER — SODIUM CHLORIDE 0.9 % IJ SOLN: 3.0000 mL | INTRAMUSCULAR | Status: DC | PRN

## 2012-01-13 MED ORDER — WARFARIN SODIUM 7.5 MG PO TABS
7.5000 mg | ORAL_TABLET | Freq: Once | ORAL | Status: AC
  Administered 2012-01-13: 7.5 mg via ORAL
  Filled 2012-01-13: qty 1

## 2012-01-13 NOTE — Progress Notes (Addendum)
ANTICOAGULATION CONSULT NOTE - Follow Up Consult  Pharmacy Consult for Coumadin Indication: PE + RLE DVT  No Known Allergies  Patient Measurements: Height: 5\' 6"  (167.6 cm) Weight: 185 lb 13.6 oz (84.3 kg) IBW/kg (Calculated) : 63.8   Vital Signs: Temp: 97.8 F (36.6 C) (11/16 1215) Temp src: Oral (11/16 1215) BP: 125/77 mmHg (11/16 0812)  Labs:  Basename 01/13/12 0421 01/12/12 1126 01/12/12 0600 01/11/12 0425  HGB 14.4 -- 14.1 --  HCT 41.1 -- 40.7 38.1*  PLT 198 -- 188 183  APTT -- -- -- --  LABPROT 24.7* -- 21.1* 20.1*  INR 2.35* -- 1.90* 1.78*  HEPARINUNFRC 0.71* 0.48 0.49 --  CREATININE -- -- -- --  CKTOTAL -- -- -- --  CKMB -- -- -- --  TROPONINI -- -- -- --    Estimated Creatinine Clearance: 90.1 ml/min (by C-G formula based on Cr of 1.01).   Medications:  Scheduled:     . chlorpheniramine-HYDROcodone  5 mL Oral Q8H  . pantoprazole  40 mg Oral Q1200  . sodium chloride  3 mL Intravenous Q12H  . sodium chloride  3 mL Intravenous Q12H  . [COMPLETED] warfarin  12.5 mg Oral ONCE-1800  . Warfarin - Pharmacist Dosing Inpatient   Does not apply q1800   Infusions:     . [DISCONTINUED] sodium chloride 100 mL/hr (01/13/12 0700)  . [DISCONTINUED] heparin 1,650 Units/hr (01/12/12 1712)    Assessment: 51 yo male with PE/DVT on coumadin and INR= 2.35 (heparin d/c). INR noted with trend up and last dose was 12.5mg .  Goal of Therapy:  Heparin level 0.3-0.7 units/ml Monitor platelets by anticoagulation protocol: Yes   Plan:  -Coumadin 7.5mg  po today -Daily PT/INR

## 2012-01-13 NOTE — Progress Notes (Signed)
[facility] PROGRESS NOTE  Daniel Glover ZOX:[id number] DOB: 1961-01-30 DOA: 01/07/2012 PCP: Sheila Oats, MD  Assessment/Plan: *Acute Bilateral PE (pulmonary embolism) with Rt LE DVT and large thrombus in right atrium :  - was on heparin drip and Coumadin.  Pt is hemodynamically stable in SDU. On 01/13/12 his INR was 2.3 and as such heparin gtt discontinued and patient will be on coumadin for anticoagulation at this point given INR.  Target INR 2-3.  If INR between 2-3 next am would consider discharge at that juncture. - Hypercoagulable panel shows elevated drvvt but lupus anticoagulant is negative.  - B-2-glycoprotein and cardiolipin antibody negative - Tumor markers: AFP, CA 19-9, CEA, PSA negative. - 2D Echo shows large thrombus in right atrium seems to extend from IVC up to septal leaflet of tricuspid valve. - Cardiology, Vascular, PCCM, and hematology have evaluated patient. - Would like to thank hematology for their in put in this case.  CT of abdomen and pelvis negative for neoplastic focus in abdomen and pelvis - Case discussed with Radiologist again 01/13/12.  At this point Radiologist mentions that he has reviewed the case and the mass in patient's heart is most likely thrombus and he recommends no further imaging modality at this time.  Pleuritic Chest Pain  - oxycodone and IV Dilaudid PRN. Patient currently tolerating pain on this regimen.  Most likely related to coughing and recent PE's.  Acute DVT Right LE: As #1  Code Status: Full  Family Communication: No family at bedside. Disposition Plan: Will have care manager discuss costs of medication with patient and provide list of PCP's for patient to follow up with if he doesn't already have a pcp.  Once INR 2-3 will consider d/c if patient stable for discharge.    Consultants:  Vascular  PCCM  Cardiology  hematology  Procedures:  None  Antibiotics:  Started on Azithromycin and Rocephin.  Discontinued  01/10/12  HPI/Subjective: No new complaints today. No acute issues overnight reported.  Objective: Filed Vitals:   01/13/12 0346 01/13/12 0347 01/13/12 0400 01/13/12 0812  BP: 111/74  111/74 125/77  Pulse:      Temp:  98.1 F (36.7 C) 98.1 F (36.7 C) 97.5 F (36.4 C)  TempSrc:  Oral Oral Oral  Resp:    18  Height:      Weight:      SpO2: 95%  94%     Intake/Output Summary (Last 24 hours) at 01/13/12 0953 Last data filed at 01/13/12 0930  Gross per 24 hour  Intake 3384.25 ml  Output   2350 ml  Net 1034.25 ml   Filed Weights   01/07/12 0336 01/07/12 0625 01/09/12 1450  Weight: 77.111 kg (170 lb) 77.111 kg (170 lb) 84.3 kg (185 lb 13.6 oz)    Exam:   General:  Pt in NAD, Alert and Oriented x 3  Cardiovascular: RRR, No MRG  Respiratory:  no wheezes, Breath sounds BL, speaking in full sentences  Abdomen: soft, NT, ND  Extremity: + dorsal pedis pulses BL, no cyanosis, ted hose in place  Data Reviewed: Basic Metabolic Panel:  Lab 01/10/12 8119 01/09/12 0432 01/08/12 0537 01/07/12 0724 01/07/12 0235  NA 136 136 137 138 138  K 4.1 4.0 3.8 4.2 3.9  CL 99 100 102 102 103  CO2 26 25 25 26  --  GLUCOSE 83 84 85 105* 114*  BUN 12 12 12 10 9   CREATININE 1.01 0.97 1.04 1.06 1.20  CALCIUM 8.6 8.8 8.7 9.0 --  MG -- -- -- -- --  PHOS -- -- -- -- --   Liver Function Tests:  Lab 01/07/12 0724  AST 20  ALT 20  ALKPHOS 71  BILITOT 1.2  PROT 7.0  ALBUMIN 3.3*   No results found for this basename: LIPASE:5,AMYLASE:5 in the last 168 hours No results found for this basename: AMMONIA:5 in the last 168 hours CBC:  Lab 01/13/12 0421 01/12/12 0600 01/11/12 0425 01/10/12 0425 01/09/12 0432  WBC 5.1 4.8 4.5 5.7 8.2  NEUTROABS -- -- -- -- --  HGB 14.4 14.1 13.4 13.5 13.5  HCT 41.1 40.7 38.1* 38.5* 38.1*  MCV 86.3 87.3 86.2 85.9 86.4  PLT 198 188 183 195 179   Cardiac Enzymes:  Lab 01/07/12 1818 01/07/12 1127 01/07/12 0724  CKTOTAL -- -- --  CKMB -- -- --    CKMBINDEX -- -- --  TROPONINI <0.30 <0.30 <0.30   BNP (last 3 results) No results found for this basename: PROBNP:3 in the last 8760 hours CBG: No results found for this basename: GLUCAP:5 in the last 168 hours  No results found for this or any previous visit (from the past 240 hour(s)).   Studies: Ct Abdomen Pelvis W Contrast  01/12/2012  *RADIOLOGY REPORT*  Clinical Data: Pulmonary embolus with concern for underlying malignancy  CT ABDOMEN AND PELVIS WITH CONTRAST  Technique:  Multidetector CT imaging of the abdomen and pelvis was performed following the standard protocol during bolus administration of intravenous contrast. Oral contrast was also administered.  Contrast: 80mL OMNIPAQUE IOHEXOL 300 MG/ML  SOLN  Comparison: May 18, 2007  Findings: There is a pleural effusion on the left with left lower lobe consolidation.  There is patchy infiltrate in the right base as well.  There is a 5 mm cyst in the anterior segment right lobe of the liver which is stable.  No other liver lesions are identified. There is no biliary duct dilatation.  Spleen, pancreas, and right adrenal appear normal.  There is mild left adrenal hypertrophy, a stable finding.  Kidneys bilaterally show no mass or hydronephrosis.  In the pelvis, there is no mass or fluid collection.  Appendix is absent.  There is no bowel obstruction.  No free air or portal venous air. There is stool throughout the colon.  There is no ascites, adenopathy, or abscess in the abdomen or pelvis.  Aorta is nonaneurysmal.  There are no blastic or lytic bone lesions.  IMPRESSION: Left pleural effusion.  Infiltrate in both lung bases, more on the left than on the right.  No neoplastic focus is appreciated in the abdomen or pelvis.  There is no inflammatory focus in the abdomen or pelvis.  There is a large amount of stool in the colon.  A small stable cyst is noted in the right lobe of the liver incidentally.   Original Report Authenticated By: Bretta Bang, M.D.     Scheduled Meds:    . chlorpheniramine-HYDROcodone  5 mL Oral Q8H  . pantoprazole  40 mg Oral Q1200  . [COMPLETED] warfarin  12.5 mg Oral ONCE-1800  . Warfarin - Pharmacist Dosing Inpatient   Does not apply q1800   Continuous Infusions:    . sodium chloride 100 mL/hr (01/13/12 0700)  . [DISCONTINUED] heparin 1,650 Units/hr (01/12/12 1712)    Principal Problem:  *Acute Bilateral PE (pulmonary embolism) Active Problems:  CAP (community acquired pneumonia)  Pleuritic Chest Pain   Acute DVT Right LE  Right atrial thrombus    Time spent: >  30 minutes    Penny Pia  [facility] Pager [phone]. If 8PM-8AM, please contact night-coverage at [email or web address], password [facility] 01/13/2012, 9:53 AM  LOS: 6 days

## 2012-01-14 LAB — CBC
Hemoglobin: 14.1 g/dL (ref 13.0–17.0)
Platelets: 199 10*3/uL (ref 150–400)
RBC: 4.63 MIL/uL (ref 4.22–5.81)
WBC: 5 10*3/uL (ref 4.0–10.5)

## 2012-01-14 LAB — PROTIME-INR
INR: 2.28 — ABNORMAL HIGH (ref 0.00–1.49)
Prothrombin Time: 24.1 seconds — ABNORMAL HIGH (ref 11.6–15.2)

## 2012-01-14 MED ORDER — HYDROMORPHONE HCL PF 1 MG/ML IJ SOLN
0.5000 mg | Freq: Once | INTRAMUSCULAR | Status: AC
  Administered 2012-01-14: 0.5 mg via INTRAVENOUS
  Filled 2012-01-14: qty 1

## 2012-01-14 MED ORDER — WARFARIN SODIUM 7.5 MG PO TABS: 7.5000 mg | ORAL_TABLET | Freq: Every day | ORAL | Status: DC

## 2012-01-14 MED ORDER — WARFARIN SODIUM 7.5 MG PO TABS
7.5000 mg | ORAL_TABLET | Freq: Once | ORAL | Status: DC
  Filled 2012-01-14: qty 1

## 2012-01-14 MED ORDER — OXYCODONE HCL 5 MG PO TABS: 5.0000 mg | ORAL_TABLET | ORAL | Status: DC | PRN

## 2012-01-14 NOTE — Progress Notes (Signed)
Pt having increasing pain during shift. Medicated multiple times with PRN pain meds. Pt awakens at 0600 with "same type pain that brought me in" pt states no relief from meds given. Notified hospitalist. VSS. New order received. Will monitor.

## 2012-01-14 NOTE — Progress Notes (Signed)
ANTICOAGULATION CONSULT NOTE - Follow Up Consult  Pharmacy Consult for Coumadin Indication: PE + RLE DVT  No Known Allergies  Patient Measurements: Height: 5\' 6"  (167.6 cm) Weight: 185 lb 13.6 oz (84.3 kg) IBW/kg (Calculated) : 63.8   Vital Signs: Temp: 97.8 F (36.6 C) (11/17 1211) Temp src: Oral (11/17 1211) BP: 111/70 mmHg (11/17 0626)  Labs:  Basename 01/14/12 0535 01/13/12 0421 01/12/12 1126 01/12/12 0600  HGB 14.1 14.4 -- --  HCT 40.2 41.1 -- 40.7  PLT 199 198 -- 188  APTT -- -- -- --  LABPROT 24.1* 24.7* -- 21.1*  INR 2.28* 2.35* -- 1.90*  HEPARINUNFRC -- 0.71* 0.48 0.49  CREATININE -- -- -- --  CKTOTAL -- -- -- --  CKMB -- -- -- --  TROPONINI -- -- -- --    Estimated Creatinine Clearance: 90.1 ml/min (by C-G formula based on Cr of 1.01).   Medications:  Scheduled:     . chlorpheniramine-HYDROcodone  5 mL Oral Q8H  . [COMPLETED]  HYDROmorphone (DILAUDID) injection  0.5 mg Intravenous Once  . pantoprazole  40 mg Oral Q1200  . sodium chloride  3 mL Intravenous Q12H  . sodium chloride  3 mL Intravenous Q12H  . [COMPLETED] warfarin  7.5 mg Oral ONCE-1800  . Warfarin - Pharmacist Dosing Inpatient   Does not apply q1800   Infusions:     Assessment: 51 yo male with PE/DVT on coumadin and INR= 2.48. Noted plans for discharge today on 7.5mg /day.  Goal of Therapy:  Heparin level 0.3-0.7 units/ml Monitor platelets by anticoagulation protocol: Yes   Plan:  -Coumadin 7.5mg  po today (will give in hospital so patient can obtain his prescription in am) -Daily PT/INR  Harland German, Pharm D 01/14/2012 1:24 PM

## 2012-01-14 NOTE — Discharge Summary (Addendum)
Physician Discharge Summary  Daniel Glover NWG:[id number] DOB: [birth year] DOA: 01/07/2012  PCP: Sheila Oats, MD  Admit date: 01/07/2012 Discharge date: 01/14/2012  Time spent: 40 minutes  Recommendations for Outpatient Follow-up:  1. Please be sure to follow up with INR levels  2. Also be sure patient has follow up with hematologist in the next 1-2 weeks 3. Addendum: Please help set up colonoscopy as outpatient for further evaluation given recent history of clot burden.  Discharge Diagnoses:  Principal Problem:  *Acute Bilateral PE (pulmonary embolism) Active Problems:  Pleuritic Chest Pain   Acute DVT Right LE  Right atrial thrombus   Discharge Condition: stable  Diet recommendation: general diet  Filed Weights   01/07/12 0336 01/07/12 0625 01/09/12 1450  Weight: 77.111 kg (170 lb) 77.111 kg (170 lb) 84.3 kg (185 lb 13.6 oz)    History of present illness:  From original HPI: Daniel Glover is a 51 y.o. male  has a past medical history of Pneumonia.  Presented with  He woke up with pleuritic chest pain and shortness of breath. CXR worrisome for PNA. Pain is on the left side of the chest. Denies any fever. NO cough. He was mildly hypoxic on RA down to 92%.  No recent trips. No leg swelling. Denies any weight loss or night sweats. Patient states that he have had similar presentations in the past but the chest pain have been not as severe.    Hospital Course:  Daniel Glover is a 51 y.o. male smoker with no prior medical history admitted on 01/07/12 with acute onset of shortness of breath and Left sided chest pain, as well as a 3 week history of right lower extremity pain. A CXR showed bilateral PE, with subsequent CT angio of the Chest on 11/10 revealing extensive pulmonary embolus within pulmonary arteries to all lobes of both lungs, with clot burden on the right greater than left with pulmonary embolus in the right main pulmonary artery, extending into segmental and  subsegmental branches. More distal left sided pulmonary emboli seen.Pulmonary infarcts of the left lingula and left lower lobe were noted. A 2 D echo showed a mobile density in the RA of unclear etiology, with no emergent need for TEE of IVC filter per cardiology and vascular notes. A bilateral lower extremity doppler showed RLE DVT and extensive pulmonary emboli R>L. He was placed on Heparin, bridging to Coumadin when Heparin becomes therapeutic   *Acute Bilateral PE (pulmonary embolism) with Rt LE DVT and large thrombus in right atrium :  - was on heparin drip and Coumadin. Pt is hemodynamically stable in SDU. On 01/13/12 his INR was 2.3 and as such heparin gtt discontinued and patient will be on coumadin for anticoagulation at this point given INR. Target INR 2-3. INR remained above 2 but below 3 for 2 consecutive days on day of discharge.   - Hypercoagulable panel shows elevated drvvt but lupus anticoagulant is negative.  - B-2-glycoprotein and cardiolipin antibody negative  - Tumor markers: AFP, CA 19-9, CEA, PSA negative.  - 2D Echo shows large thrombus in right atrium seems to extend from IVC up to septal leaflet of tricuspid valve.  - Cardiology, Vascular, PCCM, and hematology have evaluated patient while inhouse - CT of abdomen and pelvis negative for neoplastic focus in abdomen and pelvis  - Case discussed with Radiologist again 01/13/12. At this point Radiologist mentions that he has reviewed the case and the mass in patient's heart is most likely thrombus and he recommends no further  imaging modality at this time.  - Patient has been hemodynamically stable on coumadin and at this juncture may transition to home.  Have provided information on coumadin medication as well as hand out from website up to date for further information.  Will be discharged on coumadin 7.5 and is to follow up with PCP in 1 week to recheck INR. Patient is also to not exert himself once he transitions home and he has  verbalized his understanding and agreement.   Pleuritic Chest Pain  - Will plan on discharging patient on oxycodone. Most likely 2ary to pulmonary infarcts secondary to PE's and clot burden.   Procedures:  CT of abdomen and pelvis  CT angio chest  Consultations:  Vascular surgeon  Cardiologist  Hematologist  PCCM  Discharge Exam: Filed Vitals:   01/14/12 0351 01/14/12 0601 01/14/12 0626 01/14/12 0811  BP: 106/62  111/70   Pulse:      Temp: 98.2 F (36.8 C)   97.9 F (36.6 C)  TempSrc: Oral   Oral  Resp: 20 20  20   Height:      Weight:      SpO2: 93% 95%  97%    General: Pt in NAD, sitting up smiling. Alert and Oriented x 3 Cardiovascular: RRR, No MRG Respiratory: Breath sounds BL, no wheezes, speaking in full sentences, no increased work of breathing.  Discharge Instructions  Discharge Orders    Future Orders Please Complete By Expires   Diet - low sodium heart healthy      Increase activity slowly      Discharge instructions      Comments:   Please be sure to set up follow up appointment with a primary care physician of your choice within the next week you will need your INR monitored weekly unless otherwise indicated by your primary care physician.  Also be sure to set up follow up appointment with hematologist Pierce Crane tel # [phone] as you will require further evaluation and recommendations from the hematologist.   Call MD for:  temperature >100.4      Call MD for:  severe uncontrolled pain      Call MD for:  difficulty breathing, headache or visual disturbances          Medication List     As of 01/14/2012  9:43 AM    TAKE these medications         oxyCODONE 5 MG immediate release tablet   Commonly known as: Oxy IR/ROXICODONE   Take 1 tablet (5 mg total) by mouth every 4 (four) hours as needed for pain.      warfarin 7.5 MG tablet   Commonly known as: COUMADIN   Take 1 tablet (7.5 mg total) by mouth daily.          The results  of significant diagnostics from this hospitalization (including imaging, microbiology, ancillary and laboratory) are listed below for reference.    Significant Diagnostic Studies: Dg Chest 2 View  01/07/2012  *RADIOLOGY REPORT*  Clinical Data: Shortness of breath and left chest pain.  CHEST - 2 VIEW  Comparison: Chest radiograph performed 10/28/2011  Findings: The lungs are well-aerated.  Mild focal left lower lobe opacity is compatible with pneumonia.  There is no evidence of pleural effusion or pneumothorax.  The heart is normal in size; the mediastinal contour is within normal limits.  No acute osseous abnormalities are seen.  IMPRESSION: Mild left lower lobe pneumonia noted.   Original Report Authenticated By: Tinnie Gens  Cherly Hensen, M.D.    Ct Angio Chest Pe W/cm &/or Wo Cm  01/09/2012  **ADDENDUM** CREATED: 01/09/2012 08:06:18  Upon further review, in images 176 - 186 of series 6 demonstrate a filling defect in the medial aspect of the right atrium that measures approximately 1.8 x 1.1 x 1.3 cm.  This is in the lower third of the right atrium, and appears in close proximity to the septal leaflet of the tricuspid valve.  This appears to be separate from the interatrial septum, and is located just anterior/superior to the ostium of the coronary sinus.  Given the presence of bilateral pulmonary emboli, this is statistically most likely to represent a right atrial thrombus.  This was discussed by phone with Dr. Herbie Baltimore of Cardiology by Dr. Llana Aliment on 01/08/2012 at 08:50 p.m.  **END ADDENDUM** SIGNED BY: Florencia Reasons, M.D.   01/07/2012  *RADIOLOGY REPORT*  Clinical Data: Left-sided pleuritic chest pain and shortness of breath.  CT ANGIOGRAPHY CHEST  Technique:  Multidetector CT imaging of the chest using the standard protocol during bolus administration of intravenous contrast. Multiplanar reconstructed images including MIPs were obtained and reviewed to evaluate the vascular anatomy.  Contrast:  OMNIPAQUE IOHEXOL 350 MG/ML SOLN  Comparison: Chest radiograph performed earlier today at 02:35 a.m.  Findings: There is prominent pulmonary embolus within pulmonary arteries to all lobes of both lungs.  There is greater clot burden on the right, with pulmonary embolus noted in the right main pulmonary artery, extending into segmental and subsegmental branches.  More distal left sided pulmonary emboli are seen.  Airspace opacification within the left lingula and left lower lobe is compatible with pulmonary infarct.  Mild right basilar airspace opacity may also reflect pulmonary infarct, though there is some degree of associated bleb formation, of uncertain significance. There is no evidence of pleural effusion or pneumothorax.  No masses are identified; no abnormal focal contrast enhancement is seen.  The mediastinum is unremarkable in appearance.  No mediastinal lymphadenopathy is seen.  No pericardial effusion is identified. The great vessels are unremarkable in appearance.  No axillary lymphadenopathy is seen.  The visualized portions of the thyroid gland are unremarkable in appearance.  The visualized portions of the liver and spleen are unremarkable. The visualized portions of the pancreas, stomach, adrenal glands and kidneys are within normal limits.  No acute osseous abnormalities are seen.  IMPRESSION:  1.  Prominent pulmonary embolus within pulmonary arteries to all lobes of both lungs.  Greater clot burden noted on the right, with pulmonary embolus in the right main pulmonary artery, extending into segmental and subsegmental branches.  More distal left sided pulmonary emboli seen. 2.  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  Critical Value/emergent results were called by telephone at the time of interpretation on 01/07/2012 at 05:59 a.m. to Dr. Sunnie Nielsen, who  verbally acknowledged these results.  Original Report Authenticated By: Tonia Ghent, M.D.    Ct Abdomen Pelvis W Contrast  01/12/2012  *RADIOLOGY REPORT*  Clinical Data: Pulmonary embolus with concern for underlying malignancy  CT ABDOMEN AND PELVIS WITH CONTRAST  Technique:  Multidetector CT imaging of the abdomen and pelvis was performed following the standard protocol during bolus administration of intravenous contrast. Oral contrast was also administered.  Contrast: 80mL OMNIPAQUE IOHEXOL 300 MG/ML  SOLN  Comparison: May 18, 2007  Findings: There is a pleural effusion on the  left with left lower lobe consolidation.  There is patchy infiltrate in the right base as well.  There is a 5 mm cyst in the anterior segment right lobe of the liver which is stable.  No other liver lesions are identified. There is no biliary duct dilatation.  Spleen, pancreas, and right adrenal appear normal.  There is mild left adrenal hypertrophy, a stable finding.  Kidneys bilaterally show no mass or hydronephrosis.  In the pelvis, there is no mass or fluid collection.  Appendix is absent.  There is no bowel obstruction.  No free air or portal venous air. There is stool throughout the colon.  There is no ascites, adenopathy, or abscess in the abdomen or pelvis.  Aorta is nonaneurysmal.  There are no blastic or lytic bone lesions.  IMPRESSION: Left pleural effusion.  Infiltrate in both lung bases, more on the left than on the right.  No neoplastic focus is appreciated in the abdomen or pelvis.  There is no inflammatory focus in the abdomen or pelvis.  There is a large amount of stool in the colon.  A small stable cyst is noted in the right lobe of the liver incidentally.   Original Report Authenticated By: Bretta Bang, M.D.     Microbiology: No results found for this or any previous visit (from the past 240 hour(s)).   Labs: Basic Metabolic Panel:  Lab 01/10/12 1191 01/09/12 0432 01/08/12 0537  NA 136 136 137  K  4.1 4.0 3.8  CL 99 100 102  CO2 26 25 25   GLUCOSE 83 84 85  BUN 12 12 12   CREATININE 1.01 0.97 1.04  CALCIUM 8.6 8.8 8.7  MG -- -- --  PHOS -- -- --   Liver Function Tests: No results found for this basename: AST:5,ALT:5,ALKPHOS:5,BILITOT:5,PROT:5,ALBUMIN:5 in the last 168 hours No results found for this basename: LIPASE:5,AMYLASE:5 in the last 168 hours No results found for this basename: AMMONIA:5 in the last 168 hours CBC:  Lab 01/14/12 0535 01/13/12 0421 01/12/12 0600 01/11/12 0425 01/10/12 0425  WBC 5.0 5.1 4.8 4.5 5.7  NEUTROABS -- -- -- -- --  HGB 14.1 14.4 14.1 13.4 13.5  HCT 40.2 41.1 40.7 38.1* 38.5*  MCV 86.8 86.3 87.3 86.2 85.9  PLT 199 198 188 183 195   Cardiac Enzymes:  Lab 01/07/12 1818 01/07/12 1127  CKTOTAL -- --  CKMB -- --  CKMBINDEX -- --  TROPONINI <0.30 <0.30   BNP: BNP (last 3 results) No results found for this basename: PROBNP:3 in the last 8760 hours CBG: No results found for this basename: GLUCAP:5 in the last 168 hours     Signed:  Penny Pia  [facility] 01/14/2012, 9:43 AM    Addendum: Hematologist also recommended that patient have colonoscopy as outpatient for further evaluation.

## 2012-01-18 ENCOUNTER — Other Ambulatory Visit: Payer: Self-pay | Admitting: Lab

## 2012-01-18 ENCOUNTER — Inpatient Hospital Stay: Payer: Self-pay | Admitting: Oncology

## 2012-01-21 ENCOUNTER — Other Ambulatory Visit: Payer: Self-pay | Admitting: Oncology

## 2012-01-21 DIAGNOSIS — I2699 Other pulmonary embolism without acute cor pulmonale: Secondary | ICD-10-CM

## 2012-01-22 ENCOUNTER — Ambulatory Visit (HOSPITAL_BASED_OUTPATIENT_CLINIC_OR_DEPARTMENT_OTHER): Payer: Self-pay | Admitting: Oncology

## 2012-01-22 ENCOUNTER — Telehealth: Payer: Self-pay | Admitting: *Deleted

## 2012-01-22 ENCOUNTER — Other Ambulatory Visit (HOSPITAL_BASED_OUTPATIENT_CLINIC_OR_DEPARTMENT_OTHER): Payer: Self-pay

## 2012-01-22 ENCOUNTER — Encounter: Payer: Self-pay | Admitting: Oncology

## 2012-01-22 VITALS — BP 126/75 | HR 69 | Temp 98.1°F | Resp 20 | Ht 66.0 in | Wt 180.0 lb

## 2012-01-22 DIAGNOSIS — I2699 Other pulmonary embolism without acute cor pulmonale: Secondary | ICD-10-CM

## 2012-01-22 LAB — CBC WITH DIFFERENTIAL/PLATELET
Basophils Absolute: 0 10*3/uL (ref 0.0–0.1)
EOS%: 2 % (ref 0.0–7.0)
Eosinophils Absolute: 0.2 10*3/uL (ref 0.0–0.5)
HGB: 15.3 g/dL (ref 13.0–17.1)
NEUT#: 5.3 10*3/uL (ref 1.5–6.5)
RBC: 5.08 10*6/uL (ref 4.20–5.82)
RDW: 13.5 % (ref 11.0–14.6)
lymph#: 1.8 10*3/uL (ref 0.9–3.3)
nRBC: 0 % (ref 0–0)

## 2012-01-22 LAB — PROTIME-INR
INR: 3.5 (ref 2.00–3.50)
Protime: 42 Seconds — ABNORMAL HIGH (ref 10.6–13.4)

## 2012-01-22 MED ORDER — WARFARIN SODIUM 7.5 MG PO TABS: 7.5000 mg | ORAL_TABLET | Freq: Every day | ORAL | Status: DC

## 2012-01-22 NOTE — Telephone Encounter (Signed)
Gave patient appointment for appointment for weekly lab

## 2012-01-22 NOTE — Progress Notes (Signed)
Patient came in.He is uninsured. I gave him a Medicaid application as well as a . Darlena explained process to him. He will turn in to DSS the medicaid and then mail to [address] for [facility]. He works part time here and there. It is only him in the household.

## 2012-01-24 NOTE — Progress Notes (Signed)
Hematology and Oncology Follow Up Visit  Daniel Glover [id number] 07/03/1960 51 y.o. 01/24/2012 8:27 AM   DIAGNOSIS:   Encounter Diagnosis  Name Primary?  . Acute Bilateral PE (pulmonary embolism) Yes   This is a 51 year old African American male who was seen in the hospital by myself if you weeks ago. He presented with atypical left chest pain that actually had been on for number of weeks. He had a previous visits the orbits alert and obtained a number of months prior to this admission. He was also found to have bilateral pulmonary embolism. He was found to have an questionable right atrial thrombus and in addition he had a Doppler of his right leg which showed a blood clot as well. He had no obvious resisting conditions that would give rise to this. His workup in the hospital was negative including CT scan of the abdomen and chest. A hypercoagulable workup to date has been negative as well. He was placed on heparin ultimately is been on Coumadin and he is on 7.5 mg daily.   PAST THERAPY:  As above  Interim History:  Patient returns for followup. He is been discharged from a week from the hospital. He is been doing well on Coumadin he continues to have some atypical left chest pain especially takes a deep breath and he describes this with the recurrent component.  Medications: I have reviewed the patient's current medications.  Allergies: No Known Allergies  Past Medical History, Surgical history, Social history, and Family History were reviewed and updated.  Review of Systems: Constitutional:  Negative for fever, chills, night sweats, anorexia, weight loss, pain. Cardiovascular: positive for - chest pain Respiratory: positive for - shortness of breath Neurological: negative Dermatological: negative ENT: negative Skin Gastrointestinal: negative Genito-Urinary: negative Hematological and Lymphatic: negative Breast: negative Musculoskeletal: negative Remaining ROS  negative.  Physical Exam:  Blood pressure 126/75, pulse 69, temperature 98.1 F (36.7 C), temperature source Oral, resp. rate 20, height 5\' 6"  (1.676 m), weight 180 lb (81.647 kg).  ECOG: 1   HEENT:  Sclerae anicteric, conjunctivae pink.  Oropharynx clear.  No mucositis or candidiasis.  Nodes:  No cervical, supraclavicular, or axillary lymphadenopathy palpated.  No masses, discharge, skin change, or nipple inversion..  Lungs:  Clear to auscultation bilaterally.  No crackles, rhonchi, or wheezes.  Heart:  Regular rate and rhythm, no rubs heard..  Abdomen:  Soft, nontender.  Positive bowel sounds.  No organomegaly or masses palpated.  Musculoskeletal:  No focal spinal tenderness to palpation.  Extremities:  Benign.  No peripheral edema or cyanosis.  Skin:  Benign.  Neuro:  Nonfocal.    Lab Results: Lab Results  Component Value Date   WBC 7.8 01/22/2012   HGB 15.3 01/22/2012   HCT 43.4 01/22/2012   MCV 85.4 01/22/2012   PLT 346 01/22/2012     Chemistry      Component Value Date/Time   NA 136 01/10/2012 0425   K 4.1 01/10/2012 0425   CL 99 01/10/2012 0425   CO2 26 01/10/2012 0425   BUN 12 01/10/2012 0425   CREATININE 1.01 01/10/2012 0425      Component Value Date/Time   CALCIUM 8.6 01/10/2012 0425   ALKPHOS 71 01/07/2012 0724   AST 20 01/07/2012 0724   ALT 20 01/07/2012 0724   BILITOT 1.2 01/07/2012 0724       Radiological Studies:  Dg Chest 2 View  01/07/2012  *RADIOLOGY REPORT*  Clinical Data: Shortness of breath and left chest pain.  CHEST - 2 VIEW  Comparison: Chest radiograph performed 10/28/2011  Findings: The lungs are well-aerated.  Mild focal left lower lobe opacity is compatible with pneumonia.  There is no evidence of pleural effusion or pneumothorax.  The heart is normal in size; the mediastinal contour is within normal limits.  No acute osseous abnormalities are seen.  IMPRESSION: Mild left lower lobe pneumonia noted.   Original Report Authenticated By: Tonia Ghent, M.D.    Ct Angio Chest Pe W/cm &/or Wo Cm  01/09/2012  **ADDENDUM** CREATED: 01/09/2012 08:06:18  Upon further review, in images 176 - 186 of series 6 demonstrate a filling defect in the medial aspect of the right atrium that measures approximately 1.8 x 1.1 x 1.3 cm.  This is in the lower third of the right atrium, and appears in close proximity to the septal leaflet of the tricuspid valve.  This appears to be separate from the interatrial septum, and is located just anterior/superior to the ostium of the coronary sinus.  Given the presence of bilateral pulmonary emboli, this is statistically most likely to represent a right atrial thrombus.  This was discussed by phone with Dr. Herbie Baltimore of Cardiology by Dr. Llana Aliment on 01/08/2012 at 08:50 p.m.  **END ADDENDUM** SIGNED BY: Florencia Reasons, M.D.   01/07/2012  *RADIOLOGY REPORT*  Clinical Data: Left-sided pleuritic chest pain and shortness of breath.  CT ANGIOGRAPHY CHEST  Technique:  Multidetector CT imaging of the chest using the standard protocol during bolus administration of intravenous contrast. Multiplanar reconstructed images including MIPs were obtained and reviewed to evaluate the vascular anatomy.  Contrast: OMNIPAQUE IOHEXOL 350 MG/ML SOLN  Comparison: Chest radiograph performed earlier today at 02:35 a.m.  Findings: There is prominent pulmonary embolus within pulmonary arteries to all lobes of both lungs.  There is greater clot burden on the right, with pulmonary embolus noted in the right main pulmonary artery, extending into segmental and subsegmental branches.  More distal left sided pulmonary emboli are seen.  Airspace opacification within the left lingula and left lower lobe is compatible with pulmonary infarct.  Mild right basilar airspace opacity may also reflect pulmonary infarct, though there is some degree of associated bleb formation, of uncertain significance. There is no evidence of pleural effusion or pneumothorax.  No  masses are identified; no abnormal focal contrast enhancement is seen.  The mediastinum is unremarkable in appearance.  No mediastinal lymphadenopathy is seen.  No pericardial effusion is identified. The great vessels are unremarkable in appearance.  No axillary lymphadenopathy is seen.  The visualized portions of the thyroid gland are unremarkable in appearance.  The visualized portions of the liver and spleen are unremarkable. The visualized portions of the pancreas, stomach, adrenal glands and kidneys are within normal limits.  No acute osseous abnormalities are seen.  IMPRESSION:  1.  Prominent pulmonary embolus within pulmonary arteries to all lobes of both lungs.  Greater clot burden noted on the right, with pulmonary embolus in the right main pulmonary artery, extending into segmental and subsegmental branches.  More distal left sided pulmonary emboli seen. 2.  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  Critical Value/emergent results were called by telephone at the time of interpretation on 01/07/2012 at 05:59 a.m. to Dr. Sunnie Nielsen, who verbally acknowledged these results.  Original Report Authenticated By: Tonia Ghent, M.D.    Ct Abdomen Pelvis W Contrast  01/12/2012  *RADIOLOGY REPORT*  Clinical Data: Pulmonary embolus with concern for underlying malignancy  CT ABDOMEN AND PELVIS WITH CONTRAST  Technique:  Multidetector CT imaging of the abdomen and pelvis was performed following the standard protocol during bolus administration of intravenous contrast. Oral contrast was also administered.  Contrast: 80mL OMNIPAQUE IOHEXOL 300 MG/ML  SOLN  Comparison: May 18, 2007  Findings: There is a pleural effusion on the left with left lower lobe consolidation.  There is patchy infiltrate in the right base as well.  There is a 5 mm cyst in the anterior segment right  lobe of the liver which is stable.  No other liver lesions are identified. There is no biliary duct dilatation.  Spleen, pancreas, and right adrenal appear normal.  There is mild left adrenal hypertrophy, a stable finding.  Kidneys bilaterally show no mass or hydronephrosis.  In the pelvis, there is no mass or fluid collection.  Appendix is absent.  There is no bowel obstruction.  No free air or portal venous air. There is stool throughout the colon.  There is no ascites, adenopathy, or abscess in the abdomen or pelvis.  Aorta is nonaneurysmal.  There are no blastic or lytic bone lesions.  IMPRESSION: Left pleural effusion.  Infiltrate in both lung bases, more on the left than on the right.  No neoplastic focus is appreciated in the abdomen or pelvis.  There is no inflammatory focus in the abdomen or pelvis.  There is a large amount of stool in the colon.  A small stable cyst is noted in the right lobe of the liver incidentally.   Original Report Authenticated By: Bretta Bang, M.D.      IMPRESSIONS AND PLAN: A 51 y.o. 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 At this point he has no medical insurance and he needs to get a since recovered. He would benefit from colonoscopy that given his age. No GI symptoms to suggest underlying colonic malignancy. We will review the hypercoagulable workup.  We also should consider a length of anticoagulation which would be at least one year. If there is no predisposing factors I would have him to [facility] to confirm the length of anticoagulation. Given the potential life-threatening nature of the emboli I would be concerned about stopping the Coumadin prematurely. You also need to reimage his heart to determine whether the atrial thrombus is resolving. I plan to see him back in 2 months time we will check his pro time is weekly this point.   Spent more than half the time coordinating care, as well as discussion of BMI and its  implications.      Valentin Benney 11/27/20138:27 AM Cell [phone]

## 2012-01-29 ENCOUNTER — Telehealth: Payer: Self-pay | Admitting: *Deleted

## 2012-01-29 ENCOUNTER — Other Ambulatory Visit (HOSPITAL_BASED_OUTPATIENT_CLINIC_OR_DEPARTMENT_OTHER): Payer: Self-pay

## 2012-01-29 ENCOUNTER — Other Ambulatory Visit: Payer: Self-pay | Admitting: *Deleted

## 2012-01-29 DIAGNOSIS — I2699 Other pulmonary embolism without acute cor pulmonale: Secondary | ICD-10-CM

## 2012-01-29 LAB — PROTIME-INR: INR: 1.7 — ABNORMAL LOW (ref 2.00–3.50)

## 2012-01-29 MED ORDER — WARFARIN SODIUM 7.5 MG PO TABS: 7.5000 mg | ORAL_TABLET | Freq: Every day | ORAL | Status: DC

## 2012-01-30 ENCOUNTER — Other Ambulatory Visit: Payer: Self-pay | Admitting: *Deleted

## 2012-01-30 MED ORDER — WARFARIN SODIUM 7.5 MG PO TABS: 7.5000 mg | ORAL_TABLET | Freq: Every day | ORAL | Status: DC

## 2012-02-05 ENCOUNTER — Other Ambulatory Visit (HOSPITAL_BASED_OUTPATIENT_CLINIC_OR_DEPARTMENT_OTHER): Payer: Self-pay

## 2012-02-05 ENCOUNTER — Encounter: Payer: Self-pay | Admitting: *Deleted

## 2012-02-05 DIAGNOSIS — I2699 Other pulmonary embolism without acute cor pulmonale: Secondary | ICD-10-CM

## 2012-02-05 LAB — PROTIME-INR

## 2012-02-05 NOTE — Progress Notes (Signed)
Called and spoke with patient concerning his PT/INR.  Per Dr. Donnie Coffin patient is not take Coumadin tonight 12/9 and start 7.5mg  12/10 daily and will recheck next week. Pt. Aware of appt.s

## 2012-02-12 ENCOUNTER — Other Ambulatory Visit (HOSPITAL_BASED_OUTPATIENT_CLINIC_OR_DEPARTMENT_OTHER): Payer: Self-pay

## 2012-02-12 DIAGNOSIS — I2699 Other pulmonary embolism without acute cor pulmonale: Secondary | ICD-10-CM

## 2012-02-12 LAB — PROTIME-INR: INR: 3.2 (ref 2.00–3.50)

## 2012-02-17 ENCOUNTER — Telehealth: Payer: Self-pay | Admitting: *Deleted

## 2012-02-17 NOTE — Telephone Encounter (Signed)
patient confirmed over the phone the same date just different provider

## 2012-02-19 ENCOUNTER — Other Ambulatory Visit: Payer: Self-pay | Admitting: *Deleted

## 2012-02-19 ENCOUNTER — Other Ambulatory Visit (HOSPITAL_BASED_OUTPATIENT_CLINIC_OR_DEPARTMENT_OTHER): Payer: Self-pay

## 2012-02-19 DIAGNOSIS — I2699 Other pulmonary embolism without acute cor pulmonale: Secondary | ICD-10-CM

## 2012-02-19 LAB — PROTIME-INR: Protime: 52.8 Seconds — ABNORMAL HIGH (ref 10.6–13.4)

## 2012-02-19 MED ORDER — WARFARIN SODIUM 5 MG PO TABS: 5.0000 mg | ORAL_TABLET | Freq: Every day | ORAL | Status: DC

## 2012-02-19 MED ORDER — WARFARIN SODIUM 1 MG PO TABS: 1.0000 mg | ORAL_TABLET | ORAL | Status: DC

## 2012-02-19 NOTE — Progress Notes (Signed)
Called and spoke with patient about his PT/INR.  Per Dr. Donnie Coffin patient to hold Coumadin tonight and restart 6mg  12/24 and alternate with 7mg .  Patient verbalized understanding.  Also made a referral to our coumadin clinic.

## 2012-02-26 ENCOUNTER — Other Ambulatory Visit (HOSPITAL_BASED_OUTPATIENT_CLINIC_OR_DEPARTMENT_OTHER): Payer: Self-pay

## 2012-02-26 ENCOUNTER — Ambulatory Visit (HOSPITAL_BASED_OUTPATIENT_CLINIC_OR_DEPARTMENT_OTHER): Payer: Self-pay | Admitting: Pharmacist

## 2012-02-26 DIAGNOSIS — I2699 Other pulmonary embolism without acute cor pulmonale: Secondary | ICD-10-CM

## 2012-02-26 LAB — POCT INR: INR: 3.1

## 2012-02-26 LAB — PROTIME-INR: Protime: 37.2 Seconds — ABNORMAL HIGH (ref 10.6–13.4)

## 2012-02-26 NOTE — Progress Notes (Signed)
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  He fasts for 3 days at the end of each month (Thurs-Sun). Vit K intake now is low but he'd like to incorporate more back into his diet. Occasional EtOH (wine w/ dinner). I provided a handout of vit K content in food; he counts calories so he was familiar w/ serving sizes & vit K content.  He'll work on adding in vit K & I stressed importance of consistent & even amts of vit K from week to week, avoiding drastic changes (of course, if he fasts we need to know). Pt will alert [place] of his fasts & we will need to monitor INR more closely during those times. INR today is fine.  I did not change his dose of Coumadin. Repeat INR on 03/07/12  Marily Lente, Pharm.D.

## 2012-03-02 ENCOUNTER — Encounter: Payer: Self-pay | Admitting: Internal Medicine

## 2012-03-07 ENCOUNTER — Ambulatory Visit (HOSPITAL_BASED_OUTPATIENT_CLINIC_OR_DEPARTMENT_OTHER): Payer: Self-pay | Admitting: Pharmacist

## 2012-03-07 ENCOUNTER — Other Ambulatory Visit (HOSPITAL_BASED_OUTPATIENT_CLINIC_OR_DEPARTMENT_OTHER): Payer: Self-pay

## 2012-03-07 DIAGNOSIS — I2699 Other pulmonary embolism without acute cor pulmonale: Secondary | ICD-10-CM

## 2012-03-07 LAB — PROTIME-INR
INR: 2.9 (ref 2.00–3.50)
Protime: 34.8 Seconds — ABNORMAL HIGH (ref 10.6–13.4)

## 2012-03-07 NOTE — Patient Instructions (Addendum)
Continue alternating dose of 6 mg & 7 mg every day Return on 03/29/12 at 1:45 pm for lab; 2:00 pm for Coumadin clinic and 2:15 APP.

## 2012-03-07 NOTE — Progress Notes (Signed)
PT seen today with no changes to report.  He is therapeutic at 2.9 when alternating between 6/7mg  each day.  We will continue this alternating dose.  He will be seen back in clinic on 03/29/12 when he will be seen by Tiana Loft.

## 2012-03-29 ENCOUNTER — Ambulatory Visit (HOSPITAL_BASED_OUTPATIENT_CLINIC_OR_DEPARTMENT_OTHER): Payer: Self-pay | Admitting: Pharmacist

## 2012-03-29 ENCOUNTER — Ambulatory Visit: Payer: Self-pay | Admitting: Oncology

## 2012-03-29 ENCOUNTER — Ambulatory Visit (HOSPITAL_BASED_OUTPATIENT_CLINIC_OR_DEPARTMENT_OTHER): Payer: Self-pay | Admitting: Physician Assistant

## 2012-03-29 ENCOUNTER — Other Ambulatory Visit: Payer: Self-pay | Admitting: Lab

## 2012-03-29 ENCOUNTER — Encounter: Payer: Self-pay | Admitting: Physician Assistant

## 2012-03-29 VITALS — BP 114/75 | HR 85 | Temp 98.1°F | Resp 20 | Ht 66.0 in | Wt 186.9 lb

## 2012-03-29 DIAGNOSIS — I2699 Other pulmonary embolism without acute cor pulmonale: Secondary | ICD-10-CM

## 2012-03-29 LAB — PROTIME-INR
INR: 1.8 — ABNORMAL LOW (ref 2.00–3.50)
Protime: 21.6 Seconds — ABNORMAL HIGH (ref 10.6–13.4)

## 2012-03-29 NOTE — Patient Instructions (Addendum)
Continue coumadin per Coumadin Clinic directions Follow up wit Dr. Darrold Span in 4 to 6 weeks

## 2012-03-30 NOTE — Patient Instructions (Signed)
Take 8mg  today. On 03/30/12, will begin a set dose per day of 7mg  daily except 6mg  on MWF. Hopefully this will make it easier for you to remember what coumadin dose to take each day. Return on 04/12/12 at 1:00 pm for lab; 1:45 pm for Coumadin clinic.

## 2012-03-30 NOTE — Progress Notes (Signed)
INR slightly below goal today. Pt slept through both alarms reminding him to take his coumadin and missed his dose last night. No problems to report. He will take 8mg  today. On 03/30/12, will begin a set dose per day of 7mg  daily except 6mg  on MWF. Pt thought this schedule may help him remember what dose to take each day. He was having trouble remembering what dose to take with the alternating 6mg /7mg  schedule. Return on 04/12/12 at 1:00 pm for lab; 1:45 pm for Coumadin clinic.  Prescription refills on his warfarin 1mg  and 5mg  tablets were called to the [facility] on [address]. Phone = [phone]

## 2012-03-31 NOTE — Progress Notes (Signed)
Hematology and Oncology Follow Up Visit  Daniel Glover [id number] 1960/05/30 52 y.o. 03/31/2012 11:25 PM   DIAGNOSIS:   Encounter Diagnosis  Name Primary?  . Acute Bilateral PE (pulmonary embolism) Yes   This is a 52 year old [place] American male who found to have bilateral pulmonary embolism. He was found to have an questionable right atrial thrombus and in addition he had a Doppler of his right leg which showed a blood clot as well. He had no obvious resisting conditions that would give rise to this. His workup in the hospital was negative including CT scan of the abdomen and chest. A hypercoagulable workup to date has been negative as well. He had been working as a Marketing executive. He is currently taking Coumadin 6 mg alternating with 7 mg daily followed by the [facility] Coumadin clinic. He denies any chest pain, shortness of breath, cough or hemoptysis. He does occasionally have some sinus problems. He reports smoking one to 3 cigarettes daily. He is not married he has 3 children ages 94, 73 and 35.  PAST THERAPY:  As above  Interim History:  Patient returns for followup.  He is been doing well on Coumadin. He voices no specific complaints today  Medications: I have reviewed the patient's current medications.  Allergies: No Known Allergies  Past Medical History, Surgical history, Social history, and Family History were reviewed and updated.  Review of Systems: Constitutional:  Negative for fever, chills, night sweats, anorexia, weight loss, pain. Cardiovascular: positive for - chest pain Respiratory: positive for - shortness of breath Neurological: negative Dermatological: negative ENT: negative Gastrointestinal: negative Genito-Urinary: negative Hematological and Lymphatic: negative Breast: negative Musculoskeletal: negative Remaining ROS negative.  Physical Exam:  Blood pressure 114/75, pulse 85, temperature 98.1 F (36.7 C), temperature source Oral,  resp. rate 20, height 5\' 6"  (1.676 m), weight 186 lb 14.4 oz (84.777 kg).  ECOG: 1   HEENT:  Sclerae anicteric, conjunctivae pink.  Oropharynx clear.  No mucositis or candidiasis.  Nodes:  No cervical, supraclavicular, or axillary lymphadenopathy palpated.  No masses, discharge, skin change, or nipple inversion..  Lungs:  Clear to auscultation bilaterally.  No crackles, rhonchi, or wheezes.  Heart:  Regular rate and rhythm, no rubs heard..  Abdomen:  Soft, nontender.  Positive bowel sounds.  No organomegaly or masses palpated.  Musculoskeletal:  No focal spinal tenderness to palpation.  Extremities:  Benign.  No peripheral edema or cyanosis.  Skin:  Benign.  Neuro:  Nonfocal.    Lab Results: Lab Results  Component Value Date   WBC 7.8 01/22/2012   HGB 15.3 01/22/2012   HCT 43.4 01/22/2012   MCV 85.4 01/22/2012   PLT 346 01/22/2012     Chemistry      Component Value Date/Time   NA 136 01/10/2012 0425   K 4.1 01/10/2012 0425   CL 99 01/10/2012 0425   CO2 26 01/10/2012 0425   BUN 12 01/10/2012 0425   CREATININE 1.01 01/10/2012 0425      Component Value Date/Time   CALCIUM 8.6 01/10/2012 0425   ALKPHOS 71 01/07/2012 0724   AST 20 01/07/2012 0724   ALT 20 01/07/2012 0724   BILITOT 1.2 01/07/2012 0724       Radiological Studies:  Dg Chest 2 View  01/07/2012  *RADIOLOGY REPORT*  Clinical Data: Shortness of breath and left chest pain.  CHEST - 2 VIEW  Comparison: Chest radiograph performed 10/28/2011  Findings: The lungs are well-aerated.  Mild focal left lower lobe  opacity is compatible with pneumonia.  There is no evidence of pleural effusion or pneumothorax.  The heart is normal in size; the mediastinal contour is within normal limits.  No acute osseous abnormalities are seen.  IMPRESSION: Mild left lower lobe pneumonia noted.   Original Report Authenticated By: Tonia Ghent, M.D.    Ct Angio Chest Pe W/cm &/or Wo Cm  01/09/2012  **ADDENDUM** CREATED: 01/09/2012 08:06:18   Upon further review, in images 176 - 186 of series 6 demonstrate a filling defect in the medial aspect of the right atrium that measures approximately 1.8 x 1.1 x 1.3 cm.  This is in the lower third of the right atrium, and appears in close proximity to the septal leaflet of the tricuspid valve.  This appears to be separate from the interatrial septum, and is located just anterior/superior to the ostium of the coronary sinus.  Given the presence of bilateral pulmonary emboli, this is statistically most likely to represent a right atrial thrombus.  This was discussed by phone with Dr. Herbie Baltimore of Cardiology by Dr. Llana Aliment on 01/08/2012 at 08:50 p.m.  **END ADDENDUM** SIGNED BY: Florencia Reasons, M.D.   01/07/2012  *RADIOLOGY REPORT*  Clinical Data: Left-sided pleuritic chest pain and shortness of breath.  CT ANGIOGRAPHY CHEST  Technique:  Multidetector CT imaging of the chest using the standard protocol during bolus administration of intravenous contrast. Multiplanar reconstructed images including MIPs were obtained and reviewed to evaluate the vascular anatomy.  Contrast: OMNIPAQUE IOHEXOL 350 MG/ML SOLN  Comparison: Chest radiograph performed earlier today at 02:35 a.m.  Findings: There is prominent pulmonary embolus within pulmonary arteries to all lobes of both lungs.  There is greater clot burden on the right, with pulmonary embolus noted in the right main pulmonary artery, extending into segmental and subsegmental branches.  More distal left sided pulmonary emboli are seen.  Airspace opacification within the left lingula and left lower lobe is compatible with pulmonary infarct.  Mild right basilar airspace opacity may also reflect pulmonary infarct, though there is some degree of associated bleb formation, of uncertain significance. There is no evidence of pleural effusion or pneumothorax.  No masses are identified; no abnormal focal contrast enhancement is seen.  The mediastinum is unremarkable in  appearance.  No mediastinal lymphadenopathy is seen.  No pericardial effusion is identified. The great vessels are unremarkable in appearance.  No axillary lymphadenopathy is seen.  The visualized portions of the thyroid gland are unremarkable in appearance.  The visualized portions of the liver and spleen are unremarkable. The visualized portions of the pancreas, stomach, adrenal glands and kidneys are within normal limits.  No acute osseous abnormalities are seen.  IMPRESSION:  1.  Prominent pulmonary embolus within pulmonary arteries to all lobes of both lungs.  Greater clot burden noted on the right, with pulmonary embolus in the right main pulmonary artery, extending into segmental and subsegmental branches.  More distal left sided pulmonary emboli seen. 2.  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  Critical Value/emergent results were called by telephone at the time of interpretation on 01/07/2012 at 05:59 a.m. to Dr. Sunnie Nielsen, who verbally acknowledged these results.  Original Report Authenticated By: Tonia Ghent, M.D.    Ct Abdomen Pelvis W Contrast  01/12/2012  *RADIOLOGY REPORT*  Clinical Data: Pulmonary embolus with concern for underlying malignancy  CT ABDOMEN AND PELVIS WITH CONTRAST  Technique:  Multidetector CT imaging of the abdomen and pelvis was performed following the standard protocol during bolus administration of intravenous contrast. Oral contrast was also administered.  Contrast: 80mL OMNIPAQUE IOHEXOL 300 MG/ML  SOLN  Comparison: May 18, 2007  Findings: There is a pleural effusion on the left with left lower lobe consolidation.  There is patchy infiltrate in the right base as well.  There is a 5 mm cyst in the anterior segment right lobe of the liver which is stable.  No other liver lesions are identified. There is no biliary duct  dilatation.  Spleen, pancreas, and right adrenal appear normal.  There is mild left adrenal hypertrophy, a stable finding.  Kidneys bilaterally show no mass or hydronephrosis.  In the pelvis, there is no mass or fluid collection.  Appendix is absent.  There is no bowel obstruction.  No free air or portal venous air. There is stool throughout the colon.  There is no ascites, adenopathy, or abscess in the abdomen or pelvis.  Aorta is nonaneurysmal.  There are no blastic or lytic bone lesions.  IMPRESSION: Left pleural effusion.  Infiltrate in both lung bases, more on the left than on the right.  No neoplastic focus is appreciated in the abdomen or pelvis.  There is no inflammatory focus in the abdomen or pelvis.  There is a large amount of stool in the colon.  A small stable cyst is noted in the right lobe of the liver incidentally.   Original Report Authenticated By: Bretta Bang, M.D.      IMPRESSIONS AND PLAN: A 52 y.o. 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 At this point he has no medical insurance, he is awaiting Medicaid coverage. Patient was discussed with Dr. Darrold Span. He will continue on Coumadin as managed by the [facility] Coumadin clinic. He'll followup with Dr. Melvyn Neth today in the next 4-6 weeks with a repeat CBC differential and PT/INR. The patient was strongly advised to discontinue smoking. We also should consider a length of anticoagulation which would be at least one year.  May also need to reimage his heart to determine whether the atrial thrombus is resolving.     Conni Slipper, PA-C 2/2/201411:25 PM

## 2012-04-03 ENCOUNTER — Telehealth: Payer: Self-pay | Admitting: Oncology

## 2012-04-03 NOTE — Telephone Encounter (Signed)
S/w pt re appt for 3/7.

## 2012-04-10 ENCOUNTER — Telehealth: Payer: Self-pay | Admitting: Pharmacist

## 2012-04-10 NOTE — Telephone Encounter (Signed)
Called patient and let him know that his lab appointment has been changed from 1pm to 1:45pm on 04/12/12. Coumadin clinic is at 2pm Pt stated this time would still work fine for him.

## 2012-04-12 ENCOUNTER — Ambulatory Visit (HOSPITAL_BASED_OUTPATIENT_CLINIC_OR_DEPARTMENT_OTHER): Payer: Self-pay | Admitting: Pharmacist

## 2012-04-12 ENCOUNTER — Other Ambulatory Visit (HOSPITAL_BASED_OUTPATIENT_CLINIC_OR_DEPARTMENT_OTHER): Payer: Self-pay

## 2012-04-12 DIAGNOSIS — I2699 Other pulmonary embolism without acute cor pulmonale: Secondary | ICD-10-CM

## 2012-04-12 LAB — PROTIME-INR: Protime: 34.8 Seconds — ABNORMAL HIGH (ref 10.6–13.4)

## 2012-04-12 NOTE — Progress Notes (Signed)
INR therapeutic today (2.9) on 7mg  daily except 6mg  on MWF. Pt states that this new fixed dose is much easier for him to remember.  No missed doses.  No changes in meds or diet. No complaints.  Will continue current dose, and recheck INR in 3 weeks.

## 2012-04-28 ENCOUNTER — Encounter: Payer: Self-pay | Admitting: Oncology

## 2012-04-28 NOTE — Progress Notes (Signed)
Medical Oncology  Note sent to [facility] financial staff re no primary insurance listed, to see if any assistance possible.  Ila Mcgill, MD

## 2012-04-29 ENCOUNTER — Encounter: Payer: Self-pay | Admitting: Oncology

## 2012-04-29 NOTE — Progress Notes (Signed)
I attempted to call pt this morning and the number on file is not accepting calls at this time.  I also called his emergency contact person thats on file and Im just getting a busy signal.  I will attempt to talk to pt when he arrives for his visit on the 7th regarding financial assistance.

## 2012-05-03 ENCOUNTER — Telehealth: Payer: Self-pay | Admitting: Pharmacist

## 2012-05-03 ENCOUNTER — Other Ambulatory Visit: Payer: Self-pay

## 2012-05-03 ENCOUNTER — Ambulatory Visit: Payer: Self-pay | Admitting: Oncology

## 2012-05-03 ENCOUNTER — Ambulatory Visit: Payer: Self-pay

## 2012-05-03 NOTE — Telephone Encounter (Signed)
Called emergency contact number since Daniel Glover's number states that he isnt accepting calls at that number.  Daniel Glover, his grandmother, states that she does not have a way to contact Daniel Glover.  She hasnt heard from him in a long time.

## 2012-05-07 ENCOUNTER — Telehealth: Payer: Self-pay

## 2012-05-07 NOTE — Telephone Encounter (Signed)
Attempted to contact Mr. Daniel Glover to schedule a follow up appointment with Dr. Darrold Span.  Both phone numbers were not in service. Will Have pharmacist in coumadin clinic direct him to scheduling if he comes for appointment with the coumadin clinic tomorrow at 1515.

## 2012-05-08 ENCOUNTER — Encounter: Payer: Self-pay | Admitting: Physician Assistant

## 2012-05-08 ENCOUNTER — Telehealth: Payer: Self-pay | Admitting: Oncology

## 2012-05-08 ENCOUNTER — Other Ambulatory Visit (HOSPITAL_BASED_OUTPATIENT_CLINIC_OR_DEPARTMENT_OTHER): Payer: Self-pay | Admitting: Lab

## 2012-05-08 ENCOUNTER — Ambulatory Visit (HOSPITAL_BASED_OUTPATIENT_CLINIC_OR_DEPARTMENT_OTHER): Payer: Self-pay | Admitting: Physician Assistant

## 2012-05-08 ENCOUNTER — Ambulatory Visit: Payer: Self-pay | Admitting: Pharmacist

## 2012-05-08 VITALS — BP 108/68 | HR 78 | Temp 97.2°F | Resp 20 | Ht 66.0 in | Wt 181.9 lb

## 2012-05-08 DIAGNOSIS — I2699 Other pulmonary embolism without acute cor pulmonale: Secondary | ICD-10-CM

## 2012-05-08 LAB — CBC WITH DIFFERENTIAL/PLATELET
Basophils Absolute: 0 10*3/uL (ref 0.0–0.1)
Eosinophils Absolute: 0.2 10*3/uL (ref 0.0–0.5)
HCT: 44.8 % (ref 38.4–49.9)
HGB: 15.5 g/dL (ref 13.0–17.1)
LYMPH%: 23.8 % (ref 14.0–49.0)
MCV: 88.5 fL (ref 79.3–98.0)
MONO#: 0.5 10*3/uL (ref 0.1–0.9)
MONO%: 9 % (ref 0.0–14.0)
NEUT#: 3.8 10*3/uL (ref 1.5–6.5)
NEUT%: 63.5 % (ref 39.0–75.0)
Platelets: 226 10*3/uL (ref 140–400)
WBC: 5.9 10*3/uL (ref 4.0–10.3)

## 2012-05-08 LAB — COMPREHENSIVE METABOLIC PANEL (CC13)
ALT: 20 U/L (ref 0–55)
AST: 19 U/L (ref 5–34)
Albumin: 3.4 g/dL — ABNORMAL LOW (ref 3.5–5.0)
Alkaline Phosphatase: 68 U/L (ref 40–150)
Calcium: 9.5 mg/dL (ref 8.4–10.4)
Chloride: 105 mEq/L (ref 98–107)
Creatinine: 1.5 mg/dL — ABNORMAL HIGH (ref 0.7–1.3)
Potassium: 3.6 mEq/L (ref 3.5–5.1)

## 2012-05-08 LAB — PROTIME-INR
INR: 2 (ref 2.00–3.50)
Protime: 24 Seconds — ABNORMAL HIGH (ref 10.6–13.4)

## 2012-05-08 NOTE — Progress Notes (Signed)
INR = 2 on Coumadin 7 mg/day; 6 mg MWF Pt c/o increased pain level since he is taking Coumadin.  He notices sensitive skin when he is working out; primarily when he is using the punching bag.  No bruising. Pt is craving spinach & wants to eat some this week. INR therapeutic.  No change to Coumadin necessary. Repeat INR in 3 weeks. Pt may eat spinach this week; 1 serving.  He does not plan to eat spinach regularly. Ebony Hail, Pharm.D., CPP 05/08/2012@4 :23 PM

## 2012-05-08 NOTE — Patient Instructions (Addendum)
Followup with Dr. Darrold Span in 6 weeks with repeat labs

## 2012-05-08 NOTE — Telephone Encounter (Signed)
Gave pt appt for lab and Md for April 2014

## 2012-05-09 ENCOUNTER — Encounter: Payer: Self-pay | Admitting: Oncology

## 2012-05-09 NOTE — Progress Notes (Signed)
Left msg on pt's vm to return my call.  Want to discuss financial assistance with him.

## 2012-05-12 NOTE — Progress Notes (Signed)
Hematology and Oncology Follow Up Visit  Daniel Glover [id number] December 11, 1960 52 y.o. 05/12/2012 9:59 PM   DIAGNOSIS:   Encounter Diagnoses  Name Primary?  . Acute Bilateral PE (pulmonary embolism)   . Right atrial thrombus   . DVT (deep venous thrombosis), unspecified laterality Yes   This is a 52 year old African American male who found to have bilateral pulmonary embolism. He was found to have an questionable right atrial thrombus and in addition he had a Doppler of his right leg which showed a blood clot as well. He had no obvious pre-existing conditions that would give rise to this. His workup in the hospital was negative including CT scan of the abdomen and chest. A hypercoagulable workup to date has been negative as well.   PAST THERAPY:  As above  Interim History:  Patient returns for followup.  He is been doing well on Coumadin. He voices no specific complaints today.He is currently being followed by the [facility] Coumadin clinic. Since his last visit he continues to tolerate his Coumadin therapy well and has no acute bleeding or bruising. He has been having some difficulty with seasonal allergies. He continues to smoke a few loose cigarettes here and there but has decreased the amount and frequency since his last office visit.    Medications: I have reviewed the patient's current medications.  Allergies: No Known Allergies  Past Medical History, Surgical history, Social history, and Family History were reviewed and updated.  Review of Systems: Constitutional:  Negative for fever, chills, night sweats, anorexia, weight loss, pain. Cardiovascular: negative Respiratory: positive for - shortness of breath Neurological: negative Dermatological: negative ENT: negative Gastrointestinal: negative Genito-Urinary: negative Hematological and Lymphatic: negative Breast: negative Musculoskeletal: negative Remaining ROS negative.  Physical Exam:  Blood pressure  108/68, pulse 78, temperature 97.2 F (36.2 C), temperature source Oral, resp. rate 20, height 5\' 6"  (1.676 m), weight 181 lb 14.4 oz (82.509 kg).  ECOG: 1   HEENT:  Sclerae anicteric, conjunctivae pink.  Oropharynx clear.  No mucositis or candidiasis.  Nodes:  No cervical, supraclavicular, or axillary lymphadenopathy palpated.  No masses, discharge, skin change, or nipple inversion..  Lungs:  Clear to auscultation bilaterally.  No crackles, rhonchi, or wheezes.  Heart:  Regular rate and rhythm, no rubs heard..  Abdomen:  Soft, nontender.  Positive bowel sounds.  No organomegaly or masses palpated.  Musculoskeletal:  No focal spinal tenderness to palpation.  Extremities:  Benign.  No peripheral edema or cyanosis.  Skin:  Benign.  Neuro:  Nonfocal.    Lab Results: Lab Results  Component Value Date   WBC 5.9 05/08/2012   HGB 15.5 05/08/2012   HCT 44.8 05/08/2012   MCV 88.5 05/08/2012   PLT 226 05/08/2012     Chemistry      Component Value Date/Time   NA 140 05/08/2012 1524   NA 136 01/10/2012 0425   K 3.6 05/08/2012 1524   K 4.1 01/10/2012 0425   CL 105 05/08/2012 1524   CL 99 01/10/2012 0425   CO2 26 05/08/2012 1524   CO2 26 01/10/2012 0425   BUN 15.9 05/08/2012 1524   BUN 12 01/10/2012 0425   CREATININE 1.5* 05/08/2012 1524   CREATININE 1.01 01/10/2012 0425      Component Value Date/Time   CALCIUM 9.5 05/08/2012 1524   CALCIUM 8.6 01/10/2012 0425   ALKPHOS 68 05/08/2012 1524   ALKPHOS 71 01/07/2012 0724   AST 19 05/08/2012 1524   AST 20 01/07/2012 0724  ALT 20 05/08/2012 1524   ALT 20 01/07/2012 0724   BILITOT 0.67 05/08/2012 1524   BILITOT 1.2 01/07/2012 0724       Radiological Studies:  Dg Chest 2 View  01/07/2012  *RADIOLOGY REPORT*  Clinical Data: Shortness of breath and left chest pain.  CHEST - 2 VIEW  Comparison: Chest radiograph performed 10/28/2011  Findings: The lungs are well-aerated.  Mild focal left lower lobe opacity is compatible with pneumonia.  There is no  evidence of pleural effusion or pneumothorax.  The heart is normal in size; the mediastinal contour is within normal limits.  No acute osseous abnormalities are seen.  IMPRESSION: Mild left lower lobe pneumonia noted.   Original Report Authenticated By: Tonia Ghent, M.D.    Ct Angio Chest Pe W/cm &/or Wo Cm  01/09/2012  **ADDENDUM** CREATED: 01/09/2012 08:06:18  Upon further review, in images 176 - 186 of series 6 demonstrate a filling defect in the medial aspect of the right atrium that measures approximately 1.8 x 1.1 x 1.3 cm.  This is in the lower third of the right atrium, and appears in close proximity to the septal leaflet of the tricuspid valve.  This appears to be separate from the interatrial septum, and is located just anterior/superior to the ostium of the coronary sinus.  Given the presence of bilateral pulmonary emboli, this is statistically most likely to represent a right atrial thrombus.  This was discussed by phone with Dr. Herbie Baltimore of Cardiology by Dr. Llana Aliment on 01/08/2012 at 08:50 p.m.  **END ADDENDUM** SIGNED BY: Florencia Reasons, M.D.   01/07/2012  *RADIOLOGY REPORT*  Clinical Data: Left-sided pleuritic chest pain and shortness of breath.  CT ANGIOGRAPHY CHEST  Technique:  Multidetector CT imaging of the chest using the standard protocol during bolus administration of intravenous contrast. Multiplanar reconstructed images including MIPs were obtained and reviewed to evaluate the vascular anatomy.  Contrast: OMNIPAQUE IOHEXOL 350 MG/ML SOLN  Comparison: Chest radiograph performed earlier today at 02:35 a.m.  Findings: There is prominent pulmonary embolus within pulmonary arteries to all lobes of both lungs.  There is greater clot burden on the right, with pulmonary embolus noted in the right main pulmonary artery, extending into segmental and subsegmental branches.  More distal left sided pulmonary emboli are seen.  Airspace opacification within the left lingula and left lower  lobe is compatible with pulmonary infarct.  Mild right basilar airspace opacity may also reflect pulmonary infarct, though there is some degree of associated bleb formation, of uncertain significance. There is no evidence of pleural effusion or pneumothorax.  No masses are identified; no abnormal focal contrast enhancement is seen.  The mediastinum is unremarkable in appearance.  No mediastinal lymphadenopathy is seen.  No pericardial effusion is identified. The great vessels are unremarkable in appearance.  No axillary lymphadenopathy is seen.  The visualized portions of the thyroid gland are unremarkable in appearance.  The visualized portions of the liver and spleen are unremarkable. The visualized portions of the pancreas, stomach, adrenal glands and kidneys are within normal limits.  No acute osseous abnormalities are seen.  IMPRESSION:  1.  Prominent pulmonary embolus within pulmonary arteries to all lobes of both lungs.  Greater clot burden noted on the right, with pulmonary embolus in the right main pulmonary artery, extending into segmental and subsegmental branches.  More distal left sided pulmonary emboli seen. 2.  Pulmonary infarcts of the left lingula and left lower lobe; mild right basilar airspace opacity may also reflect pulmonary  infarct, though associated blebs are of uncertain significance.  Critical Value/emergent results were called by telephone at the time of interpretation on 01/07/2012 at 05:59 a.m. to Dr. Sunnie Nielsen, who verbally acknowledged these results.  Original Report Authenticated By: Tonia Ghent, M.D.    Ct Abdomen Pelvis W Contrast  01/12/2012  *RADIOLOGY REPORT*  Clinical Data: Pulmonary embolus with concern for underlying malignancy  CT ABDOMEN AND PELVIS WITH CONTRAST  Technique:  Multidetector CT imaging of the abdomen and pelvis was performed following the standard protocol during bolus administration of intravenous contrast. Oral contrast was also administered.   Contrast: 80mL OMNIPAQUE IOHEXOL 300 MG/ML  SOLN  Comparison: May 18, 2007  Findings: There is a pleural effusion on the left with left lower lobe consolidation.  There is patchy infiltrate in the right base as well.  There is a 5 mm cyst in the anterior segment right lobe of the liver which is stable.  No other liver lesions are identified. There is no biliary duct dilatation.  Spleen, pancreas, and right adrenal appear normal.  There is mild left adrenal hypertrophy, a stable finding.  Kidneys bilaterally show no mass or hydronephrosis.  In the pelvis, there is no mass or fluid collection.  Appendix is absent.  There is no bowel obstruction.  No free air or portal venous air. There is stool throughout the colon.  There is no ascites, adenopathy, or abscess in the abdomen or pelvis.  Aorta is nonaneurysmal.  There are no blastic or lytic bone lesions.  IMPRESSION: Left pleural effusion.  Infiltrate in both lung bases, more on the left than on the right.  No neoplastic focus is appreciated in the abdomen or pelvis.  There is no inflammatory focus in the abdomen or pelvis.  There is a large amount of stool in the colon.  A small stable cyst is noted in the right lobe of the liver incidentally.   Original Report Authenticated By: Bretta Bang, M.D.      IMPRESSIONS AND PLAN: This is a  52 y.o. male with a history of a new onset DVT, embolism on chronic anticoagulation.At this point he has no medical insurance, he is still awaiting Medicaid coverage. Patient was discussed with Dr. Darrold Span. He will continue on Coumadin as managed by the [facility] Coumadin clinic. He'll followup with Dr. Darrold Span in 6 weeks with a repeat CBC differential, stat Bmet and PT/INR. At this visit Dr. Darrold Span will also consider referring the patient back to [facility] cardiology for possible reimaging to reevaluate the atrial thrombus. The patient was again strongly advised to discontinue smoking. His pulmonary embolism,  atrial thrombus and DVTs were diagnosed in November 2013. We anticipate that the length of anticoagulation will be at least one year.     Conni Slipper, PA-C 3/16/20149:59 PM

## 2012-05-30 ENCOUNTER — Ambulatory Visit: Payer: Self-pay

## 2012-05-30 ENCOUNTER — Other Ambulatory Visit: Payer: Self-pay | Admitting: Lab

## 2012-05-30 ENCOUNTER — Telehealth: Payer: Self-pay | Admitting: Pharmacist

## 2012-05-30 NOTE — Telephone Encounter (Signed)
FTKa in Coumadin clinic.  Left VM to call and reschedule.

## 2012-06-19 ENCOUNTER — Ambulatory Visit: Payer: Self-pay | Admitting: Oncology

## 2012-06-19 ENCOUNTER — Other Ambulatory Visit: Payer: Self-pay | Admitting: Lab

## 2012-06-19 ENCOUNTER — Telehealth: Payer: Self-pay

## 2012-06-19 NOTE — Telephone Encounter (Signed)
Spoke with Daniel Glover he is out of town and could not make appointment.  He was going to call tomorrow to reschedule.   Rescheduled appointment to 05-30-12 with Tiana Loft as Dr. Darrold Span does not have availability until mid to late May.

## 2012-06-26 ENCOUNTER — Ambulatory Visit (HOSPITAL_BASED_OUTPATIENT_CLINIC_OR_DEPARTMENT_OTHER): Payer: Self-pay | Admitting: Physician Assistant

## 2012-06-26 ENCOUNTER — Encounter: Payer: Self-pay | Admitting: Physician Assistant

## 2012-06-26 ENCOUNTER — Telehealth: Payer: Self-pay | Admitting: Oncology

## 2012-06-26 ENCOUNTER — Other Ambulatory Visit (HOSPITAL_BASED_OUTPATIENT_CLINIC_OR_DEPARTMENT_OTHER): Payer: Self-pay | Admitting: Lab

## 2012-06-26 VITALS — BP 110/68 | HR 79 | Temp 98.1°F | Resp 20 | Ht 66.0 in | Wt 178.8 lb

## 2012-06-26 DIAGNOSIS — I2699 Other pulmonary embolism without acute cor pulmonale: Secondary | ICD-10-CM

## 2012-06-26 DIAGNOSIS — I5189 Other ill-defined heart diseases: Secondary | ICD-10-CM

## 2012-06-26 DIAGNOSIS — I513 Intracardiac thrombosis, not elsewhere classified: Secondary | ICD-10-CM

## 2012-06-26 DIAGNOSIS — I82401 Acute embolism and thrombosis of unspecified deep veins of right lower extremity: Secondary | ICD-10-CM

## 2012-06-26 DIAGNOSIS — I82409 Acute embolism and thrombosis of unspecified deep veins of unspecified lower extremity: Secondary | ICD-10-CM

## 2012-06-26 LAB — BASIC METABOLIC PANEL (CC13)
BUN: 16.8 mg/dL (ref 7.0–26.0)
CO2: 24 mEq/L (ref 22–29)
Chloride: 104 mEq/L (ref 98–107)
Creatinine: 1.2 mg/dL (ref 0.7–1.3)
Glucose: 129 mg/dl — ABNORMAL HIGH (ref 70–99)
Potassium: 3.6 mEq/L (ref 3.5–5.1)

## 2012-06-26 LAB — CBC WITH DIFFERENTIAL/PLATELET
Basophils Absolute: 0.1 10*3/uL (ref 0.0–0.1)
EOS%: 2.8 % (ref 0.0–7.0)
Eosinophils Absolute: 0.2 10*3/uL (ref 0.0–0.5)
HGB: 14.9 g/dL (ref 13.0–17.1)
LYMPH%: 26.6 % (ref 14.0–49.0)
MCH: 29.9 pg (ref 27.2–33.4)
MCV: 88.4 fL (ref 79.3–98.0)
MONO%: 7.5 % (ref 0.0–14.0)
NEUT%: 62.3 % (ref 39.0–75.0)
Platelets: 213 10*3/uL (ref 140–400)
RDW: 13.6 % (ref 11.0–14.6)

## 2012-06-26 LAB — PROTIME-INR: INR: 1.5 — ABNORMAL LOW (ref 2.00–3.50)

## 2012-06-26 NOTE — Patient Instructions (Addendum)
Stop taking these 7.5 mg Coumadin tablets Take 25 mg Coumadin tablets tonight, then resume taking your Coumadin as previously prescribed: 6 mg by mouth on Mondays Wednesdays and Fridays and 7 mg by mouth on Tuesdays Thursdays and Saturdays and Sundays  Followup in 2 weeks with a repeat PT/INR and visit with the Coumadin clinic Followup with Dr. Darrold Span in one month with a repeat PT/INR and Coumadin clinic visit

## 2012-06-26 NOTE — Telephone Encounter (Signed)
gve this pt may 2014 appt calendar.

## 2012-06-27 NOTE — Progress Notes (Signed)
Hematology and Oncology Follow Up Visit  Daniel Glover [id number] [birth year] 52 y.o. 06/26/2012  DIAGNOSIS:   Encounter Diagnoses  Name Primary?  . Acute Bilateral PE (pulmonary embolism) Yes  . DVT (deep venous thrombosis), right   . Right atrial thrombus    This is a 52 year old African American male who found to have bilateral pulmonary embolism. He was found to have an questionable right atrial thrombus and in addition he had a Doppler of his right leg which showed a blood clot as well. He had no obvious pre-existing conditions that would give rise to this. His workup in the hospital was negative including CT scan of the abdomen and chest. A hypercoagulable workup to date has been negative as well.   PAST THERAPY:  As above  Interim History:  Patient returns for followup.  He reports that he ran out of his 1 mg Coumadin tablets. He states that he had a bottle of 7.5 mg Coumadin tablets and he is been taking those alternating with 5 mg Coumadin tablets. He continues to have issues with his insurance and has had problems getting his Medicaid applied for and approved. He denied any problems with bleeding or bruising. He continues to smoke a few cigarettes. He has missed a few appointments with Dr. Darrold Span due in part to being out of town but also his insurance status.  He voices no specific complaints today.He is currently being followed by the [facility] Coumadin clinic. Since his last visit he continues to tolerate his Coumadin therapy well and has no acute bleeding or bruising. He does report some occasional shortness of breath with exertion.  Medications: I have reviewed the patient's current medications.  Allergies: No Known Allergies  Past Medical History, Surgical history, Social history, and Family History were reviewed and updated.  Review of Systems: Constitutional:  Negative for fever, chills, night sweats, anorexia, weight loss, pain. Cardiovascular:  negative Respiratory: positive for - shortness of breath Neurological: negative Dermatological: negative ENT: negative Gastrointestinal: negative Genito-Urinary: negative Hematological and Lymphatic: negative Breast: negative Musculoskeletal: negative Remaining ROS negative.  Physical Exam:  Blood pressure 110/68, pulse 79, temperature 98.1 F (36.7 C), temperature source Oral, resp. rate 20, height 5\' 6"  (1.676 m), weight 178 lb 12.8 oz (81.103 kg).  ECOG: 1   HEENT:  Sclerae anicteric, conjunctivae pink.  Oropharynx clear.  No mucositis or candidiasis.  Nodes:  No cervical, supraclavicular, or axillary lymphadenopathy palpated.  No masses, discharge, skin change, or nipple inversion..  Lungs:  Clear to auscultation bilaterally.  No crackles, rhonchi, or wheezes.  Heart:  Regular rate and rhythm, no rubs heard..  Abdomen:  Soft, nontender.  Positive bowel sounds.  No organomegaly or masses palpated.  Musculoskeletal:  No focal spinal tenderness to palpation.  Extremities:  Benign.  No peripheral edema or cyanosis.  Skin:  Benign.  Neuro:  Nonfocal.    Lab Results: Lab Results  Component Value Date   WBC 7.4 06/26/2012   HGB 14.9 06/26/2012   HCT 44.1 06/26/2012   MCV 88.4 06/26/2012   PLT 213 06/26/2012     Chemistry      Component Value Date/Time   NA 138 06/26/2012 1410   NA 136 01/10/2012 0425   K 3.6 06/26/2012 1410   K 4.1 01/10/2012 0425   CL 104 06/26/2012 1410   CL 99 01/10/2012 0425   CO2 24 06/26/2012 1410   CO2 26 01/10/2012 0425   BUN 16.8 06/26/2012 1410   BUN 12  01/10/2012 0425   CREATININE 1.2 06/26/2012 1410   CREATININE 1.01 01/10/2012 0425      Component Value Date/Time   CALCIUM 8.8 06/26/2012 1410   CALCIUM 8.6 01/10/2012 0425   ALKPHOS 68 05/08/2012 1524   ALKPHOS 71 01/07/2012 0724   AST 19 05/08/2012 1524   AST 20 01/07/2012 0724   ALT 20 05/08/2012 1524   ALT 20 01/07/2012 0724   BILITOT 0.67 05/08/2012 1524   BILITOT 1.2 01/07/2012 0724        Radiological Studies:  Dg Chest 2 View  01/07/2012  *RADIOLOGY REPORT*  Clinical Data: Shortness of breath and left chest pain.  CHEST - 2 VIEW  Comparison: Chest radiograph performed 10/28/2011  Findings: The lungs are well-aerated.  Mild focal left lower lobe opacity is compatible with pneumonia.  There is no evidence of pleural effusion or pneumothorax.  The heart is normal in size; the mediastinal contour is within normal limits.  No acute osseous abnormalities are seen.  IMPRESSION: Mild left lower lobe pneumonia noted.   Original Report Authenticated By: Tonia Ghent, M.D.    Ct Angio Chest Pe W/cm &/or Wo Cm  01/09/2012  **ADDENDUM** CREATED: 01/09/2012 08:06:18  Upon further review, in images 176 - 186 of series 6 demonstrate a filling defect in the medial aspect of the right atrium that measures approximately 1.8 x 1.1 x 1.3 cm.  This is in the lower third of the right atrium, and appears in close proximity to the septal leaflet of the tricuspid valve.  This appears to be separate from the interatrial septum, and is located just anterior/superior to the ostium of the coronary sinus.  Given the presence of bilateral pulmonary emboli, this is statistically most likely to represent a right atrial thrombus.  This was discussed by phone with Dr. Herbie Baltimore of Cardiology by Dr. Llana Aliment on 01/08/2012 at 08:50 p.m.  **END ADDENDUM** SIGNED BY: Florencia Reasons, M.D.   01/07/2012  *RADIOLOGY REPORT*  Clinical Data: Left-sided pleuritic chest pain and shortness of breath.  CT ANGIOGRAPHY CHEST  Technique:  Multidetector CT imaging of the chest using the standard protocol during bolus administration of intravenous contrast. Multiplanar reconstructed images including MIPs were obtained and reviewed to evaluate the vascular anatomy.  Contrast: OMNIPAQUE IOHEXOL 350 MG/ML SOLN  Comparison: Chest radiograph performed earlier today at 02:35 a.m.  Findings: There is prominent pulmonary embolus  within pulmonary arteries to all lobes of both lungs.  There is greater clot burden on the right, with pulmonary embolus noted in the right main pulmonary artery, extending into segmental and subsegmental branches.  More distal left sided pulmonary emboli are seen.  Airspace opacification within the left lingula and left lower lobe is compatible with pulmonary infarct.  Mild right basilar airspace opacity may also reflect pulmonary infarct, though there is some degree of associated bleb formation, of uncertain significance. There is no evidence of pleural effusion or pneumothorax.  No masses are identified; no abnormal focal contrast enhancement is seen.  The mediastinum is unremarkable in appearance.  No mediastinal lymphadenopathy is seen.  No pericardial effusion is identified. The great vessels are unremarkable in appearance.  No axillary lymphadenopathy is seen.  The visualized portions of the thyroid gland are unremarkable in appearance.  The visualized portions of the liver and spleen are unremarkable. The visualized portions of the pancreas, stomach, adrenal glands and kidneys are within normal limits.  No acute osseous abnormalities are seen.  IMPRESSION:  1.  Prominent pulmonary embolus within  pulmonary arteries to all lobes of both lungs.  Greater clot burden noted on the right, with pulmonary embolus in the right main pulmonary artery, extending into segmental and subsegmental branches.  More distal left sided pulmonary emboli seen. 2.  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  Critical Value/emergent results were called by telephone at the time of interpretation on 01/07/2012 at 05:59 a.m. to Dr. Sunnie Nielsen, who verbally acknowledged these results.  Original Report Authenticated By: Tonia Ghent, M.D.    Ct Abdomen Pelvis W Contrast  01/12/2012   *RADIOLOGY REPORT*  Clinical Data: Pulmonary embolus with concern for underlying malignancy  CT ABDOMEN AND PELVIS WITH CONTRAST  Technique:  Multidetector CT imaging of the abdomen and pelvis was performed following the standard protocol during bolus administration of intravenous contrast. Oral contrast was also administered.  Contrast: 80mL OMNIPAQUE IOHEXOL 300 MG/ML  SOLN  Comparison: May 18, 2007  Findings: There is a pleural effusion on the left with left lower lobe consolidation.  There is patchy infiltrate in the right base as well.  There is a 5 mm cyst in the anterior segment right lobe of the liver which is stable.  No other liver lesions are identified. There is no biliary duct dilatation.  Spleen, pancreas, and right adrenal appear normal.  There is mild left adrenal hypertrophy, a stable finding.  Kidneys bilaterally show no mass or hydronephrosis.  In the pelvis, there is no mass or fluid collection.  Appendix is absent.  There is no bowel obstruction.  No free air or portal venous air. There is stool throughout the colon.  There is no ascites, adenopathy, or abscess in the abdomen or pelvis.  Aorta is nonaneurysmal.  There are no blastic or lytic bone lesions.  IMPRESSION: Left pleural effusion.  Infiltrate in both lung bases, more on the left than on the right.  No neoplastic focus is appreciated in the abdomen or pelvis.  There is no inflammatory focus in the abdomen or pelvis.  There is a large amount of stool in the colon.  A small stable cyst is noted in the right lobe of the liver incidentally.   Original Report Authenticated By: Bretta Bang, M.D.      IMPRESSIONS AND PLAN: This is a  52 y.o. male with a history of a right lower external DVT and bilateral pulmonary embolism on chronic anticoagulation.At this point he has no medical insurance, he is still awaiting Medicaid coverage. Patient was discussed with Dr. Darrold Span. He will continue on Coumadin as managed by the [facility] Coumadin clinic. His INR is subtherapeutic today at 1.5. He was instructed to take 10 mg of Coumadin today and then resume his previous schedule of taking Coumadin at 6 mg on Monday Wednesdays and Fridays and 7 mg on Tuesdays Thursdays Saturdays and Sundays. He was instructed to stop taking 7.5 mg Coumadin tablets totally. He was given Coumadin samples of 1 mg tablets and 5 mg tablets. We'll have him return in 2 weeks for a another symptom management and repeat PT/INR. He will also followup with Dr. Darrold Span in 4 weeks with a repeat PT/INR and Coumadin clinic appointment at that visit as well.  At this visit Dr. Darrold Span will also consider referring the patient back to [facility] cardiology for possible reimaging to reevaluate the atrial thrombus. The patient was again strongly advised to discontinue  smoking. His pulmonary embolism, atrial thrombus and DVTs were diagnosed in November 2013. We anticipate that the length of anticoagulation will be at least one year.     Laural Benes, Emrys Mceachron E, PA-C

## 2012-07-09 ENCOUNTER — Other Ambulatory Visit: Payer: Self-pay | Admitting: Physician Assistant

## 2012-07-09 DIAGNOSIS — I2699 Other pulmonary embolism without acute cor pulmonale: Secondary | ICD-10-CM

## 2012-07-09 DIAGNOSIS — I513 Intracardiac thrombosis, not elsewhere classified: Secondary | ICD-10-CM

## 2012-07-09 DIAGNOSIS — I82401 Acute embolism and thrombosis of unspecified deep veins of right lower extremity: Secondary | ICD-10-CM

## 2012-07-10 ENCOUNTER — Ambulatory Visit: Payer: Self-pay

## 2012-07-10 ENCOUNTER — Ambulatory Visit (HOSPITAL_BASED_OUTPATIENT_CLINIC_OR_DEPARTMENT_OTHER): Payer: Self-pay | Admitting: Physician Assistant

## 2012-07-10 ENCOUNTER — Ambulatory Visit: Payer: Self-pay | Admitting: Pharmacist

## 2012-07-10 ENCOUNTER — Encounter: Payer: Self-pay | Admitting: Oncology

## 2012-07-10 ENCOUNTER — Other Ambulatory Visit (HOSPITAL_BASED_OUTPATIENT_CLINIC_OR_DEPARTMENT_OTHER): Payer: Self-pay | Admitting: Lab

## 2012-07-10 ENCOUNTER — Telehealth: Payer: Self-pay | Admitting: Oncology

## 2012-07-10 VITALS — BP 129/80 | HR 65 | Temp 97.5°F | Resp 18 | Ht 66.0 in | Wt 181.1 lb

## 2012-07-10 DIAGNOSIS — I82401 Acute embolism and thrombosis of unspecified deep veins of right lower extremity: Secondary | ICD-10-CM

## 2012-07-10 DIAGNOSIS — I2699 Other pulmonary embolism without acute cor pulmonale: Secondary | ICD-10-CM

## 2012-07-10 DIAGNOSIS — Z7901 Long term (current) use of anticoagulants: Secondary | ICD-10-CM

## 2012-07-10 DIAGNOSIS — I513 Intracardiac thrombosis, not elsewhere classified: Secondary | ICD-10-CM

## 2012-07-10 DIAGNOSIS — I82409 Acute embolism and thrombosis of unspecified deep veins of unspecified lower extremity: Secondary | ICD-10-CM

## 2012-07-10 DIAGNOSIS — F17200 Nicotine dependence, unspecified, uncomplicated: Secondary | ICD-10-CM

## 2012-07-10 LAB — CBC WITH DIFFERENTIAL/PLATELET
BASO%: 0.3 % (ref 0.0–2.0)
Eosinophils Absolute: 0.2 10*3/uL (ref 0.0–0.5)
HCT: 43.9 % (ref 38.4–49.9)
HGB: 15.1 g/dL (ref 13.0–17.1)
MCHC: 34.3 g/dL (ref 32.0–36.0)
MONO#: 0.8 10*3/uL (ref 0.1–0.9)
NEUT#: 6 10*3/uL (ref 1.5–6.5)
NEUT%: 65.1 % (ref 39.0–75.0)
WBC: 9.2 10*3/uL (ref 4.0–10.3)
lymph#: 2.1 10*3/uL (ref 0.9–3.3)

## 2012-07-10 LAB — COMPREHENSIVE METABOLIC PANEL (CC13)
AST: 20 U/L (ref 5–34)
Albumin: 3.6 g/dL (ref 3.5–5.0)
BUN: 11 mg/dL (ref 7.0–26.0)
CO2: 26 mEq/L (ref 22–29)
Calcium: 8.8 mg/dL (ref 8.4–10.4)
Creatinine: 1.1 mg/dL (ref 0.7–1.3)
Sodium: 136 mEq/L (ref 136–145)

## 2012-07-10 LAB — PROTIME-INR

## 2012-07-10 NOTE — Progress Notes (Signed)
INR within goal today. No problems to report regarding anticoagulation. No changes in diet, medications, etc. No missed doses. Continue Coumadin 7mg  daily except 6mg  on MWF. Recheck INR in 2 weeks on 07/24/12 at 11am for lab, 11:30am for Dr. Darrold Span and 12:30 pm for Coumadin clinic  Samples provided: Coumadin 1mg  tablets (x 29 tablets)                                 [id number], Exp 03/2013                                Coumadin 5mg  tablets (x 30 tablets)                                 [id number], Exp 06/2013

## 2012-07-10 NOTE — Patient Instructions (Signed)
Continue Coumadin 7mg  daily except 6mg  on MWF. Recheck INR in 2 weeks on 07/24/12 at 11am for lab, 11:30am for Dr. Darrold Span and 12:30 pm for Coumadin clinic

## 2012-07-10 NOTE — Telephone Encounter (Signed)
gv and printed appt sched and avs for pt...sched pt with Dr. Elease Hashimoto cardiologist for 6.4.14 @8 :45am..Marland Kitchen

## 2012-07-10 NOTE — Patient Instructions (Addendum)
You are being referred to a cardiologist to re-evaluate your atrial thrombus Follow up with Dr. Darrold Span on 07/24/12 as scheduled Continue Coumadin Clinic appointments as scheduled

## 2012-07-10 NOTE — Progress Notes (Signed)
Checked in new pt with no insurance and no income at this time.  I discussed applying for Medicaid and the financial assistance program.  I gave him a Medicaid application and an EPP application.

## 2012-07-12 ENCOUNTER — Telehealth: Payer: Self-pay | Admitting: Oncology

## 2012-07-12 NOTE — Progress Notes (Signed)
Hematology and Oncology Follow Up Visit  Daniel Glover [id number] [birth year] 52 y.o.  07/10/2012  DIAGNOSIS:   Encounter Diagnoses  Name Primary?  . Acute Bilateral PE (pulmonary embolism) Yes  . DVT (deep venous thrombosis), right   . Right atrial thrombus    This is a 52 year old African American male who found to have bilateral pulmonary embolism. He was found to have an questionable right atrial thrombus and in addition he had a Doppler of his right leg which showed a blood clot as well. He had no obvious pre-existing conditions that would give rise to this. His workup in the hospital was negative including CT scan of the abdomen and chest. A hypercoagulable workup to date has been negative as well.   PAST THERAPY:  As above  Interim History:  Patient returns for followup. He reports that he is been compliant with his Coumadin. His current dose is 6 mg on Mondays Wednesdays and Fridays and 7 mg all other days. [facility]. He continues to have issues with his Medicaid and reports that he was able to meet with one of our financial advocates today to help him with his application and answers questions. He again has missed his appointment with Dr. Darrold Span and presents today for a symptom management visit.  He denied any problems with bleeding or bruising. He continues to smoke a few cigarettes. He voices no specific complaints today. Since his last visit he continues to tolerate his Coumadin therapy well and has no acute bleeding or bruising. He continues to report some occasional shortness of breath with exertion.  Medications: I have reviewed the patient's current medications.  Allergies: No Known Allergies  Past Medical History, Surgical history, Social history, and Family History were reviewed and updated.  Review of Systems: Constitutional:  Negative for fever, chills, night sweats, anorexia, weight loss,  pain. Cardiovascular: negative Respiratory: positive for - shortness of breath Neurological: negative Dermatological: negative ENT: negative Gastrointestinal: negative Genito-Urinary: negative Hematological and Lymphatic: negative Breast: negative Musculoskeletal: negative Remaining ROS negative.  Physical Exam:  Blood pressure 129/80, pulse 65, temperature 97.5 F (36.4 C), temperature source Oral, resp. rate 18, height 5\' 6"  (1.676 m), weight 181 lb 1.6 oz (82.146 kg).  ECOG: 1   HEENT:  Sclerae anicteric, conjunctivae pink.  Oropharynx clear.  No mucositis or candidiasis.  Nodes:  No cervical, supraclavicular, or axillary lymphadenopathy palpated.  No masses, discharge, skin change, or nipple inversion..  Lungs:  Clear to auscultation bilaterally.  No crackles, rhonchi, or wheezes.  Heart:  Regular rate and rhythm, no rubs heard..  Abdomen:  Soft, nontender.  Positive bowel sounds.  No organomegaly or masses palpated.  Musculoskeletal:  No focal spinal tenderness to palpation.  Extremities:  Benign.  No peripheral edema or cyanosis.  Skin:  Benign.  Neuro:  Nonfocal.    Lab Results: Lab Results  Component Value Date   WBC 9.2 07/10/2012   HGB 15.1 07/10/2012   HCT 43.9 07/10/2012   MCV 88.7 07/10/2012   PLT 209 07/10/2012     Chemistry      Component Value Date/Time   NA 136 07/10/2012 1255   NA 136 01/10/2012 0425   K 3.7 07/10/2012 1255   K 4.1 01/10/2012 0425   CL 103 07/10/2012 1255   CL 99 01/10/2012 0425   CO2 26 07/10/2012 1255   CO2 26 01/10/2012 0425   BUN 11.0 07/10/2012 1255   BUN 12 01/10/2012  0425   CREATININE 1.1 07/10/2012 1255   CREATININE 1.01 01/10/2012 0425      Component Value Date/Time   CALCIUM 8.8 07/10/2012 1255   CALCIUM 8.6 01/10/2012 0425   ALKPHOS 75 07/10/2012 1255   ALKPHOS 71 01/07/2012 0724   AST 20 07/10/2012 1255   AST 20 01/07/2012 0724   ALT 16 07/10/2012 1255   ALT 20 01/07/2012 0724   BILITOT 0.99 07/10/2012 1255   BILITOT 1.2  01/07/2012 0724       Radiological Studies:  Dg Chest 2 View  01/07/2012  *RADIOLOGY REPORT*  Clinical Data: Shortness of breath and left chest pain.  CHEST - 2 VIEW  Comparison: Chest radiograph performed 10/28/2011  Findings: The lungs are well-aerated.  Mild focal left lower lobe opacity is compatible with pneumonia.  There is no evidence of pleural effusion or pneumothorax.  The heart is normal in size; the mediastinal contour is within normal limits.  No acute osseous abnormalities are seen.  IMPRESSION: Mild left lower lobe pneumonia noted.   Original Report Authenticated By: Tonia Ghent, M.D.    Ct Angio Chest Pe W/cm &/or Wo Cm  01/09/2012  **ADDENDUM** CREATED: 01/09/2012 08:06:18  Upon further review, in images 176 - 186 of series 6 demonstrate a filling defect in the medial aspect of the right atrium that measures approximately 1.8 x 1.1 x 1.3 cm.  This is in the lower third of the right atrium, and appears in close proximity to the septal leaflet of the tricuspid valve.  This appears to be separate from the interatrial septum, and is located just anterior/superior to the ostium of the coronary sinus.  Given the presence of bilateral pulmonary emboli, this is statistically most likely to represent a right atrial thrombus.  This was discussed by phone with Dr. Herbie Baltimore of Cardiology by Dr. Llana Aliment on 01/08/2012 at 08:50 p.m.  **END ADDENDUM** SIGNED BY: Florencia Reasons, M.D.   01/07/2012  *RADIOLOGY REPORT*  Clinical Data: Left-sided pleuritic chest pain and shortness of breath.  CT ANGIOGRAPHY CHEST  Technique:  Multidetector CT imaging of the chest using the standard protocol during bolus administration of intravenous contrast. Multiplanar reconstructed images including MIPs were obtained and reviewed to evaluate the vascular anatomy.  Contrast: OMNIPAQUE IOHEXOL 350 MG/ML SOLN  Comparison: Chest radiograph performed earlier today at 02:35 a.m.  Findings: There is prominent  pulmonary embolus within pulmonary arteries to all lobes of both lungs.  There is greater clot burden on the right, with pulmonary embolus noted in the right main pulmonary artery, extending into segmental and subsegmental branches.  More distal left sided pulmonary emboli are seen.  Airspace opacification within the left lingula and left lower lobe is compatible with pulmonary infarct.  Mild right basilar airspace opacity may also reflect pulmonary infarct, though there is some degree of associated bleb formation, of uncertain significance. There is no evidence of pleural effusion or pneumothorax.  No masses are identified; no abnormal focal contrast enhancement is seen.  The mediastinum is unremarkable in appearance.  No mediastinal lymphadenopathy is seen.  No pericardial effusion is identified. The great vessels are unremarkable in appearance.  No axillary lymphadenopathy is seen.  The visualized portions of the thyroid gland are unremarkable in appearance.  The visualized portions of the liver and spleen are unremarkable. The visualized portions of the pancreas, stomach, adrenal glands and kidneys are within normal limits.  No acute osseous abnormalities are seen.  IMPRESSION:  1.  Prominent pulmonary embolus within pulmonary  arteries to all lobes of both lungs.  Greater clot burden noted on the right, with pulmonary embolus in the right main pulmonary artery, extending into segmental and subsegmental branches.  More distal left sided pulmonary emboli seen. 2.  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  Critical Value/emergent results were called by telephone at the time of interpretation on 01/07/2012 at 05:59 a.m. to Dr. Sunnie Nielsen, who verbally acknowledged these results.  Original Report Authenticated By: Tonia Ghent, M.D.    Ct Abdomen Pelvis W  Contrast  01/12/2012  *RADIOLOGY REPORT*  Clinical Data: Pulmonary embolus with concern for underlying malignancy  CT ABDOMEN AND PELVIS WITH CONTRAST  Technique:  Multidetector CT imaging of the abdomen and pelvis was performed following the standard protocol during bolus administration of intravenous contrast. Oral contrast was also administered.  Contrast: 80mL OMNIPAQUE IOHEXOL 300 MG/ML  SOLN  Comparison: May 18, 2007  Findings: There is a pleural effusion on the left with left lower lobe consolidation.  There is patchy infiltrate in the right base as well.  There is a 5 mm cyst in the anterior segment right lobe of the liver which is stable.  No other liver lesions are identified. There is no biliary duct dilatation.  Spleen, pancreas, and right adrenal appear normal.  There is mild left adrenal hypertrophy, a stable finding.  Kidneys bilaterally show no mass or hydronephrosis.  In the pelvis, there is no mass or fluid collection.  Appendix is absent.  There is no bowel obstruction.  No free air or portal venous air. There is stool throughout the colon.  There is no ascites, adenopathy, or abscess in the abdomen or pelvis.  Aorta is nonaneurysmal.  There are no blastic or lytic bone lesions.  IMPRESSION: Left pleural effusion.  Infiltrate in both lung bases, more on the left than on the right.  No neoplastic focus is appreciated in the abdomen or pelvis.  There is no inflammatory focus in the abdomen or pelvis.  There is a large amount of stool in the colon.  A small stable cyst is noted in the right lobe of the liver incidentally.   Original Report Authenticated By: Bretta Bang, M.D.      IMPRESSIONS AND PLAN: This is a  52 y.o. male with a history of a right lower external DVT and bilateral pulmonary embolism on chronic anticoagulation.At this point he has no medical insurance, he is still awaiting Medicaid coverage. Patient was discussed with Dr. Darrold Span. He will continue on Coumadin as managed  by the [facility]. His INR is therapeutic today at 2.2.  He will also followup with Dr. Darrold Span in 2 weeks with a repeat PT/INR and Coumadin clinic appointment at that visit as well. We will go ahead and refer him back to cardiology for reevaluation of persistence versus resolution of the right atrial thrombus.  The patient was again strongly advised to discontinue smoking. His pulmonary embolism, atrial thrombus and DVTs were diagnosed in November 2013. We anticipate that the length of anticoagulation will be at least one year.     Laural Benes, Shawnie Nicole E, PA-C

## 2012-07-12 NOTE — Telephone Encounter (Signed)
lvm for pt that per AJ cancel 6.4.14 app with Dr. Elease Hashimoto and it has been r/s with Dr. Armanda Magic on 6.5.14 @ 11:00am..Marland Kitchen

## 2012-07-22 ENCOUNTER — Other Ambulatory Visit: Payer: Self-pay | Admitting: Oncology

## 2012-07-24 ENCOUNTER — Other Ambulatory Visit (HOSPITAL_BASED_OUTPATIENT_CLINIC_OR_DEPARTMENT_OTHER): Payer: Self-pay | Admitting: Lab

## 2012-07-24 ENCOUNTER — Ambulatory Visit: Payer: Self-pay | Admitting: Pharmacist

## 2012-07-24 ENCOUNTER — Ambulatory Visit: Payer: Self-pay

## 2012-07-24 ENCOUNTER — Ambulatory Visit: Payer: Self-pay | Admitting: Oncology

## 2012-07-24 ENCOUNTER — Encounter: Payer: Self-pay | Admitting: Oncology

## 2012-07-24 ENCOUNTER — Telehealth: Payer: Self-pay | Admitting: Oncology

## 2012-07-24 ENCOUNTER — Ambulatory Visit (HOSPITAL_BASED_OUTPATIENT_CLINIC_OR_DEPARTMENT_OTHER): Payer: Self-pay | Admitting: Oncology

## 2012-07-24 VITALS — BP 110/78 | HR 63 | Temp 98.0°F | Resp 18 | Ht 66.0 in | Wt 180.6 lb

## 2012-07-24 DIAGNOSIS — I82409 Acute embolism and thrombosis of unspecified deep veins of unspecified lower extremity: Secondary | ICD-10-CM

## 2012-07-24 DIAGNOSIS — I2699 Other pulmonary embolism without acute cor pulmonale: Secondary | ICD-10-CM

## 2012-07-24 DIAGNOSIS — F17200 Nicotine dependence, unspecified, uncomplicated: Secondary | ICD-10-CM

## 2012-07-24 LAB — PROTIME-INR
INR: 2.6 (ref 2.00–3.50)
Protime: 31.2 Seconds — ABNORMAL HIGH (ref 10.6–13.4)

## 2012-07-24 NOTE — Telephone Encounter (Signed)
gv and printed appt sched and avs for pt  °

## 2012-07-24 NOTE — Progress Notes (Signed)
OFFICE PROGRESS NOTE   07/24/2012   Physicians: Armanda Magic, [facility].  No PCP.  INTERVAL HISTORY:  Patient is seen, alone for visit, in follow up of anticoagulation for bilateral pulmonary emboli which were diagnosed in Nov 2013, etiology not clear. His initial evaluatin and care was by Dr Pierce Crane.  He is followed by [facility] coumadin clinic between provider visits. He continues to smoke cigarettes, which we have discussed today. He is to see Dr Armanda Magic on 08-11-12, as echocardiogram 01-08-2012 with diagnosis of PEs had question of atrial thrombus.  Per EMR, no insurance as yet.  Patient presented to ED in Nov 2013 with atypical left lower chest pain x at least several weeks, which had been treated thru urgent care centers as pneumonia. He had acute RLE DVT by dopplers 01-07-12 and "prominent pulmonary embolus within pulmonary arteries to all lobes of both lungs" by CT angio chest 01-08-13; there was question on that CT of filling defect in right atrium, with echocardiogram 01-08-12 showing mobile density in RA. He was hospitalized 11-10 to 01-14-12, on heparin which was converted to coumadin. He has mostly been in therapeutic range by outpatient follow up, with no bleeding. Hypercoagulable workup in hospital was not remarkable, with labs done day of admission 01-06-13 including protein C 102, Protein S normal, B2 glycoprotein/ AT III/cardiolipin all WNL. He also had negative ANA, normal Ca 19-9/CEA/PSA.  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 Note long tobacco as above. He has sister out of state with lupus who is also on coumadin, tho he does not know reason for the anticoagulation He has not had colonoscopy (see insurance information above).  No chest  pain, hemoptysis or noted shortness of breath now. No swelling LE. No bleeding. Good energy, good appetite, no pain, no fever, no change in bowels. He would like to resume exercising at gym, which seems reasonable. Remainder of 10 point Review of Systems negative.  Objective:  Vital signs in last 24 hours:  BP 110/78  Pulse 63  Temp(Src) 98 F (36.7 C) (Oral)  Resp 18  Ht 5\' 6"  (1.676 m)  Wt 180 lb 9.6 oz (81.92 kg)  BMI 29.16 kg/m2  SpO2 96%  Weight is stable. Healthy appearing man looks younger than stated age, easily ambulatory, good historian, respirations not labored.  HEENT:PERRLA, sclera clear, anicteric, oropharynx clear, no lesions and neck supple with midline trachea LymphaticsCervical, supraclavicular, and axillary nodes normal. Resp: clear to auscultation bilaterally and normal percussion bilaterally Cardio: regular rate and rhythm GI: soft, non-tender; bowel sounds normal; no masses,  no organomegaly Extremities: extremities normal, atraumatic, no cyanosis or edema Neuro:nonfocal Skin without rash or ecchymosis  Lab Results:  Results for orders placed in visit on 07/24/12  PROTIME-INR      Result Value Range   Protime 31.2 (*) 10.6 - 13.4 Seconds   INR 2.60  2.00 - 3.50   Lovenox No      CBC 07-10-12 entirely normal  Studies/Results:  No results found.  Medications: I have reviewed the patient's current medications.  We have discussed tobacco cessation, and he is interested in this.  Assessment/Plan: 1.RLE DVT with extensive bilateral PEs in Nov 2013: unclear family history in sister and ongoing cigarettes, otherwise nothing obvious. He understands that PEs were  very significant and could have been fatal. He is tolerating coumadin well and understands that safest course may be to continue this indefinitely. He will continue close follow up by coumadin clinic, and should be seen back by provider in ~ 6 mo or sooner if concerns. For follow up echocardiogram  later this month. 2.ongoing tobacco as above 3.will need colonoscopy at least at age 56    Glover,Daniel Demetrius Charity, MD   07/24/2012, 12:17 PM

## 2012-07-24 NOTE — Progress Notes (Signed)
INR = 2.6 Pt took 6 mg daily; 7 mg MWF We had on record that he should be taking 7 mg/day; 6 mg MWF. He got confused back in April when he had a dose adjustment by Tiana Loft, PA & he has taken the current dose since that time. INR therapeutic. Cont same dose. Repeat protime in 3 weeks. Ebony Hail, Pharm.D., CPP 07/24/2012@12 :40 PM  .

## 2012-07-31 ENCOUNTER — Institutional Professional Consult (permissible substitution): Payer: Self-pay | Admitting: Cardiovascular Disease

## 2012-08-12 ENCOUNTER — Ambulatory Visit: Payer: Self-pay

## 2012-08-12 ENCOUNTER — Telehealth: Payer: Self-pay | Admitting: Pharmacist

## 2012-08-12 ENCOUNTER — Other Ambulatory Visit: Payer: Self-pay | Admitting: Lab

## 2012-08-12 NOTE — Telephone Encounter (Signed)
Called patient because he missed coumadin appointment this afternoon. Could not leave VM as patient's phone number stated "this number is not accepting calls or messages at this time" will try to reschedule.

## 2012-08-22 ENCOUNTER — Telehealth: Payer: Self-pay | Admitting: Pharmacist

## 2012-09-05 ENCOUNTER — Ambulatory Visit (HOSPITAL_BASED_OUTPATIENT_CLINIC_OR_DEPARTMENT_OTHER): Payer: Self-pay | Admitting: Pharmacist

## 2012-09-05 ENCOUNTER — Other Ambulatory Visit: Payer: Self-pay | Admitting: Lab

## 2012-09-05 DIAGNOSIS — I2699 Other pulmonary embolism without acute cor pulmonale: Secondary | ICD-10-CM

## 2012-09-05 LAB — PROTIME-INR: INR: 2.5 (ref 2.00–3.50)

## 2012-09-05 NOTE — Patient Instructions (Addendum)
Continue Coumadin 6mg  daily except 7 mg on MWF. Recheck INR on 10/02/12 at 2pm for lab and 2:15pm for Coumadin clinic. Keep [place] posted on your types of fasting that you are doing. It may effect your INR/Coumadin levels. Call us if you notice any excessive/unusual bruising or bleeding or develop other concerns.

## 2012-09-05 NOTE — Progress Notes (Signed)
INR within goal today. No problems to report regarding anticoagulation. Pt may be fasting (no food) for ~ 2&1/2 days on 7/17/ - 09/15/12. His last meal will be between 4-6pm on 7/17 and then he will resume his meals on Sunday, 7/20, between 4-6pm.  Pt may also start "fruit and vegetable fasts" later this summer. Discussed with patient that decreasing food consumption and varying the types of foods in our diet can alter the INR level. He will keep [place] updated of his fasting/eating practices at each INR check. Pt knows to call us if he notices any excessive/unusual bruising or bleeding or develops other concerns. Continue Coumadin 6mg  daily except 7 mg on MWF. Recheck INR on 10/02/12 at 2pm for lab and 2:15pm for Coumadin clinic.   Samples provided: Coumadin 1mg  tablets (x 50 tablets)                                Lot: [id number], Exp: 03/2013                                Coumadin 5mg  tablets (x 40 tablets)                                Lot: [id number], Exp: 3/15

## 2012-09-10 ENCOUNTER — Telehealth: Payer: Self-pay | Admitting: Hematology and Oncology

## 2012-09-10 NOTE — Telephone Encounter (Signed)
UNABLE TO REACH THE PT BY NONE OF THE PHONE NUMBERS LISTED ON THE DEMOGRAPHICS PAGE. MAILED THE PT HIS NOV 2014 APPTS.

## 2012-10-01 ENCOUNTER — Other Ambulatory Visit (HOSPITAL_COMMUNITY): Payer: Self-pay | Admitting: Cardiology

## 2012-10-01 DIAGNOSIS — I2699 Other pulmonary embolism without acute cor pulmonale: Secondary | ICD-10-CM

## 2012-10-02 ENCOUNTER — Other Ambulatory Visit: Payer: Self-pay | Admitting: Lab

## 2012-10-02 ENCOUNTER — Ambulatory Visit (HOSPITAL_BASED_OUTPATIENT_CLINIC_OR_DEPARTMENT_OTHER): Payer: Self-pay | Admitting: Pharmacist

## 2012-10-02 DIAGNOSIS — I2699 Other pulmonary embolism without acute cor pulmonale: Secondary | ICD-10-CM

## 2012-10-02 DIAGNOSIS — Z7901 Long term (current) use of anticoagulants: Secondary | ICD-10-CM

## 2012-10-02 LAB — PROTIME-INR
INR: 4 — ABNORMAL HIGH (ref 2.00–3.50)
Protime: 48 Seconds — ABNORMAL HIGH (ref 10.6–13.4)

## 2012-10-02 NOTE — Patient Instructions (Addendum)
Hold coumadin today.  On 10/03/12, resume 6mg  daily except 7mg  on MWF. Recheck INR on 10/16/12 at 2pm for lab and 2:15pm for Coumadin clinic.

## 2012-10-02 NOTE — Progress Notes (Signed)
INR above goal today. No problems to report regarding anticoagulation. No changes in medications. No missed coumadin doses. Pt usually fasts the last Thursday of every month. He did 2 fasts during July (July 17-20 and 7/31-8/3). He ate light on 8/4 and 8/5. Diet to return to normal today. His next fast is scheduled for 8/28 -8/31. No food, only water from ~4-6pm on Thursday until ~4-6pm on Sunday. Pt has been stable on this dose through previous (less frequent) fasts. Hold coumadin today.  On 10/03/12, continue Coumadin 6mg  daily except 7 mg on MWF.   Recheck INR on 10/16/12 at 2pm for lab and 2:15pm for Coumadin clinic. May need to adjust coumadin dose if fasting becomes more frequent.

## 2012-10-08 ENCOUNTER — Ambulatory Visit (HOSPITAL_COMMUNITY)
Admission: RE | Admit: 2012-10-08 | Discharge: 2012-10-08 | Disposition: A | Discharge status: Home / Self Care | Payer: Self-pay | Source: Ambulatory Visit | Attending: Cardiology | Admitting: Cardiology

## 2012-10-08 DIAGNOSIS — I379 Nonrheumatic pulmonary valve disorder, unspecified: Secondary | ICD-10-CM | POA: Insufficient documentation

## 2012-10-08 DIAGNOSIS — I2699 Other pulmonary embolism without acute cor pulmonale: Secondary | ICD-10-CM | POA: Insufficient documentation

## 2012-10-08 NOTE — Progress Notes (Signed)
  Echocardiogram 2D Echocardiogram has been performed.  Georgian Co 10/08/2012, 11:09 AM

## 2012-10-16 ENCOUNTER — Ambulatory Visit: Payer: Self-pay

## 2012-10-16 ENCOUNTER — Other Ambulatory Visit: Payer: Self-pay

## 2012-10-16 ENCOUNTER — Telehealth: Payer: Self-pay | Admitting: Pharmacist

## 2012-10-16 NOTE — Telephone Encounter (Signed)
Called for patient to call back to reschedule coumadin clinic and lab appointment. Patient missed today's appointment. Unable to reach patient or leave a voicemail at any of the patient's phone numbers listed. The message stated "unable to complete this call at this time" will try again to reschedule  Daniel Glover, PharmD

## 2012-11-14 ENCOUNTER — Telehealth: Payer: Self-pay | Admitting: Pharmacist

## 2013-01-06 ENCOUNTER — Other Ambulatory Visit: Payer: Self-pay

## 2013-01-06 DIAGNOSIS — I2699 Other pulmonary embolism without acute cor pulmonale: Secondary | ICD-10-CM

## 2013-01-07 ENCOUNTER — Ambulatory Visit: Payer: Self-pay

## 2013-01-07 ENCOUNTER — Other Ambulatory Visit: Payer: Self-pay | Admitting: Lab

## 2013-03-21 ENCOUNTER — Emergency Department (HOSPITAL_COMMUNITY)
Admission: EM | Admit: 2013-03-21 | Discharge: 2013-03-21 | Disposition: A | Discharge status: Home / Self Care | Payer: No Typology Code available for payment source | Attending: Emergency Medicine | Admitting: Emergency Medicine

## 2013-03-21 ENCOUNTER — Emergency Department (HOSPITAL_COMMUNITY): Payer: No Typology Code available for payment source

## 2013-03-21 ENCOUNTER — Encounter (HOSPITAL_COMMUNITY): Payer: Self-pay | Admitting: Emergency Medicine

## 2013-03-21 DIAGNOSIS — M25532 Pain in left wrist: Secondary | ICD-10-CM

## 2013-03-21 DIAGNOSIS — S6990XA Unspecified injury of unspecified wrist, hand and finger(s), initial encounter: Secondary | ICD-10-CM | POA: Insufficient documentation

## 2013-03-21 DIAGNOSIS — M25511 Pain in right shoulder: Secondary | ICD-10-CM

## 2013-03-21 DIAGNOSIS — R0789 Other chest pain: Secondary | ICD-10-CM

## 2013-03-21 DIAGNOSIS — F17200 Nicotine dependence, unspecified, uncomplicated: Secondary | ICD-10-CM | POA: Insufficient documentation

## 2013-03-21 DIAGNOSIS — S4980XA Other specified injuries of shoulder and upper arm, unspecified arm, initial encounter: Secondary | ICD-10-CM | POA: Insufficient documentation

## 2013-03-21 DIAGNOSIS — S46909A Unspecified injury of unspecified muscle, fascia and tendon at shoulder and upper arm level, unspecified arm, initial encounter: Secondary | ICD-10-CM | POA: Insufficient documentation

## 2013-03-21 DIAGNOSIS — S59909A Unspecified injury of unspecified elbow, initial encounter: Secondary | ICD-10-CM | POA: Insufficient documentation

## 2013-03-21 DIAGNOSIS — S199XXA Unspecified injury of neck, initial encounter: Principal | ICD-10-CM

## 2013-03-21 DIAGNOSIS — Z7982 Long term (current) use of aspirin: Secondary | ICD-10-CM | POA: Insufficient documentation

## 2013-03-21 DIAGNOSIS — S0993XA Unspecified injury of face, initial encounter: Secondary | ICD-10-CM | POA: Insufficient documentation

## 2013-03-21 DIAGNOSIS — M542 Cervicalgia: Secondary | ICD-10-CM

## 2013-03-21 DIAGNOSIS — S59919A Unspecified injury of unspecified forearm, initial encounter: Secondary | ICD-10-CM

## 2013-03-21 DIAGNOSIS — Y9389 Activity, other specified: Secondary | ICD-10-CM | POA: Insufficient documentation

## 2013-03-21 DIAGNOSIS — Z8701 Personal history of pneumonia (recurrent): Secondary | ICD-10-CM | POA: Insufficient documentation

## 2013-03-21 DIAGNOSIS — Z86711 Personal history of pulmonary embolism: Secondary | ICD-10-CM | POA: Insufficient documentation

## 2013-03-21 DIAGNOSIS — S298XXA Other specified injuries of thorax, initial encounter: Secondary | ICD-10-CM | POA: Insufficient documentation

## 2013-03-21 DIAGNOSIS — Y92410 Unspecified street and highway as the place of occurrence of the external cause: Secondary | ICD-10-CM | POA: Insufficient documentation

## 2013-03-21 HISTORY — DX: Other pulmonary embolism without acute cor pulmonale: I26.99

## 2013-03-21 MED ORDER — ACETAMINOPHEN 325 MG PO TABS
650.0000 mg | ORAL_TABLET | Freq: Once | ORAL | Status: AC
  Administered 2013-03-21: 650 mg via ORAL
  Filled 2013-03-21: qty 2

## 2013-03-21 MED ORDER — TRAMADOL HCL 50 MG PO TABS: 50.0000 mg | ORAL_TABLET | Freq: Four times a day (QID) | ORAL | Status: DC | PRN

## 2013-03-21 MED ORDER — TRAMADOL HCL 50 MG PO TABS
50.0000 mg | ORAL_TABLET | Freq: Once | ORAL | Status: AC
  Administered 2013-03-21: 50 mg via ORAL
  Filled 2013-03-21: qty 1

## 2013-03-21 NOTE — ED Provider Notes (Signed)
Medical screening examination/treatment/procedure(s) were performed by non-physician practitioner and as supervising physician I was immediately available for consultation/collaboration.     Boomer Winders, MD 03/21/13 2339 
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
## 2013-10-23 NOTE — Telephone Encounter (Signed)
Encounter was telephone call. 

## 2013-11-27 ENCOUNTER — Encounter: Payer: Self-pay | Admitting: General Surgery

## 2014-04-17 NOTE — Telephone Encounter (Signed)
No entry 

## 2014-10-23 ENCOUNTER — Encounter: Payer: Self-pay | Admitting: Pharmacist

## 2014-10-23 NOTE — Progress Notes (Signed)
Attempted to send patient the letter explaining [facility] coumadin clinic closing and anticoagulation care transition procedure. Letter mailed to [address], [place], [place] [address]. Letter was returned as undeliverable and unable to forward. It appears patient has stopped coumadin on his own. Will notify provider.

## 2016-11-12 ENCOUNTER — Emergency Department (HOSPITAL_COMMUNITY): Payer: Self-pay | Admitting: Anesthesiology

## 2016-11-12 ENCOUNTER — Emergency Department (HOSPITAL_COMMUNITY): Payer: Self-pay

## 2016-11-12 ENCOUNTER — Encounter (HOSPITAL_COMMUNITY)
Admission: EM | Disposition: A | Discharge status: Home / Self Care | Payer: Self-pay | Source: Home / Self Care | Attending: Vascular Surgery

## 2016-11-12 ENCOUNTER — Inpatient Hospital Stay (HOSPITAL_COMMUNITY)
Admission: EM | Admit: 2016-11-12 | Discharge: 2016-11-15 | DRG: 272 | Disposition: A | Discharge status: Home / Self Care | Payer: Self-pay | Attending: Vascular Surgery | Admitting: Vascular Surgery

## 2016-11-12 ENCOUNTER — Encounter (HOSPITAL_COMMUNITY): Payer: Self-pay | Admitting: Emergency Medicine

## 2016-11-12 ENCOUNTER — Emergency Department (HOSPITAL_COMMUNITY): Admit: 2016-11-12 | Payer: Self-pay

## 2016-11-12 DIAGNOSIS — Z419 Encounter for procedure for purposes other than remedying health state, unspecified: Secondary | ICD-10-CM

## 2016-11-12 DIAGNOSIS — I70202 Unspecified atherosclerosis of native arteries of extremities, left leg: Secondary | ICD-10-CM | POA: Diagnosis present

## 2016-11-12 DIAGNOSIS — Z7982 Long term (current) use of aspirin: Secondary | ICD-10-CM

## 2016-11-12 DIAGNOSIS — Z7901 Long term (current) use of anticoagulants: Secondary | ICD-10-CM

## 2016-11-12 DIAGNOSIS — Z86711 Personal history of pulmonary embolism: Secondary | ICD-10-CM

## 2016-11-12 DIAGNOSIS — I998 Other disorder of circulatory system: Principal | ICD-10-CM | POA: Diagnosis present

## 2016-11-12 DIAGNOSIS — F17210 Nicotine dependence, cigarettes, uncomplicated: Secondary | ICD-10-CM | POA: Diagnosis present

## 2016-11-12 DIAGNOSIS — Z86718 Personal history of other venous thrombosis and embolism: Secondary | ICD-10-CM

## 2016-11-12 DIAGNOSIS — I739 Peripheral vascular disease, unspecified: Secondary | ICD-10-CM | POA: Diagnosis present

## 2016-11-12 HISTORY — PX: THROMBECTOMY FEMORAL ARTERY: SHX6406

## 2016-11-12 LAB — BASIC METABOLIC PANEL
Anion gap: 8 (ref 5–15)
BUN: 15 mg/dL (ref 6–20)
CO2: 27 mmol/L (ref 22–32)
CREATININE: 1.28 mg/dL — AB (ref 0.61–1.24)
Calcium: 9.1 mg/dL (ref 8.9–10.3)
Chloride: 105 mmol/L (ref 101–111)
GFR calc Af Amer: >60 mL/min (ref >60)
GFR calc non Af Amer: >60 mL/min (ref >60)
Glucose, Bld: 105 mg/dL — ABNORMAL HIGH (ref 65–99)
Potassium: 3.6 mmol/L (ref 3.5–5.1)
Sodium: 140 mmol/L (ref 135–145)

## 2016-11-12 LAB — CBC
HEMATOCRIT: 44.9 % (ref 39.0–52.0)
HEMOGLOBIN: 15.2 g/dL (ref 13.0–17.0)
MCH: 29.4 pg (ref 26.0–34.0)
MCHC: 33.9 g/dL (ref 30.0–36.0)
MCV: 86.8 fL (ref 78.0–100.0)
PLATELETS: 237 10*3/uL (ref 150–400)
RBC: 5.17 MIL/uL (ref 4.22–5.81)
RDW: 13.7 % (ref 11.5–15.5)
WBC: 8.3 10*3/uL (ref 4.0–10.5)

## 2016-11-12 LAB — I-STAT TROPONIN, ED: Troponin i, poc: 0 ng/mL (ref 0.00–0.08)

## 2016-11-12 SURGERY — THROMBECTOMY, ARTERY, FEMORAL
Anesthesia: General | Laterality: Left

## 2016-11-12 MED ORDER — HEPARIN BOLUS VIA INFUSION
4000.0000 [IU] | Freq: Once | INTRAVENOUS | Status: AC
Start: 2016-11-12 — End: 2016-11-12
  Administered 2016-11-12: 4000 [IU] via INTRAVENOUS
  Filled 2016-11-12: qty 4000

## 2016-11-12 MED ORDER — PANTOPRAZOLE SODIUM 40 MG PO TBEC
40.0000 mg | DELAYED_RELEASE_TABLET | Freq: Every day | ORAL | Status: DC
  Administered 2016-11-13 – 2016-11-15 (×3): 40 mg via ORAL
  Filled 2016-11-12 (×3): qty 1

## 2016-11-12 MED ORDER — OXYCODONE HCL 5 MG PO TABS: 5.0000 mg | ORAL_TABLET | Freq: Once | ORAL | Status: DC | PRN

## 2016-11-12 MED ORDER — MIDAZOLAM HCL 5 MG/5ML IJ SOLN
INTRAMUSCULAR | Status: DC | PRN
  Administered 2016-11-12 (×2): 1 mg via INTRAVENOUS

## 2016-11-12 MED ORDER — ACETAMINOPHEN 325 MG PO TABS: 325.0000 mg | ORAL_TABLET | ORAL | Status: DC | PRN

## 2016-11-12 MED ORDER — ONDANSETRON HCL 4 MG/2ML IJ SOLN
INTRAMUSCULAR | Status: DC | PRN
  Administered 2016-11-12: 4 mg via INTRAVENOUS

## 2016-11-12 MED ORDER — ONDANSETRON HCL 4 MG/2ML IJ SOLN
INTRAMUSCULAR | Status: AC
  Filled 2016-11-12: qty 2

## 2016-11-12 MED ORDER — POTASSIUM CHLORIDE CRYS ER 20 MEQ PO TBCR: 20.0000 meq | EXTENDED_RELEASE_TABLET | Freq: Once | ORAL | Status: DC | PRN

## 2016-11-12 MED ORDER — ARTIFICIAL TEARS OPHTHALMIC OINT
TOPICAL_OINTMENT | OPHTHALMIC | Status: DC | PRN
  Administered 2016-11-12: 1 via OPHTHALMIC

## 2016-11-12 MED ORDER — DOCUSATE SODIUM 100 MG PO CAPS
100.0000 mg | ORAL_CAPSULE | Freq: Every day | ORAL | Status: DC
  Administered 2016-11-13 – 2016-11-15 (×3): 100 mg via ORAL
  Filled 2016-11-12 (×3): qty 1

## 2016-11-12 MED ORDER — SODIUM CHLORIDE 0.9 % IV SOLN: 500.0000 mL | Freq: Once | INTRAVENOUS | Status: DC | PRN

## 2016-11-12 MED ORDER — LIDOCAINE 2% (20 MG/ML) 5 ML SYRINGE
INTRAMUSCULAR | Status: AC
  Filled 2016-11-12: qty 5

## 2016-11-12 MED ORDER — 0.9 % SODIUM CHLORIDE (POUR BTL) OPTIME
TOPICAL | Status: DC | PRN
  Administered 2016-11-12: 2000 mL

## 2016-11-12 MED ORDER — KCL IN DEXTROSE-NACL 20-5-0.45 MEQ/L-%-% IV SOLN
INTRAVENOUS | Status: DC
  Administered 2016-11-12 – 2016-11-13 (×2): via INTRAVENOUS
  Filled 2016-11-12 (×2): qty 1000

## 2016-11-12 MED ORDER — PHENOL 1.4 % MT LIQD
1.0000 | OROMUCOSAL | Status: DC | PRN
  Administered 2016-11-13: 1 via OROMUCOSAL
  Filled 2016-11-12: qty 177

## 2016-11-12 MED ORDER — HEPARIN SODIUM (PORCINE) 1000 UNIT/ML IJ SOLN
INTRAMUSCULAR | Status: AC
  Filled 2016-11-12: qty 1

## 2016-11-12 MED ORDER — FENTANYL CITRATE (PF) 100 MCG/2ML IJ SOLN: 25.0000 ug | INTRAMUSCULAR | Status: DC | PRN

## 2016-11-12 MED ORDER — LABETALOL HCL 5 MG/ML IV SOLN: 10.0000 mg | INTRAVENOUS | Status: DC | PRN

## 2016-11-12 MED ORDER — HEPARIN (PORCINE) IN NACL 100-0.45 UNIT/ML-% IJ SOLN
1100.0000 [IU]/h | INTRAMUSCULAR | Status: DC
  Administered 2016-11-13: 600 [IU]/h via INTRAVENOUS
  Filled 2016-11-12: qty 250

## 2016-11-12 MED ORDER — MIDAZOLAM HCL 2 MG/2ML IJ SOLN
INTRAMUSCULAR | Status: AC
  Filled 2016-11-12: qty 2

## 2016-11-12 MED ORDER — OXYCODONE-ACETAMINOPHEN 5-325 MG PO TABS
ORAL_TABLET | ORAL | Status: AC
  Filled 2016-11-12: qty 1

## 2016-11-12 MED ORDER — SUCCINYLCHOLINE CHLORIDE 20 MG/ML IJ SOLN
INTRAMUSCULAR | Status: DC | PRN
  Administered 2016-11-12: 80 mg via INTRAVENOUS

## 2016-11-12 MED ORDER — HEPARIN (PORCINE) IN NACL 100-0.45 UNIT/ML-% IJ SOLN
1300.0000 [IU]/h | INTRAMUSCULAR | Status: DC
  Administered 2016-11-12: 1300 [IU]/h via INTRAVENOUS
  Filled 2016-11-12: qty 250

## 2016-11-12 MED ORDER — OXYCODONE-ACETAMINOPHEN 5-325 MG PO TABS
1.0000 | ORAL_TABLET | Freq: Once | ORAL | Status: AC
  Administered 2016-11-12: 1 via ORAL

## 2016-11-12 MED ORDER — ARTIFICIAL TEARS OPHTHALMIC OINT
TOPICAL_OINTMENT | OPHTHALMIC | Status: AC
  Filled 2016-11-12: qty 3.5

## 2016-11-12 MED ORDER — ONDANSETRON HCL 4 MG/2ML IJ SOLN: 4.0000 mg | Freq: Four times a day (QID) | INTRAMUSCULAR | Status: DC | PRN

## 2016-11-12 MED ORDER — PHENYLEPHRINE HCL 10 MG/ML IJ SOLN
INTRAVENOUS | Status: DC | PRN
  Administered 2016-11-12: 25 ug/min via INTRAVENOUS

## 2016-11-12 MED ORDER — ACETAMINOPHEN 650 MG RE SUPP: 325.0000 mg | RECTAL | Status: DC | PRN

## 2016-11-12 MED ORDER — LACTATED RINGERS IV SOLN
INTRAVENOUS | Status: DC | PRN
  Administered 2016-11-12 (×2): via INTRAVENOUS

## 2016-11-12 MED ORDER — HYDRALAZINE HCL 20 MG/ML IJ SOLN: 5.0000 mg | INTRAMUSCULAR | Status: DC | PRN

## 2016-11-12 MED ORDER — ROCURONIUM BROMIDE 10 MG/ML (PF) SYRINGE
PREFILLED_SYRINGE | INTRAVENOUS | Status: AC
  Filled 2016-11-12: qty 5

## 2016-11-12 MED ORDER — DEXAMETHASONE SODIUM PHOSPHATE 10 MG/ML IJ SOLN
INTRAMUSCULAR | Status: DC | PRN
  Administered 2016-11-12: 10 mg via INTRAVENOUS

## 2016-11-12 MED ORDER — LIDOCAINE HCL (CARDIAC) 20 MG/ML IV SOLN
INTRAVENOUS | Status: DC | PRN
  Administered 2016-11-12: 60 mg via INTRAVENOUS

## 2016-11-12 MED ORDER — DEXTROSE 5 % IV SOLN
1.5000 g | Freq: Two times a day (BID) | INTRAVENOUS | Status: AC
  Administered 2016-11-12 – 2016-11-13 (×2): 1.5 g via INTRAVENOUS
  Filled 2016-11-12 (×2): qty 1.5

## 2016-11-12 MED ORDER — METOPROLOL TARTRATE 5 MG/5ML IV SOLN: 2.0000 mg | INTRAVENOUS | Status: DC | PRN

## 2016-11-12 MED ORDER — MORPHINE SULFATE (PF) 2 MG/ML IV SOLN: 2.0000 mg | INTRAVENOUS | Status: DC | PRN

## 2016-11-12 MED ORDER — HEPARIN SODIUM (PORCINE) 1000 UNIT/ML IJ SOLN
INTRAMUSCULAR | Status: DC | PRN
  Administered 2016-11-12: 7000 [IU] via INTRAVENOUS

## 2016-11-12 MED ORDER — IOPAMIDOL (ISOVUE-370) INJECTION 76%
INTRAVENOUS | Status: AC
  Administered 2016-11-12: 100 mL
  Filled 2016-11-12: qty 100

## 2016-11-12 MED ORDER — METHOCARBAMOL 500 MG PO TABS
500.0000 mg | ORAL_TABLET | Freq: Once | ORAL | Status: AC
  Administered 2016-11-12: 500 mg via ORAL
  Filled 2016-11-12: qty 1

## 2016-11-12 MED ORDER — DEXTROSE 5 % IV SOLN
INTRAVENOUS | Status: AC
  Filled 2016-11-12: qty 1.5

## 2016-11-12 MED ORDER — FENTANYL CITRATE (PF) 250 MCG/5ML IJ SOLN
INTRAMUSCULAR | Status: AC
  Filled 2016-11-12: qty 5

## 2016-11-12 MED ORDER — DEXAMETHASONE SODIUM PHOSPHATE 10 MG/ML IJ SOLN
INTRAMUSCULAR | Status: AC
  Filled 2016-11-12: qty 1

## 2016-11-12 MED ORDER — FENTANYL CITRATE (PF) 100 MCG/2ML IJ SOLN
INTRAMUSCULAR | Status: DC | PRN
  Administered 2016-11-12: 50 ug via INTRAVENOUS
  Administered 2016-11-12: 100 ug via INTRAVENOUS

## 2016-11-12 MED ORDER — PROPOFOL 10 MG/ML IV BOLUS
INTRAVENOUS | Status: DC | PRN
  Administered 2016-11-12: 160 mg via INTRAVENOUS

## 2016-11-12 MED ORDER — SODIUM CHLORIDE 0.9 % IV SOLN
INTRAVENOUS | Status: DC | PRN
  Administered 2016-11-12: 19:00:00 500 mL

## 2016-11-12 MED ORDER — ALUM & MAG HYDROXIDE-SIMETH 200-200-20 MG/5ML PO SUSP
15.0000 mL | ORAL | Status: DC | PRN
  Administered 2016-11-13 (×3): 30 mL via ORAL
  Filled 2016-11-12 (×3): qty 30

## 2016-11-12 MED ORDER — SUCCINYLCHOLINE CHLORIDE 200 MG/10ML IV SOSY
PREFILLED_SYRINGE | INTRAVENOUS | Status: AC
  Filled 2016-11-12: qty 10

## 2016-11-12 MED ORDER — OXYCODONE HCL 5 MG/5ML PO SOLN: 5.0000 mg | Freq: Once | ORAL | Status: DC | PRN

## 2016-11-12 MED ORDER — ACETAMINOPHEN 160 MG/5ML PO SOLN: 325.0000 mg | ORAL | Status: DC | PRN

## 2016-11-12 MED ORDER — OXYCODONE-ACETAMINOPHEN 5-325 MG PO TABS
1.0000 | ORAL_TABLET | ORAL | Status: DC | PRN
  Administered 2016-11-12 – 2016-11-13 (×2): 2 via ORAL
  Administered 2016-11-14: 1 via ORAL
  Administered 2016-11-14: 2 via ORAL
  Filled 2016-11-12: qty 1
  Filled 2016-11-12 (×3): qty 2

## 2016-11-12 MED ORDER — GUAIFENESIN-DM 100-10 MG/5ML PO SYRP: 15.0000 mL | ORAL_SOLUTION | ORAL | Status: DC | PRN

## 2016-11-12 MED ORDER — DEXTROSE 5 % IV SOLN
INTRAVENOUS | Status: DC | PRN
  Administered 2016-11-12: 1.5 g via INTRAVENOUS

## 2016-11-12 MED ORDER — PROPOFOL 10 MG/ML IV BOLUS
INTRAVENOUS | Status: AC
  Filled 2016-11-12: qty 20

## 2016-11-12 SURGICAL SUPPLY — 32 items
CATH EMB 3FR 80CM (CATHETERS) ×3 IMPLANT
CATH EMB 4FR 80CM (CATHETERS) ×3 IMPLANT
CLIP LIGATING EXTRA MED SLVR (CLIP) ×3 IMPLANT
CLIP LIGATING EXTRA SM BLUE (MISCELLANEOUS) ×3 IMPLANT
CONT SPECI 4OZ STER CLIK (MISCELLANEOUS) ×3 IMPLANT
DERMABOND ADVANCED (GAUZE/BANDAGES/DRESSINGS) ×2
DERMABOND ADVANCED .7 DNX12 (GAUZE/BANDAGES/DRESSINGS) ×1 IMPLANT
ELECT REM PT RETURN 9FT ADLT (ELECTROSURGICAL) ×3
ELECTRODE REM PT RTRN 9FT ADLT (ELECTROSURGICAL) ×1 IMPLANT
GLOVE BIO SURGEON STRL SZ 6.5 (GLOVE) ×2 IMPLANT
GLOVE BIO SURGEONS STRL SZ 6.5 (GLOVE) ×1
GLOVE BIOGEL PI IND STRL 6.5 (GLOVE) ×4 IMPLANT
GLOVE BIOGEL PI IND STRL 7.5 (GLOVE) ×2 IMPLANT
GLOVE BIOGEL PI INDICATOR 6.5 (GLOVE) ×8
GLOVE BIOGEL PI INDICATOR 7.5 (GLOVE) ×4
GLOVE SS BIOGEL STRL SZ 7.5 (GLOVE) ×3 IMPLANT
GLOVE SUPERSENSE BIOGEL SZ 7.5 (GLOVE) ×6
GOWN STRL REUS W/ TWL LRG LVL3 (GOWN DISPOSABLE) ×3 IMPLANT
GOWN STRL REUS W/ TWL XL LVL3 (GOWN DISPOSABLE) ×1 IMPLANT
GOWN STRL REUS W/TWL LRG LVL3 (GOWN DISPOSABLE) ×6
GOWN STRL REUS W/TWL XL LVL3 (GOWN DISPOSABLE) ×2
KIT BASIN OR (CUSTOM PROCEDURE TRAY) ×3 IMPLANT
NS IRRIG 1000ML POUR BTL (IV SOLUTION) ×6 IMPLANT
PACK PERIPHERAL VASCULAR (CUSTOM PROCEDURE TRAY) ×3 IMPLANT
PAD ARMBOARD 7.5X6 YLW CONV (MISCELLANEOUS) ×6 IMPLANT
SUT PROLENE 6 0 BV (SUTURE) ×6 IMPLANT
SUT VIC AB 2-0 CTX 36 (SUTURE) ×6 IMPLANT
SUT VIC AB 3-0 SH 27 (SUTURE) ×2
SUT VIC AB 3-0 SH 27X BRD (SUTURE) ×1 IMPLANT
SYRINGE 3CC LL L/F (MISCELLANEOUS) ×6 IMPLANT
TRAY FOLEY W/METER SILVER 16FR (SET/KITS/TRAYS/PACK) ×3 IMPLANT
WATER STERILE IRR 1000ML POUR (IV SOLUTION) ×3 IMPLANT

## 2016-11-12 NOTE — ED Notes (Signed)
Per CT, pt needs second IV for scan due to heparin running. Will start second line.

## 2016-11-12 NOTE — ED Notes (Signed)
Pt belongings in pt belongings bag, placed on

## 2016-11-12 NOTE — ED Notes (Addendum)
Pt asking for something to eat, explained to pt NPO status, pt verbalized understanding. Pt given urinal. Pain 5/10. Pt also reports "heartburn like pain" after contrast administration. Casimiro Needle, PA notified by pt request.

## 2016-11-12 NOTE — ED Notes (Signed)
Pt in XR at this time. Heparin set up on pump at the bedside, will start IV and administer ordered heparin upon pt return.

## 2016-11-12 NOTE — Progress Notes (Signed)
ANTICOAGULATION CONSULT NOTE - Initial Consult  Pharmacy Consult for heparin Indication: DVT  No Known Allergies  Patient Measurements: Height:  (167.6 cm) Weight: 180 lb (81.6 kg) IBW/kg (Calculated) : 63.8 Heparin Dosing Weight: 80.3  Vital Signs: Temp: 99 F (37.2 C) (09/16 1152) Temp Source: Oral (09/16 1152) BP: 141/86 (09/16 1530) Pulse Rate: 62 (09/16 1530)  Labs:  Recent Labs  11/12/16 1154  HGB 15.2  HCT 44.9  PLT 237  CREATININE 1.28*    Estimated Creatinine Clearance: 66.2 mL/min (A) (by C-G formula based on SCr of 1.28 mg/dL (H)).   Medical History: Past Medical History:  Diagnosis Date  . 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 PE (pulmonary embolism)   . Pneumonia     Medications:  Scheduled:  . heparin  4,000 Units Intravenous Once    Assessment: 56 yom c/o left leg and arm pain with history of blood clots. No signs/symptoms of bleeding noted.  Goal of Therapy:  Heparin level 0.3-0.7 units/ml Monitor platelets by anticoagulation protocol: Yes   Plan:  Give 4000 units bolus x 1 Start heparin infusion at 1300 units/hr Check anti-Xa level in 6 hours and daily while on heparin Continue to monitor H&H and platelets  Girard Cooter, PharmD Clinical Pharmacist  Phone: [phone] 11/12/2016,3:42 PM

## 2016-11-12 NOTE — Anesthesia Preprocedure Evaluation (Addendum)
Anesthesia Evaluation  Patient identified by MRN, date of birth, ID band Patient awake    Reviewed: Allergy & Precautions, NPO status , Patient's Chart, lab work & pertinent test results  History of Anesthesia Complications Negative for: history of anesthetic complications  Airway Mallampati: II  TM Distance: >3 FB Neck ROM: Full    Dental  (+) Teeth Intact, Dental Advisory Given   Pulmonary Current Smoker, PE H/o PE from DVT    breath sounds clear to auscultation       Cardiovascular negative cardio ROS   Rhythm:Regular     Neuro/Psych negative neurological ROS  negative psych ROS   GI/Hepatic negative GI ROS, Neg liver ROS,   Endo/Other  negative endocrine ROS  Renal/GU Renal InsufficiencyRenal disease     Musculoskeletal   Abdominal   Peds  Hematology negative hematology ROS (+)   Anesthesia Other Findings   Reproductive/Obstetrics                            Anesthesia Physical Anesthesia Plan  ASA: II and emergent  Anesthesia Plan: General   Post-op Pain Management:    Induction: Intravenous, Rapid sequence and Cricoid pressure planned  PONV Risk Score and Plan: 1 and Ondansetron and Dexamethasone  Airway Management Planned: Oral ETT  Additional Equipment: None  Intra-op Plan:   Post-operative Plan: Extubation in OR  Informed Consent: I have reviewed the patients History and Physical, chart, labs and discussed the procedure including the risks, benefits and alternatives for the proposed anesthesia with the patient or authorized representative who has indicated his/her understanding and acceptance.   Dental advisory given  Plan Discussed with: CRNA and Surgeon  Anesthesia Plan Comments:         Anesthesia Quick Evaluation

## 2016-11-12 NOTE — ED Notes (Signed)
ED Provider at bedside. 

## 2016-11-12 NOTE — ED Provider Notes (Signed)
[facility]-EMERGENCY DEPT Provider Note   CSN: [id number] Arrival date & time: 11/12/16  1140     History   Chief Complaint Chief Complaint  Patient presents with  . Leg Pain    left   . Arm Pain    left    HPI Daniel Glover is a 56 y.o. male with a history of DVT, bilateral PE who presents to the emergency department who presents emergency department today for left leg pain that began at approximately 11 AM this morning. The patient states that 10 minutes prior to the onset of the pain he was able to stand and walk to the bathroom without pain. When he returned to his recliner he felt "sesnation like my leg was going to sleep" at the left anterior ankle. This continued into a sharp shooting pain that radiated from his left ankle to all the way up to his left chest and down his left arm. No associated SOB, nausea, or diaphoresis. He states that these felt like a shocking sensation that would occur intermittently for brief seconds. This lasted until he arrived at the emergency department had a Percocet. He states this relieved the chest pain and arm pain. He is only now having dull, achy pain in the left leg and occasionally cramps of the left hamstring. He notes that it was initially more painful with walking, plantar flexion and dorsiflexion of the left ankle. No leg swelling, or cough. Denies syncope, lightheadedness, dizziness, HA, weakness, or emesis. He is not on blood thinners and has been off them for several years. Negative hypercoagulable workup in the past. Patient is a 15 pack year smoker. Denies any cocaine or IVDU. He notes that he has history of b/l 3rd degree frostbite of both feet that causes some numbness chronically. He takes no medication on a daily basis.   HPI  Past Medical History:  Diagnosis Date  . 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 PE (pulmonary embolism)   . Pneumonia     Patient Active Problem List   Diagnosis Date Noted  .  Limb ischemia 11/12/2016  . PAD (peripheral artery disease) (HCC) 11/12/2016  . Right atrial thrombus 01/09/2012  . Pleuritic Chest Pain  01/07/2012  . Acute Bilateral PE (pulmonary embolism) 01/07/2012  . Acute DVT Right LE 01/07/2012    Past Surgical History:  Procedure Laterality Date  . APPENDECTOMY    . arm surgery    . TONSILLECTOMY         Home Medications    Prior to Admission medications   Medication Sig Start Date End Date Taking? Authorizing Provider  Aspirin-Acetaminophen (GOODYS BODY PAIN PO) Take 1 Package by mouth 2 (two) times daily as needed (pain).   Yes [provider]  HYDROcodone-acetaminophen (NORCO/VICODIN) 5-325 MG tablet Take 2 tablets by mouth once. Obtained 2 tabs from his son to take   Yes [provider]  traMADol (ULTRAM) 50 MG tablet Take 1 tablet (50 mg total) by mouth every 6 (six) hours as needed. 03/21/13   Lurene Shadow, PA-C    Family History Family History  Problem Relation Age of Onset  . Lupus Mother   . Bleeding Disorder Father   . Hypertension Brother     Social History Social History  Substance Use Topics  . Smoking status: Current Every Day Smoker    Packs/day: 0.25    Years: 15.00    Types: Cigarettes  . Smokeless tobacco: Never  Used  . Alcohol use Yes     Comment: occassionally     Allergies   Patient has no known allergies.   Review of Systems Review of Systems  All other systems reviewed and are negative.    Physical Exam Updated Vital Signs BP 121/66 (BP Location: Right Arm)   Pulse 61   Temp 97.7 F (36.5 C) (Oral)   Resp 17   Ht  (1.676 m)   Wt 83.3 kg (183 lb 11.2 oz)   SpO2 96%   BMI 29.65 kg/m   Physical Exam  Constitutional: He appears well-developed and well-nourished. No distress.  Non-toxic appearing, resting in NAD  HENT:  Head: Normocephalic and atraumatic.  Right Ear: External ear normal.  Left Ear: External ear normal.  Nose: Nose normal.  Mouth/Throat:  Uvula is midline, oropharynx is clear and moist and mucous membranes are normal. No tonsillar exudate.  Eyes: Pupils are equal, round, and reactive to light. Right eye exhibits no discharge. Left eye exhibits no discharge. No scleral icterus.  Neck: Trachea normal and normal range of motion. Neck supple. No spinous process tenderness present. No neck rigidity. Normal range of motion present.  Cardiovascular: Normal rate, regular rhythm, normal heart sounds and intact distal pulses.   No murmur heard. Pulses:      Radial pulses are 2+ on the right side, and 2+ on the left side.       Dorsalis pedis pulses are 2+ on the right side, and 0 on the left side.       Posterior tibial pulses are 2+ on the right side, and 0 on the left side.  No lower extremity swelling or edema. Calves symmetric in size bilaterally. Doppler popliteal pulse left intact. Cannot find doppler pulse of left DP or PT.   Pulmonary/Chest: Effort normal and breath sounds normal. No respiratory distress. He exhibits no tenderness.  Abdominal: Soft. Bowel sounds are normal. He exhibits no pulsatile midline mass. There is no tenderness. There is no rigidity, no rebound, no guarding and no CVA tenderness.  Musculoskeletal: He exhibits no edema.       Left shoulder: Normal.       Left elbow: Normal.       Left wrist: Normal.       Left hip: Normal.       Left ankle: Normal. He exhibits normal range of motion and no swelling. Achilles tendon normal. Achilles tendon exhibits no pain and normal Thompson's test results.  Posterior and appearance appears normal. No evidence of obvious scoliosis or kyphosis. No obvious signs of skin changes, trauma, deformity, infection. No C, T, or L spine tenderness or step-offs to palpation. No C, T, or L paraspinal tenderness. ROM normal. Lung expansion normal. Bilateral lower extremity strength 5 out of 5. Patellar and Achilles deep tendon reflex 2+ and equal bilaterally. Straight leg right neg. Straight  leg left neg. Gait able but patient notes painful.  Left knee: Appearance normal. No obvious deformity. No skin swelling, erythema, heat, fluctuance or break of the skin. TTP over. Active and passive flexion and extension. Intact without pain or crepitus. Negative Lachman's test. Negative anterior/poster drawer bilaterally. Negative ballottement test. No varus or valgus laxity or locking. No TTP of knees or ankles. Compartments soft upper and lower leg.   Lymphadenopathy:    He has no cervical adenopathy.  Neurological: He is alert.  Speech clear. Follows commands. No facial droop. PERRLA. EOMI. Normal peripheral fields. CN III-XII intact.  Grossly  moves all extremities 4 without ataxia. Coordination intact. Able and appropriate strength for age to upper and lower extremities bilaterally including grip strength. Normal finger to nose and rapid alternating movements. Normal heel to shin balance. Negative Romberg. No pronator drift. Normal gait but notes painful. Patient reports some numbness to left foot but notes history of frostbite so he always has some numbness.    Skin: Skin is warm, dry and intact. Capillary refill takes less than 2 seconds. No rash noted. He is not diaphoretic. No erythema.  Left leg significantly colder then right leg from level of mid shin down.   Psychiatric: He has a normal mood and affect.  Nursing note and vitals reviewed.    ED Treatments / Results  Labs (all labs ordered are listed, but only abnormal results are displayed) Labs Reviewed  BASIC METABOLIC PANEL - Abnormal; Notable for the following:       Result Value   Glucose, Bld 105 (*)    Creatinine, Ser 1.28 (*)    All other components within normal limits  CBC - Abnormal; Notable for the following:    WBC 13.2 (*)    All other components within normal limits  BASIC METABOLIC PANEL - Abnormal; Notable for the following:    Glucose, Bld 137 (*)    All other components within normal limits  CBC  APTT    I-STAT TROPONIN, ED  SURGICAL PATHOLOGY    EKG  EKG Interpretation  Date/Time:  Sunday November 12 2016 11:47:25 EDT Ventricular Rate:  84 PR Interval:  158 QRS Duration: 72 QT Interval:  374 QTC Calculation: 441 R Axis:   80 Text Interpretation:  Normal sinus rhythm Normal ECG No previous tracing Confirmed by Gwyneth Sprout ([id number]) on 11/12/2016 2:41:09 PM       Radiology Dg Ankle Complete Left  Result Date: 11/12/2016 CLINICAL DATA:  Acute left ankle pain.  Initial encounter. EXAM: LEFT ANKLE COMPLETE - 3+ VIEW COMPARISON:  None. FINDINGS: There is no evidence of fracture, dislocation, or joint effusion. There is no evidence of arthropathy or other focal bone abnormality. Soft tissues are unremarkable. IMPRESSION: Negative. Electronically Signed   By: Harmon Pier M.D.   On: 11/12/2016 16:11   Ct Angio Ao+bifem W & Or Wo Contrast  Result Date: 11/12/2016 CLINICAL DATA:  Pulseless left foot.  Left lower extremity pain. EXAM: CT ANGIOGRAPHY OF ABDOMINAL AORTA WITH ILIOFEMORAL RUNOFF TECHNIQUE: Multidetector CT imaging of the abdomen, pelvis and lower extremities was performed using the standard protocol during bolus administration of intravenous contrast. Multiplanar CT image reconstructions and MIPs were obtained to evaluate the vascular anatomy. CONTRAST:  100 cc Isovue 370 IV. COMPARISON:  01/12/2012 CT abdomen/pelvis. FINDINGS: VASCULAR Aorta: Normal caliber abdominal aorta without aneurysm, dissection, vasculitis or significant stenosis. Celiac: Patent without evidence of aneurysm, dissection, vasculitis or significant stenosis. SMA: Patent without evidence of aneurysm, dissection, vasculitis or significant stenosis. Renals: Single renal arteries bilaterally. Both renal arteries are patent without evidence of aneurysm, dissection, vasculitis, fibromuscular dysplasia or significant stenosis. IMA: Patent without evidence of aneurysm, dissection, vasculitis or significant stenosis.  RIGHT Lower Extremity Inflow: Common, internal and external iliac arteries are patent without evidence of aneurysm, dissection, vasculitis or significant stenosis. Outflow: Common, superficial and profunda femoral arteries and the popliteal artery are patent without evidence of aneurysm, dissection, vasculitis or significant stenosis. Runoff: Patent two vessel (peroneal and posterior tibial) runoff to the ankle, noting patent dorsalis pedis artery arising from the peroneal artery. Anterior tibial artery is  patent proximally and becomes diminutive at the level of the mid to distal tibia. LEFT Lower Extremity Inflow: Common, internal and external iliac arteries are patent without evidence of aneurysm, dissection, vasculitis or significant stenosis. Outflow: Patent common iliac artery. Proximal occlusion of the profunda femoral artery (Series 5/images 204-206). High-grade stenosis of the superficial femoral artery proximally (series 5/image 225). Distal occlusion of the popliteal artery (series 5/images 389-391). Runoff: Zero vessel runoff to the left ankle. Anterior tibial artery, posterior tibial artery and peroneal arteries are occluded. Veins: No obvious venous abnormality within the limitations of this arterial phase study. Review of the MIP images confirms the above findings. NON-VASCULAR Lower chest: Small air cyst in the basilar right lower lobe is stable since 01/12/2012 and nonspecific. No acute abnormality at the lung bases. Hepatobiliary: Normal liver size. Subcentimeter hypodense left liver lobe lesion is too small to characterize and stable, considered benign. No new liver lesions. Normal gallbladder, with no radiopaque cholelithiasis. No biliary ductal dilatation. Pancreas: Normal, with no mass or duct dilation. Spleen: Normal size. No mass. Adrenals/Urinary Tract: Normal adrenals. No hydronephrosis. Subcentimeter hypodense renal cortical lesion in the lateral upper left kidney, too small to  characterize, for which no follow-up is required. No additional contour deforming renal lesions. Normal bladder. Stomach/Bowel: Small hiatal hernia. Otherwise grossly normal stomach. Normal caliber small bowel with no small bowel wall thickening. Appendectomy. Normal large bowel with no diverticulosis, large bowel wall thickening or pericolonic fat stranding. Vascular/Lymphatic: Nonaneurysmal abdominal aorta. No pathologically enlarged lymph nodes in the abdomen or pelvis. Reproductive: Normal size prostate. Other: No pneumoperitoneum, ascites or focal fluid collection. Musculoskeletal: No aggressive appearing focal osseous lesions. IMPRESSION: VASCULAR 1. Occluded distal left popliteal artery with zero vessel runoff to the left ankle. 2. 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 3. Two-vessel runoff to the right ankle, see comments. NON-VASCULAR Small hiatal hernia.  Otherwise no acute abnormality. These results were called by telephone at the time of interpretation on 11/12/2016 at 6:38 pm to Dr. Lynden Oxford, who verbally acknowledged these results. Electronically Signed   By: Delbert Phenix M.D.   On: 11/12/2016 18:44    Procedures Procedures (including critical care time)  Medications Ordered in ED Medications  0.9 %  sodium chloride infusion (not administered)  potassium chloride SA (K-DUR,KLOR-CON) CR tablet 20-40 mEq (not administered)  acetaminophen (TYLENOL) tablet 325-650 mg (not administered)    Or  acetaminophen (TYLENOL) suppository 325-650 mg (not administered)  docusate sodium (COLACE) capsule 100 mg (100 mg Oral Given 11/13/16 1009)  ondansetron (ZOFRAN) injection 4 mg (not administered)  alum & mag hydroxide-simeth (MAALOX/MYLANTA) 200-200-20 MG/5ML suspension 15-30 mL (30 mLs Oral Given 11/13/16 0227)  pantoprazole (PROTONIX) EC tablet 40 mg (40 mg Oral Given 11/13/16 0820)  labetalol (NORMODYNE,TRANDATE) injection 10 mg (not administered)  hydrALAZINE (APRESOLINE) injection  5 mg (not administered)  metoprolol tartrate (LOPRESSOR) injection 2-5 mg (not administered)  guaiFENesin-dextromethorphan (ROBITUSSIN DM) 100-10 MG/5ML syrup 15 mL (not administered)  phenol (CHLORASEPTIC) mouth spray 1 spray (1 spray Mouth/Throat Given 11/13/16 0227)  heparin ADULT infusion 100 units/mL (25000 units/233mL sodium chloride 0.45%) (600 Units/hr Intravenous New Bag/Given 11/13/16 0227)  dextrose 5 % and 0.45 % NaCl with KCl 20 mEq/L infusion ( Intravenous New Bag/Given 11/12/16 2101)  oxyCODONE-acetaminophen (PERCOCET/ROXICET) 5-325 MG per tablet 1-2 tablet (2 tablets Oral Given 11/13/16 0246)  morphine 2 MG/ML injection 2-5 mg (not administered)  oxyCODONE-acetaminophen (PERCOCET/ROXICET) 5-325 MG per tablet 1 tablet (1 tablet Oral Given 11/12/16 1327)  methocarbamol (ROBAXIN)  tablet 500 mg (500 mg Oral Given 11/12/16 1517)  heparin bolus via infusion 4,000 Units (4,000 Units Intravenous Bolus from Bag 11/12/16 1607)  iopamidol (ISOVUE-370) 76 % injection (100 mLs  Contrast Given 11/12/16 1715)  dextrose 5 % with cefUROXime (ZINACEF) ADS Med (  Override pull for Anesthesia 11/12/16 1836)  cefUROXime (ZINACEF) 1.5 g in dextrose 5 % 50 mL IVPB (1.5 g Intravenous New Bag/Given 11/13/16 1010)     Initial Impression / Assessment and Plan / ED Course  I have reviewed the triage vital signs and the nursing notes.  Pertinent labs & imaging results that were available during my care of the patient were reviewed by me and considered in my medical decision making (see chart for details).     56 year old male presenting with non-specific left leg pain as above. Exam reveals cold extremity of left leg below mid shin. Unable to palpate pulses on left PT and DP. Right pulses strong and leg warm. Doppler popliteal pulse left intact. Cannot find doppler pulse of left DP or PT.  Patient with smoking history. Concern for ischemia. Will start on heparin and consult vascular.   Dr. Arbie Cookey returned consult  call. He advised to make the patient NPO and get CTA of abdomen and pelvis with bilateral run off. Called radiology before ordering to insure correct order. Advised radiology the patient needs this ASAP and they agreed to take him within next 10 minutes. Patient advised he will likely go to OR and is in agreement with plan. CTA done and shows patient has occluded distal left popliteal artery with zero vessel run off to left ankle. Patient taken to OR.    CRITICAL CARE Performed by: Jacinto Halim   Total critical care time: 30 minutes  Critical care time was exclusive of separately billable procedures and treating other patients.  Critical care was necessary to treat or prevent imminent or life-threatening deterioration.  Critical care was time spent personally by me on the following activities: development of treatment plan with patient and/or surrogate as well as nursing, discussions with consultants, evaluation of patient's response to treatment, examination of patient, obtaining history from patient or surrogate, ordering and performing treatments and interventions, ordering and review of laboratory studies, ordering and review of radiographic studies, pulse oximetry and re-evaluation of patient's condition.   Final Clinical Impressions(s) / ED Diagnoses   Final diagnoses:  PAD (peripheral artery disease) (HCC)  Lower limb ischemia    New Prescriptions Current Discharge Medication List       Princella Pellegrini 11/13/16 1104    Gwyneth Sprout, MD 11/19/16 2029

## 2016-11-12 NOTE — ED Notes (Signed)
Patient transported to CT 

## 2016-11-12 NOTE — H&P (Signed)
[facility] [place]  Patient name: Daniel Glover MRN: [id number] DOB: 1960-09-17 Sex: male    HPI: Daniel Glover is a 56 y.o. male, who is seen in the emergency room with acute ischemia of his left leg. He reports that around 10 AM this morning he had sudden onset of pain and numbness and tingling in his left leg and pain in his thigh. He reports no similar symptoms and specifically has had no prior history of claudication in his right or left leg. No prior history of peripheral vascular occlusive disease or coronary artery disease. 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 At that time a hypercoagulable workup was negative. He had been maintained on Coumadin but this is been discontinued. Other prior history of thromboembolic event. He does smoke cigarettes.  Past Medical History:  Diagnosis Date  . 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 PE (pulmonary embolism)   . Pneumonia     Family History  Problem Relation Age of Onset  . Lupus Mother   . Bleeding Disorder Father   . Hypertension Brother     SOCIAL HISTORY: Social History   Social History  . Marital status: Single    Spouse name: N/A  . Number of children: N/A  . Years of education: N/A   Occupational History  . Not on file.   Social History Main Topics  . Smoking status: Current Every Day Smoker    Packs/day: 0.25    Years: 15.00    Types: Cigarettes  . Smokeless tobacco: Never Used  . Alcohol use Yes     Comment: occassionally  . Drug use: No  . Sexual activity: Not on file   Other Topics Concern  . Not on file   Social History Narrative  . No narrative on file    No Known Allergies  Current Facility-Administered Medications  Medication Dose Route Frequency Provider Last Rate Last Dose  . heparin ADULT infusion 100 units/mL (25000 units/274mL sodium chloride 0.45%)  1,300 Units/hr  Intravenous Continuous Gwyneth Sprout, MD 13 mL/hr at 11/12/16 1607 1,300 Units/hr at 11/12/16 1607   Current Outpatient Prescriptions  Medication Sig Dispense Refill  . Aspirin-Acetaminophen (GOODYS BODY PAIN PO) Take 1 Package by mouth 2 (two) times daily as needed (pain).    Marland Kitchen HYDROcodone-acetaminophen (NORCO/VICODIN) 5-325 MG tablet Take 2 tablets by mouth once. Obtained 2 tabs from his son to take    . traMADol (ULTRAM) 50 MG tablet Take 1 tablet (50 mg total) by mouth every 6 (six) hours as needed. 15 tablet 0    REVIEW OF SYSTEMS:   denotes positive finding,  denotes negative finding Cardiac  Comments:  Chest pain or chest pressure:    Shortness of breath upon exertion:    Short of breath when lying flat:    Irregular heart rhythm:        Vascular    Pain in calf, thigh, or hip brought on by ambulation:    Pain in feet at night that wakes you up from your sleep:     Blood clot in your veins:    Leg swelling:         Pulmonary    Oxygen at home:    Productive cough:     Wheezing:         Neurologic    Sudden weakness in arms or legs:     Sudden  numbness in arms or legs:     Sudden onset of difficulty speaking or slurred speech:    Temporary loss of vision in one eye:     Problems with dizziness:         Gastrointestinal    Blood in stool:     Vomited blood:         Genitourinary    Burning when urinating:     Blood in urine:        Psychiatric    Major depression:         Hematologic    Bleeding problems:    Problems with blood clotting too easily:        Skin    Rashes or ulcers:        Constitutional    Fever or chills:      PHYSICAL EXAM: Vitals:   11/12/16 1730 11/12/16 1731 11/12/16 1732 11/12/16 1745  BP: 124/79   140/77  Pulse:  62 63 (!) 53  Resp:  (!) 23  20  Temp:      TempSrc:      SpO2:  97%  100%  Weight:      Height:        GENERAL: The patient is a well-nourished male, in no acute distress. The vital signs are  documented above. CARDIOVASCULAR: 2+ radial pulses bilaterally. 2+ femoral pulses bilateral absent popliteal and distal pulses on the left. Normal popliteal and dorsalis pedis pulse on the right PULMONARY: There is good air exchange  ABDOMEN: Soft and non-tender  MUSCULOSKELETAL: There are no major deformities or cyanosis. NEUROLOGIC: He is able to move his left foot and toes. Has diminished sensation in his left foot. SKIN: There are no ulcers or rashes noted. PSYCHIATRIC: The patient has a normal affect.  DATA:  CT angiogram reveals subtotal occlusion of his mid thigh superficial femoral artery with reconstitution above this. He has complete occlusion of all tibial vessels below the knee.  MEDICAL ISSUES: Critical limb ischemia with non-viable foot with a level of flow that he has. Will take immediately to the operating room for femoral and popliteal thrombectomy. Discussed this at length with the patient's management he may also require bypass. His calf is soft so o do not feel that he will need fasciotomy. He understands that this is limb threatening if we cannot improve flow to his foot. Will obtain hematology consult during this admission due to his recurrent thromboembolic events. Also will obtain a 2-D echocardiogram   [name], MD [facility] Tel [phone] Pager [phone]

## 2016-11-12 NOTE — ED Notes (Signed)
Per CT, will transport pt for scan in the next 10 minutes.

## 2016-11-12 NOTE — CV Procedure (Signed)
    OPERATIVE REPORT  DATE OF SURGERY: 11/12/2016  PATIENT: Daniel Glover, 56 y.o. male MRN: [id number]  DOB: 16-May-1960  PRE-OPERATIVE DIAGNOSIS: Acute critical limb ischemia left lower extremity  POST-OPERATIVE DIAGNOSIS:  Same  PROCEDURE: Left superficial femoral, popliteal and tibial thrombectomy via below-knee popliteal artery  SURGEON:  Gretta Began, M.D.  PHYSICIAN ASSISTANT: Nurse  ANESTHESIA:  Gen.  EBL: 100 ml  Total I/O In: 1000 [I.V.:1000] Out: 95 [Urine:95]  BLOOD ADMINISTERED: None  DRAINS: None  SPECIMEN: Thrombus  COUNTS CORRECT:  YES  PLAN OF CARE: PACU with palpable dorsalis pedis pulse   PATIENT DISPOSITION:  PACU - hemodynamically stable  PROCEDURE DETAILS: Patient was taken up in place supervision area of the left groin left leg prepped in sterile fashion. Incision was made in the medial approach to the below-knee popliteal artery. The saphenous vein was identified and the same incision was mobilized to a protected. The fascia was opened in the gastrocnemius muscles reflux posteriorly. The popliteal artery was exposed and was filled with thrombus and there was no pulse. The popliteal artery was encircled with a vessel loop. Dissection was tented down to the anterior tibial takeoff and this also was encircled with a red Vesseloops. The tibioperoneal trunk was identified below this and it also was encircled. The patient had been receiving thousand per cc heparin. He was given additional 7000 unit IV bolus. After adequate circulation time the popliteal artery was opened transversely above the level of the anterior tibial artery takeoff. The superficial femoral and popliteal arteries were thrombectomized with a great deal of a relatively acute thrombus being removed and excellent inflow was obtained. This was reoccluded. Next a 3 Fogarty catheter was passed down the anterior tibial artery could be passed all the way to the foot. There was a thrombus removed at the  origin of the anterior tibial but did not appear to be thrombus in the main portion of the anterior tibial artery. This was reoccluded with Vesseloops. A 3 Fogarty was then passed down the tibioperoneal trunk and the organized clot was removed from this with good backbleeding. After no further positive passes this was reoccluded as well. The incision and the artery was closed with running 6-0 Prolene suture. Prior to completion of the closure the usual flushing maneuvers were undertaken. Anastomosis completed and clamps removed. There was a palpable anterior tibial pulse at the ankle. The IV heparin infusion was discontinued. The patient was not given any protamine to reverse the heparin. Wounds irrigated with saline and hemostasis was obtained left cautery. The wounds were closed with 2-0 Vicryl to reapproximate this fascia and a running fashion. The skin was closed with 3-0 subcuticular Vicryl suture. Sterile dressing was applied the patient was transferred to the recovery room stable condition   [name], M.D., [facility] 11/12/2016 8:05 PM

## 2016-11-12 NOTE — Anesthesia Procedure Notes (Signed)
Procedure Name: Intubation Date/Time: 11/12/2016 6:34 PM Performed by: Edmonia Caprio Pre-anesthesia Checklist: Patient identified, Emergency Drugs available, Suction available, Patient being monitored and Timeout performed Patient Re-evaluated:Patient Re-evaluated prior to induction Oxygen Delivery Method: Circle system utilized Preoxygenation: Pre-oxygenation with 100% oxygen Induction Type: IV induction, Rapid sequence and Cricoid Pressure applied Laryngoscope Size: Miller and 2 Grade View: Grade I Tube type: Oral Tube size: 7.5 mm Number of attempts: 1 Airway Equipment and Method: Stylet Placement Confirmation: ETT inserted through vocal cords under direct vision,  positive ETCO2 and breath sounds checked- equal and bilateral Secured at: 22 cm Tube secured with: Tape Dental Injury: Teeth and Oropharynx as per pre-operative assessment  Comments: Oropharynx clear on DL

## 2016-11-12 NOTE — ED Notes (Signed)
Patient transported to Ultrasound 

## 2016-11-12 NOTE — Transfer of Care (Signed)
Immediate Anesthesia Transfer of Care Note  Patient: Daniel Glover  Procedure(s) Performed: Procedure(s): THROMBECTOMY LEFT SUPERFICIAL FEMORAL ARTERY, LEFT POPLITEAL ARTERY AND LEFT TIBIAL ARTERY  (Left)  Patient Location: PACU  Anesthesia Type:General  Level of Consciousness: awake  Airway & Oxygen Therapy: Patient Spontanous Breathing and Patient connected to nasal cannula oxygen  Post-op Assessment: Report given to RN and Post -op Vital signs reviewed and stable  Post vital signs: Reviewed and stable  Last Vitals:  Vitals:   11/12/16 1732 11/12/16 1745  BP:  140/77  Pulse: 63 (!) 53  Resp:  20  Temp:    SpO2:  100%    Last Pain:  Vitals:   11/12/16 1521  TempSrc:   PainSc: 7          Complications: No apparent anesthesia complications

## 2016-11-12 NOTE — ED Notes (Signed)
Patient transported to X-ray 

## 2016-11-12 NOTE — ED Triage Notes (Signed)
Pt. Stated, I started having left leg and arm pain about a hour ago.  Ive had blood clots before.

## 2016-11-12 NOTE — ED Notes (Signed)
Per Dr. Anitra Lauth, pt not to be transported to [place] or XR at this time. Consult for vascular placed and heparin ordered based on pt with no pulses in LLE.

## 2016-11-13 ENCOUNTER — Encounter (HOSPITAL_COMMUNITY): Payer: Self-pay

## 2016-11-13 ENCOUNTER — Encounter (HOSPITAL_COMMUNITY): Payer: Self-pay | Admitting: Vascular Surgery

## 2016-11-13 ENCOUNTER — Inpatient Hospital Stay (HOSPITAL_COMMUNITY): Payer: Self-pay

## 2016-11-13 DIAGNOSIS — I82412 Acute embolism and thrombosis of left femoral vein: Secondary | ICD-10-CM

## 2016-11-13 DIAGNOSIS — I2699 Other pulmonary embolism without acute cor pulmonale: Secondary | ICD-10-CM

## 2016-11-13 DIAGNOSIS — I7389 Other specified peripheral vascular diseases: Secondary | ICD-10-CM

## 2016-11-13 LAB — CBC
HEMATOCRIT: 43.2 % (ref 39.0–52.0)
HEMOGLOBIN: 14.8 g/dL (ref 13.0–17.0)
MCH: 29.7 pg (ref 26.0–34.0)
MCHC: 34.3 g/dL (ref 30.0–36.0)
MCV: 86.6 fL (ref 78.0–100.0)
PLATELETS: 227 10*3/uL (ref 150–400)
RBC: 4.99 MIL/uL (ref 4.22–5.81)
RDW: 13.8 % (ref 11.5–15.5)
WBC: 13.2 10*3/uL — ABNORMAL HIGH (ref 4.0–10.5)

## 2016-11-13 LAB — BASIC METABOLIC PANEL
ANION GAP: 8 (ref 5–15)
BUN: 12 mg/dL (ref 6–20)
CHLORIDE: 105 mmol/L (ref 101–111)
CO2: 25 mmol/L (ref 22–32)
Calcium: 9.1 mg/dL (ref 8.9–10.3)
Creatinine, Ser: 1.23 mg/dL (ref 0.61–1.24)
GFR calc Af Amer: >60 mL/min (ref >60)
GFR calc non Af Amer: >60 mL/min (ref >60)
GLUCOSE: 137 mg/dL — AB (ref 65–99)
POTASSIUM: 4 mmol/L (ref 3.5–5.1)
Sodium: 138 mmol/L (ref 135–145)

## 2016-11-13 LAB — HEPARIN LEVEL (UNFRACTIONATED): Heparin Unfractionated: 0.19 IU/mL — ABNORMAL LOW (ref 0.30–0.70)

## 2016-11-13 LAB — APTT: aPTT: 31 seconds (ref 24–36)

## 2016-11-13 LAB — ECHOCARDIOGRAM COMPLETE
HEIGHTINCHES: 66 in
Weight: 2939.2 oz

## 2016-11-13 MED ORDER — HEPARIN (PORCINE) IN NACL 100-0.45 UNIT/ML-% IJ SOLN
1350.0000 [IU]/h | INTRAMUSCULAR | Status: DC
  Administered 2016-11-14 – 2016-11-15 (×2): 1350 [IU]/h via INTRAVENOUS
  Filled 2016-11-13 (×2): qty 250

## 2016-11-13 NOTE — Progress Notes (Signed)
ANTICOAGULATION CONSULT NOTE - Follow-up Consult  Pharmacy Consult for heparin Indication: DVT  No Known Allergies  Patient Measurements: Height:  (167.6 cm) Weight: 183 lb 11.2 oz (83.3 kg) IBW/kg (Calculated) : 63.8 Heparin Dosing Weight: 80.3  Vital Signs: Temp: 98 F (36.7 C) (09/17 1225) Temp Source: Axillary (09/17 1225) BP: 135/74 (09/17 1225)  Labs:  Recent Labs  11/12/16 1154 11/13/16 0036  HGB 15.2 14.8  HCT 44.9 43.2  PLT 237 227  CREATININE 1.28* 1.23    Estimated Creatinine Clearance: 69.5 mL/min (by C-G formula based on SCr of 1.23 mg/dL).   Medical History: Past Medical History:  Diagnosis Date  . 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 PE (pulmonary embolism)   . Pneumonia     Medications:  Scheduled:  . docusate sodium  100 mg Oral Daily  . pantoprazole  40 mg Oral Daily    Assessment: 56 yom s/p Left superficial femoral, popliteal and tibial thrombectomy via below-knee popliteal artery. CBC stable with AM labs. Patient was started on low dose heparin infusion this morning ~0200. Pharmacy has been asked to increase to full treatment dose without bolus. No bleeding reported by RN at this time.   Goal of Therapy:  Heparin level 0.3-0.7 units/ml Monitor platelets by anticoagulation protocol: Yes   Plan:  Increase heparin gtt to 1100 units/hr  Check anti-Xa level in 6 hours and daily while on heparin Monitor for s/sx of bleeding  Ruben Im, PharmD Clinical Pharmacist 11/13/2016 2:25 PM

## 2016-11-13 NOTE — Evaluation (Signed)
Occupational Therapy Evaluation Patient Details Name: Daniel Glover MRN: [id number] DOB: [birth year] Today's Date: 11/13/2016    History of Present Illness Daniel Glover is a 56 y.o. male admitted with acute ischemia of his left leg.  Now s/p Left superficial femoral, popliteal and tibial thrombectomy via below-knee popliteal artery.  PMH DVT/PE 2014.   Clinical Impression   Patient evaluated by Occupational Therapy with no further acute OT needs identified. All education has been completed and the patient has no further questions. Pt is able to perform ADLs at supervision level and perform functional transfers.  He should quickly progress to mod I.   See below for any follow-up Occupational Therapy or equipment needs. OT is signing off. Thank you for this referral.      Follow Up Recommendations  No OT follow up;Supervision - Intermittent    Equipment Recommendations  None recommended by OT    Recommendations for Other Services       Precautions / Restrictions Precautions Precautions: None      Mobility Bed Mobility Overal bed mobility: Independent                Transfers Overall transfer level: Modified independent Equipment used: None                  Balance Overall balance assessment: Needs assistance Sitting-balance support: Feet supported Sitting balance-Leahy Scale: Good     Standing balance support: During functional activity Standing balance-Leahy Scale: Good Standing balance comment: can stand unaided and move about (already up to "throw paper in trash can" in standing on his own                           ADL either performed or assessed with clinical judgement   ADL Overall ADL's : Needs assistance/impaired Eating/Feeding: Independent   Grooming: Wash/dry hands;Wash/dry face;Oral care;Brushing hair;Supervision/safety;Standing   Upper Body Bathing: Set up;Sitting;Standing   Lower Body Bathing: Supervison/ safety;Sit to/from  stand   Upper Body Dressing : Set up;Sitting   Lower Body Dressing: Supervision/safety;Sit to/from stand   Toilet Transfer: Supervision/safety;Ambulation;Comfort height toilet;Grab bars   Toileting- Clothing Manipulation and Hygiene: Supervision/safety;Sit to/from stand       Functional mobility during ADLs: Supervision/safety General ADL Comments: Pt requires supervision due to lines and pain      Vision         Perception     Praxis      Pertinent Vitals/Pain Pain Assessment: Faces Faces Pain Scale: Hurts little more Pain Location: left leg Pain Descriptors / Indicators: Sharp;Cramping Pain Intervention(s): Monitored during session     Hand Dominance Right   Extremity/Trunk Assessment Upper Extremity Assessment Upper Extremity Assessment: Overall WFL for tasks assessed   Lower Extremity Assessment Lower Extremity Assessment: Defer to PT evaluation LLE Deficits / Details: mildly limited due to antalgia/"tight calf" LLE Sensation: 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)   Cervical / Trunk Assessment Cervical / Trunk Assessment: Normal   Communication Communication Communication: No difficulties   Cognition Arousal/Alertness: Awake/alert Behavior During Therapy: WFL for tasks assessed/performed Overall Cognitive Status: Within Functional Limits for tasks assessed                                     General Comments  VSS    Exercises     Shoulder Instructions      Home Living Family/patient  expects to be discharged to:: Private residence Living Arrangements: Children Available Help at Discharge: Family Type of Home: House Home Access: Stairs to enter Secretary/administrator of Steps: 6 Entrance Stairs-Rails: Right;Left Home Layout: Two level;Bed/bath upstairs Alternate Level Stairs-Number of Steps: 12 Alternate Level Stairs-Rails: Left Bathroom Shower/Tub: Producer, television/film/video: Standard     Home  Equipment: None          Prior Functioning/Environment Level of Independence: Independent                 OT Problem List: Pain      OT Treatment/Interventions:      OT Goals(Current goals can be found in the care plan section) Acute Rehab OT Goals Patient Stated Goal: to get back to normal  OT Goal Formulation: All assessment and education complete, DC therapy  OT Frequency:     Barriers to D/C:            Co-evaluation              AM-PAC PT "6 Clicks" Daily Activity     Outcome Measure Help from another person eating meals?: None Help from another person taking care of personal grooming?: A Little Help from another person toileting, which includes using toliet, bedpan, or urinal?: A Little Help from another person bathing (including washing, rinsing, drying)?: A Little Help from another person to put on and taking off regular upper body clothing?: A Little Help from another person to put on and taking off regular lower body clothing?: A Little 6 Click Score: 19   End of Session Nurse Communication: Mobility status  Activity Tolerance: Patient tolerated treatment well Patient left: in bed;with call bell/phone within reach  OT Visit Diagnosis: Pain Pain - Right/Left: Left Pain - part of body: Leg                Time: 8657-8469 OT Time Calculation (min): 23 min Charges:  OT General Charges $OT Visit: 1 Visit OT Evaluation $OT Eval Low Complexity: 1 Low OT Treatments $Self Care/Home Management : 8-22 mins G-Codes:     [employer], OTR/L [phone]   Beverly Ferner M 11/13/2016, 2:01 PM

## 2016-11-13 NOTE — Care Management Note (Signed)
Case Management Note Donn Pierini RN, BSN Unit 4E-Case Manager [phone]  Patient Details  Name: Daniel Glover MRN: [id number] Date of Birth: [birth year]  Subjective/Objective:   Pt admitted with acute ischemia of  left leg  S/p thrombectomy              Action/Plan: PTA pt lived at home, needs PCP- will see pt regarding PCP needs prior to discharge.   Expected Discharge Date:                  Expected Discharge Plan:  Home/Self Care  In-House Referral:     Discharge planning Services  CM Consult, Other - See comment  Post Acute Care Choice:    Choice offered to:     DME Arranged:    DME Agency:     HH Arranged:    HH Agency:     Status of Service:  In process, will continue to follow  If discussed at Long Length of Stay Meetings, dates discussed:    Discharge Disposition:   Additional Comments:  Darrold Span, RN 11/13/2016, 3:43 PM

## 2016-11-13 NOTE — Progress Notes (Signed)
  Echocardiogram 2D Echocardiogram has been performed.  Daniel Glover 11/13/2016, 12:07 PM

## 2016-11-13 NOTE — Anesthesia Postprocedure Evaluation (Signed)
Anesthesia Post Note  Patient: Daniel Glover  Procedure(s) Performed: Procedure(s) (LRB): THROMBECTOMY LEFT SUPERFICIAL FEMORAL ARTERY, LEFT POPLITEAL ARTERY AND LEFT TIBIAL ARTERY  (Left)     Patient location during evaluation: PACU Anesthesia Type: General Level of consciousness: awake and alert Pain management: pain level controlled Vital Signs Assessment: post-procedure vital signs reviewed and stable Respiratory status: spontaneous breathing, nonlabored ventilation, respiratory function stable and patient connected to nasal cannula oxygen Cardiovascular status: blood pressure returned to baseline and stable Postop Assessment: no apparent nausea or vomiting Anesthetic complications: no    Last Vitals:  Vitals:   11/13/16 0400 11/13/16 0731  BP: 111/63 121/66  Pulse:    Resp: (!) 24 17  Temp: 37 C 36.5 C  SpO2: 99% 96%    Last Pain:  Vitals:   11/13/16 0731  TempSrc: Oral  PainSc:                  Eila Runyan

## 2016-11-13 NOTE — Consult Note (Signed)
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 Initial presenting symptom at that time was pleuritic chest pain and shortness of breath without associated lower extremity discomfort or swelling. Evaluation included bilateral lower extremity ultrasound revealing distal femoral and popliteal venous clot on the left side as well as CTA of the chest demonstrating bilateral heavy burden of o'clock including main pulmonary artery and complicated by pulmonary infarcts on the left side. Additionally, intra-atrial clot was noted by echocardiogram. Patient was initially treated with intravenous heparin followed by transition to warfarin on which he remained for 1-1.5 years. At that time, patient self discontinued the medication out of frustration with continued need for follow-up.  During the primary presentation, patient was evaluated by Dr. Pierce Crane who has conducted thrombophilia evaluation. Evaluation demonstrated no evidence of defined thrombotic disposition. In particular, patient tested negative for antiphospholipid antibody syndrome. Additionally, he is negative for factor V Leiden mutation. A panel of cancer markers was also negative including AFP, PSA, CA19-9, and CEA.  Patient was doing well in the intervening years without recurrent symptoms of chest pain, shortness of breath, or swelling of the extremities. He has had no history of TIA, stroke, myocardial infarction. His family history is significant for systemic lupus in his mother, his sister has had a renal transplant for an unclear  diagnosis.  The current presentation started yesterday at night when the patient has abruptly developed paresthesia in the left lower extremity followed by rapidly progressive severe pain reaching 10/10 severity. Patient was brought in the hospital for evaluation demonstrated a pulseless left lower extremity. CTA Lt LE of high-grade stenosis of the proximal left SFA as well as the distal artery with 0 residual flow to the left ankle. Patient underwent emergency left superficial femoral, popliteal, tibial thrombectomy with rapid resolution of symptoms and restoration of palpable pulses after the procedure.  At the present time, patient denies pain anywhere. He reports normal feeling in his lower extremity. Denies abdominal pain, constipation, but does have some indigestion since the procedure. Denies chest pain, shortness of breath, or cough. No headaches. Sedation strength in the extremities.      Past Medical History:  Diagnosis Date  . 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 PE (pulmonary embolism)   . Pneumonia   :  Past Surgical History:  Procedure Laterality Date  . APPENDECTOMY    . arm surgery    . THROMBECTOMY FEMORAL ARTERY Left 11/12/2016   Procedure: THROMBECTOMY LEFT SUPERFICIAL FEMORAL ARTERY, LEFT POPLITEAL ARTERY AND LEFT TIBIAL ARTERY ;  Surgeon: [name], MD;  Location: [facility] OR;  Service: Vascular;  Laterality: Left;  . TONSILLECTOMY    :   Current Facility-Administered Medications:  .  0.9 %  sodium chloride infusion, 500 mL, Intravenous, Once PRN, Early, Kristen Loader, MD .  acetaminophen (TYLENOL) tablet 325-650 mg, 325-650 mg, Oral, Q4H PRN **OR** acetaminophen (TYLENOL) suppository 325-650 mg, 325-650 mg, Rectal, Q4H PRN, Early, Kristen Loader, MD .  alum & mag hydroxide-simeth (MAALOX/MYLANTA) 200-200-20 MG/5ML suspension 15-30 mL, 15-30 mL, Oral, Q2H PRN, Early, Kristen Loader, MD, 30 mL  at 11/13/16 1539 .  dextrose 5 % and 0.45 % NaCl with KCl  20 mEq/L infusion, , Intravenous, Continuous, Early, Kristen Loader, MD, Last Rate: 75 mL/hr at 11/12/16 2101 .  docusate sodium (COLACE) capsule 100 mg, 100 mg, Oral, Daily, Early, Kristen Loader, MD, 100 mg at 11/13/16 1009 .  guaiFENesin-dextromethorphan (ROBITUSSIN DM) 100-10 MG/5ML syrup 15 mL, 15 mL, Oral, Q4H PRN, Early, Kristen Loader, MD .  heparin ADULT infusion 100 units/mL (25000 units/266mL sodium chloride 0.45%), 1,100 Units/hr, Intravenous, Continuous, Early, Kristen Loader, MD, Last Rate: 11 mL/hr at 11/13/16 1434, 1,100 Units/hr at 11/13/16 1434 .  hydrALAZINE (APRESOLINE) injection 5 mg, 5 mg, Intravenous, Q20 Min PRN, Early, Kristen Loader, MD .  labetalol (NORMODYNE,TRANDATE) injection 10 mg, 10 mg, Intravenous, Q10 min PRN, Early, Kristen Loader, MD .  metoprolol tartrate (LOPRESSOR) injection 2-5 mg, 2-5 mg, Intravenous, Q2H PRN, Early, Kristen Loader, MD .  morphine 2 MG/ML injection 2-5 mg, 2-5 mg, Intravenous, Q1H PRN, Early, Kristen Loader, MD .  ondansetron (ZOFRAN) injection 4 mg, 4 mg, Intravenous, Q6H PRN, Early, Kristen Loader, MD .  oxyCODONE-acetaminophen (PERCOCET/ROXICET) 5-325 MG per tablet 1-2 tablet, 1-2 tablet, Oral, Q4H PRN, Early, Kristen Loader, MD, 2 tablet at 11/13/16 0246 .  pantoprazole (PROTONIX) EC tablet 40 mg, 40 mg, Oral, Daily, Early, Kristen Loader, MD, 40 mg at 11/13/16 0820 .  phenol (CHLORASEPTIC) mouth spray 1 spray, 1 spray, Mouth/Throat, PRN, Early, Kristen Loader, MD, 1 spray at 11/13/16 0227 .  potassium chloride SA (K-DUR,KLOR-CON) CR tablet 20-40 mEq, 20-40 mEq, Oral, Once PRN, Early, Kristen Loader, MD:  . docusate sodium  100 mg Oral Daily  . pantoprazole  40 mg Oral Daily  :  No Known Allergies:   Family History  Problem Relation Age of Onset  . Lupus Mother   . Bleeding Disorder Father   . Hypertension Brother      Social History   Social History  . Marital status: Single    Spouse name: N/A  . Number of children: N/A  . Years of education: N/A   Occupational History  . Not on file.   Social History Main Topics   . Smoking status: Current Every Day Smoker    Packs/day: 0.25    Years: 15.00    Types: Cigarettes  . Smokeless tobacco: Never Used  . Alcohol use Yes     Comment: occassionally  . Drug use: No  . Sexual activity: Not on file   Other Topics Concern  . Not on file   Social History Narrative  . No narrative on file    Review of Systems: As per history of present illness. All of systems is negative   Physical Exam:  Blood pressure 127/70, pulse 61, temperature 98.3 F (36.8 C), temperature source Oral, resp. rate (!) 22, height  (1.676 m), weight 183 lb 11.2 oz (83.3 kg), SpO2 96 %.  HEENT: Anicteric sclerae, PERRLA, EOMI Lungs: Clear to auscultation bilaterally without expiratory wheezing, dullness to percussion, or pleural rub. Cardiac: S1/S2, regular, no murmurs, rubs, gallops. Abdomen: Appears to be mildly distended, soft, nontender. No palpable hepatosplenomegaly Vascular: No peripheral edema. Bilateral lower extremities have warm feet, palpable dorsalis pedis and posterior tibialis pulses. Lymph nodes: No cervical, supraclavicular, axillary, or inguinal lymphadenopathy bilaterally Neurologic: No gross focal neurological deficits. Tactile sensation is intact in all 4 extremities Skin: No rashes, petechia, or ecchymosis Musculoskeletal: Unremarkable  LABS:   Recent Labs  11/12/16 1154 11/13/16 0036  WBC 8.3 13.2*  HGB 15.2 14.8  HCT 44.9 43.2  PLT 237 227     Recent Labs  11/12/16 1154 11/13/16 0036  NA 140 138  K 3.6 4.0  CL 105 105  CO2 27 25  GLUCOSE 105* 137*  BUN 15 12  CREATININE 1.28* 1.23  CALCIUM 9.1 9.1      RADIOLOGY:  Dg Ankle Complete Left  Result Date: 11/12/2016 CLINICAL DATA:  Acute left ankle pain.  Initial encounter. EXAM: LEFT ANKLE COMPLETE - 3+ VIEW COMPARISON:  None. FINDINGS: There is no evidence of fracture, dislocation, or joint effusion. There is no evidence of arthropathy or other focal bone abnormality. Soft  tissues are unremarkable. IMPRESSION: Negative. Electronically Signed   By: Harmon Pier M.D.   On: 11/12/2016 16:11   Ct Angio Ao+bifem W & Or Wo Contrast  Result Date: 11/12/2016 CLINICAL DATA:  Pulseless left foot.  Left lower extremity pain. EXAM: CT ANGIOGRAPHY OF ABDOMINAL AORTA WITH ILIOFEMORAL RUNOFF TECHNIQUE: Multidetector CT imaging of the abdomen, pelvis and lower extremities was performed using the standard protocol during bolus administration of intravenous contrast. Multiplanar CT image reconstructions and MIPs were obtained to evaluate the vascular anatomy. CONTRAST:  100 cc Isovue 370 IV. COMPARISON:  01/12/2012 CT abdomen/pelvis. FINDINGS: VASCULAR Aorta: Normal caliber abdominal aorta without aneurysm, dissection, vasculitis or significant stenosis. Celiac: Patent without evidence of aneurysm, dissection, vasculitis or significant stenosis. SMA: Patent without evidence of aneurysm, dissection, vasculitis or significant stenosis. Renals: Single renal arteries bilaterally. Both renal arteries are patent without evidence of aneurysm, dissection, vasculitis, fibromuscular dysplasia or significant stenosis. IMA: Patent without evidence of aneurysm, dissection, vasculitis or significant stenosis. RIGHT Lower Extremity Inflow: Common, internal and external iliac arteries are patent without evidence of aneurysm, dissection, vasculitis or significant stenosis. Outflow: Common, superficial and profunda femoral arteries and the popliteal artery are patent without evidence of aneurysm, dissection, vasculitis or significant stenosis. Runoff: Patent two vessel (peroneal and posterior tibial) runoff to the ankle, noting patent dorsalis pedis artery arising from the peroneal artery. Anterior tibial artery is patent proximally and becomes diminutive at the level of the mid to distal tibia. LEFT Lower Extremity Inflow: Common, internal and external iliac arteries are patent without evidence of aneurysm,  dissection, vasculitis or significant stenosis. Outflow: Patent common iliac artery. Proximal occlusion of the profunda femoral artery (Series 5/images 204-206). High-grade stenosis of the superficial femoral artery proximally (series 5/image 225). Distal occlusion of the popliteal artery (series 5/images 389-391). Runoff: Zero vessel runoff to the left ankle. Anterior tibial artery, posterior tibial artery and peroneal arteries are occluded. Veins: No obvious venous abnormality within the limitations of this arterial phase study. Review of the MIP images confirms the above findings. NON-VASCULAR Lower chest: Small air cyst in the basilar right lower lobe is stable since 01/12/2012 and nonspecific. No acute abnormality at the lung bases. Hepatobiliary: Normal liver size. Subcentimeter hypodense left liver lobe lesion is too small to characterize and stable, considered benign. No new liver lesions. Normal gallbladder, with no radiopaque cholelithiasis. No biliary ductal dilatation. Pancreas: Normal, with no mass or duct dilation. Spleen: Normal size. No mass. Adrenals/Urinary Tract: Normal adrenals. No hydronephrosis. Subcentimeter hypodense renal cortical lesion in the lateral upper left kidney, too small to characterize, for which no follow-up is required. No additional contour deforming renal lesions. Normal bladder. Stomach/Bowel: Small hiatal hernia. Otherwise grossly normal stomach. Normal caliber small bowel with no small bowel wall thickening. Appendectomy. Normal large bowel with no diverticulosis, large bowel wall thickening or pericolonic  fat stranding. Vascular/Lymphatic: Nonaneurysmal abdominal aorta. No pathologically enlarged lymph nodes in the abdomen or pelvis. Reproductive: Normal size prostate. Other: No pneumoperitoneum, ascites or focal fluid collection. Musculoskeletal: No aggressive appearing focal osseous lesions. IMPRESSION: VASCULAR 1. Occluded distal left popliteal artery with zero vessel  runoff to the left ankle. 2. 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 3. Two-vessel runoff to the right ankle, see comments. NON-VASCULAR Small hiatal hernia.  Otherwise no acute abnormality. These results were called by telephone at the time of interpretation on 11/12/2016 at 6:38 pm to Dr. Lynden Oxford, who verbally acknowledged these results. Electronically Signed   By: Delbert Phenix M.D.   On: 11/12/2016 18:44    Assessment and Plan:  56 year old male with previous history of severe venous thromboembolic disease thousand 13 without provoking factors established. At that time, patient had severe clots in the lungs, intra-atrial thrombus, and pulmonary infarct in addition to significant left deep vein thrombosis which was asymptomatic. No defined thrombophilia was identified at that time, anticoagulation with warfarin was discontinued after 1.5 years of treatment.  Now, patient has arterial thrombosis or thromboembolism which represents a significantly different than VTE. Angiogram also demonstrates stenosis of the proximal SFA. Patient has been successfully revascularized and currently is receiving heparin drip.  Recommendations: --Consider CTA Chest/Abdomen and pelvis to eval for potential other threatened vascular beds. --Continue Heprin gtt for now at the discretion fo Dr Arbie Cookey. --When considering discharge home, transition to rivaroxaban  PO BID x21 days, then  PO Daily  or apixaban 10 mg orally twice daily for the first 7 days of therapy; after 7 days, 5 mg orally twice daily  --Due to the arterial nature of the clot, recommend addition of a full-dose antiplatelet such as aspirin or clopidogrel, but not dual antiplatelets as, when coupled with an anticoagulant, that may cause an unacceptable bleeding risk. --At this time, I do not recommend any additional lab testing as the results are likely to be affected by the the recent thrombosis and thrombectomy --I will f/u with the  patient 2-4 weeks after discharge to continue monitoring and adjusting therapy  Than you for involving me in the care of this interesting case. I will update my recommendations if new information becomes available.   Daisy Blossom, MD 11/13/2016, 5:28 PM

## 2016-11-13 NOTE — Evaluation (Signed)
Physical Therapy Evaluation Patient Details Name: Daniel Glover MRN: [id number] DOB: [birth year] Today's Date: 11/13/2016   History of Present Illness  Braun Rocca is a 56 y.o. male admitted with acute ischemia of his left leg.  Now s/p Left superficial femoral, popliteal and tibial thrombectomy via below-knee popliteal artery.  PMH DVT/PE 2014.  Clinical Impression  Patient presents with decreased mobility due to pain and weakness L LE.  He will benefit from skilled PT in the acute setting to allow return home with family support.   Likely not to need follow up PT.     Follow Up Recommendations No PT follow up    Equipment Recommendations  Other (comment) (TBA ? w/c)    Recommendations for Other Services       Precautions / Restrictions Precautions Precautions: None      Mobility  Bed Mobility Overal bed mobility: Modified Independent                Transfers Overall transfer level: Modified independent Equipment used: None                Ambulation/Gait Ambulation/Gait assistance: Supervision Ambulation Distance (Feet): 300 Feet Assistive device: Rolling walker (2 wheeled) Gait Pattern/deviations: Antalgic     General Gait Details: utilizing walker with front wheels only due to back legs dragging and pt prefers to pick them up; mild antalgia on L   Stairs            Wheelchair Mobility    Modified Rankin (Stroke Patients Only)       Balance Overall balance assessment: Needs assistance Sitting-balance support: No upper extremity supported Sitting balance-Leahy Scale: Good       Standing balance-Leahy Scale: Good Standing balance comment: can stand unaided and move about (already up to "throw paper in trash can" in standing on his own                             Pertinent Vitals/Pain Pain Assessment: 0-10 Pain Score: 5  Pain Location: left leg Pain Descriptors / Indicators: Sharp;Cramping Pain Intervention(s): Monitored  during session;Repositioned    Home Living Family/patient expects to be discharged to:: Private residence Living Arrangements: Children (son stays off and on) Available Help at Discharge: Family Type of Home: House Home Access: Stairs to enter Entrance Stairs-Rails: Doctor, general practice of Steps: 6 Home Layout: Two level;Bed/bath upstairs Home Equipment: None      Prior Function Level of Independence: Independent               Hand Dominance   Dominant Hand: Right    Extremity/Trunk Assessment   Upper Extremity Assessment Upper Extremity Assessment: Overall WFL for tasks assessed    Lower Extremity Assessment Lower Extremity Assessment: LLE deficits/detail LLE Deficits / Details: mildly limited due to antalgia/"tight calf" LLE Sensation: 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)       Communication   Communication: No difficulties  Cognition Arousal/Alertness: Awake/alert Behavior During Therapy: WFL for tasks assessed/performed Overall Cognitive Status: Within Functional Limits for tasks assessed                                        General Comments      Exercises     Assessment/Plan    PT Assessment Patient needs continued PT services  PT Problem List Decreased  mobility;Pain;Decreased knowledge of use of DME       PT Treatment Interventions Therapeutic exercise;Patient/family education;Gait training;Stair training;Balance training;Functional mobility training;DME instruction    PT Goals (Current goals can be found in the Care Plan section)  Acute Rehab PT Goals Patient Stated Goal: To return home PT Goal Formulation: With patient Time For Goal Achievement: 11/16/16 Potential to Achieve Goals: Good    Frequency Min 3X/week   Barriers to discharge        Co-evaluation               AM-PAC PT "6 Clicks" Daily Activity  Outcome Measure Difficulty turning over in bed (including adjusting  bedclothes, sheets and blankets)?: None Difficulty moving from lying on back to sitting on the side of the bed? : None Difficulty sitting down on and standing up from a chair with arms (e.g., wheelchair, bedside commode, etc,.)?: None Help needed moving to and from a bed to chair (including a wheelchair)?: A Little Help needed walking in hospital room?: A Little Help needed climbing 3-5 steps with a railing? : A Little 6 Click Score: 21    End of Session Equipment Utilized During Treatment: Gait belt Activity Tolerance: Patient tolerated treatment well Patient left: in chair;with call bell/phone within reach   PT Visit Diagnosis: Difficulty in walking, not elsewhere classified (R26.2);Pain Pain - Right/Left: Left Pain - part of body: Leg    Time: 1610-9604 PT Time Calculation (min) (ACUTE ONLY): 26 min   Charges:   PT Evaluation $PT Eval Moderate Complexity: 1 Mod PT Treatments $Gait Training: 8-22 mins   PT G CodesSheran Lawless,  [phone] 11/13/2016   Elray Mcgregor 11/13/2016, 10:19 AM

## 2016-11-13 NOTE — Progress Notes (Signed)
ANTICOAGULATION CONSULT NOTE - Follow-up Consult  Pharmacy Consult for heparin Indication: DVT LLE  No Known Allergies  Patient Measurements: Height:  (167.6 cm) Weight: 183 lb 11.2 oz (83.3 kg) IBW/kg (Calculated) : 63.8 Heparin Dosing Weight: 80.3  Vital Signs: Temp: 98.6 F (37 C) (09/17 2000) Temp Source: Oral (09/17 2000) BP: 127/75 (09/17 2000)  Labs:  Recent Labs  11/12/16 1154 11/13/16 0036 11/13/16 1442 11/13/16 2104  HGB 15.2 14.8  --   --   HCT 44.9 43.2  --   --   PLT 237 227  --   --   APTT  --   --  31  --   HEPARINUNFRC  --   --   --  0.19*  CREATININE 1.28* 1.23  --   --     Estimated Creatinine Clearance: 69.5 mL/min (by C-G formula based on SCr of 1.23 mg/dL).   Medical History: Past Medical History:  Diagnosis Date  . 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 PE (pulmonary embolism)   . Pneumonia     Medications:  Scheduled:  . docusate sodium  100 mg Oral Daily  . pantoprazole  40 mg Oral Daily    Assessment: 56 yom s/p Left superficial femoral, popliteal and tibial thrombectomy via below-knee popliteal artery. CBC stable with AM labs. Patient was started on low dose heparin infusion this morning 11/13/16 ~0200. Pharmacy was consulted on 11/13/16 to increase to full treatment dose without bolus.  Hematology consult done today, noting patient has a h/o severe venous thromboembolic disease in 2013 without provoking factors established; he had severe clots in lungs, intra-atrial thrombus,pulmonary infarct, and LLE DVT. He was treated with warfarin for 1.5 years.   The 6 hour Heparin level = 0.19 on heparin drip 1100 units/hr =13.7 untis/kg/hr). Subtherapeutic level. No bleeding noted. No complications with IV heparin infusion per RN and no bleeding per RN.    Goal of Therapy:  Heparin level 0.3-0.7 units/ml Monitor platelets by anticoagulation protocol: Yes   Plan:  Increase heparin gtt to 1350  units/hr  Check anti-Xa level in 6 hours and daily while on heparin Monitor for s/sx of bleeding  Noah Delaine, RPh Clinical Pharmacist Pager: [phone] 11/13/2016 9:44 PM
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
## 2016-11-14 ENCOUNTER — Inpatient Hospital Stay (HOSPITAL_COMMUNITY): Payer: Self-pay

## 2016-11-14 LAB — CBC
HEMATOCRIT: 38.2 % — AB (ref 39.0–52.0)
Hemoglobin: 12.9 g/dL — ABNORMAL LOW (ref 13.0–17.0)
MCH: 29.2 pg (ref 26.0–34.0)
MCHC: 33.8 g/dL (ref 30.0–36.0)
MCV: 86.4 fL (ref 78.0–100.0)
Platelets: 217 10*3/uL (ref 150–400)
RBC: 4.42 MIL/uL (ref 4.22–5.81)
RDW: 14.1 % (ref 11.5–15.5)
WBC: 14.3 10*3/uL — AB (ref 4.0–10.5)

## 2016-11-14 LAB — HEPARIN LEVEL (UNFRACTIONATED)
Heparin Unfractionated: 0.45 IU/mL (ref 0.30–0.70)
Heparin Unfractionated: 0.61 IU/mL (ref 0.30–0.70)

## 2016-11-14 LAB — APTT: aPTT: 62 seconds — ABNORMAL HIGH (ref 24–36)

## 2016-11-14 MED ORDER — IOPAMIDOL (ISOVUE-370) INJECTION 76%
INTRAVENOUS | Status: AC
  Filled 2016-11-14: qty 50

## 2016-11-14 MED ORDER — IOPAMIDOL (ISOVUE-370) INJECTION 76%
INTRAVENOUS | Status: AC
  Administered 2016-11-14: 100 mL
  Filled 2016-11-14: qty 100

## 2016-11-14 NOTE — Progress Notes (Signed)
ANTICOAGULATION CONSULT NOTE - Follow-up Consult  Pharmacy Consult for heparin Indication: DVT  No Known Allergies  Patient Measurements: Height:  (167.6 cm) Weight: 183 lb 11.2 oz (83.3 kg) IBW/kg (Calculated) : 63.8 Heparin Dosing Weight: 80.3  Vital Signs: Temp: 98.5 F (36.9 C) (09/18 0800) Temp Source: Oral (09/18 0800) BP: 107/60 (09/18 0800)  Labs:  Recent Labs  11/12/16 1154 11/13/16 0036 11/13/16 1442 11/13/16 2104 11/14/16 0428  HGB 15.2 14.8  --   --  12.9*  HCT 44.9 43.2  --   --  38.2*  PLT 237 227  --   --  217  APTT  --   --  31  --  62*  HEPARINUNFRC  --   --   --  0.19* 0.45  CREATININE 1.28* 1.23  --   --   --     Estimated Creatinine Clearance: 69.5 mL/min (by C-G formula based on SCr of 1.23 mg/dL).   Medical History: Past Medical History:  Diagnosis Date  . 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 PE (pulmonary embolism)   . Pneumonia     Medications:  Scheduled:  . docusate sodium  100 mg Oral Daily  . pantoprazole  40 mg Oral Daily    Assessment: 56 yom s/p Left superficial femoral, popliteal and tibial thrombectomy via below-knee popliteal artery. CBC stable with AM labs. Heparin level therapeutic 0.45 after rate increase yesterday evening. Will order confirmatory level await recommendations to transition to Eliquis or Xarelto.  Goal of Therapy:  Heparin level 0.3-0.7 units/ml Monitor platelets by anticoagulation protocol: Yes   Plan:  Continue heparin gtt at 1350 units/hr  Check anti-Xa level in 6 hours and daily while on heparin Monitor for s/sx of bleeding  Ruben Im, PharmD Clinical Pharmacist 11/14/2016 10:25 AM

## 2016-11-14 NOTE — Progress Notes (Signed)
Physical Therapy Treatment Patient Details Name: Daniel Glover MRN: [id number] DOB: 09-15-1960 Today's Date: 11/14/2016    History of Present Illness Kaynan Klonowski is a 56 y.o. male admitted with acute ischemia of his left leg.  Now s/p Left superficial femoral, popliteal and tibial thrombectomy via below-knee popliteal artery.  PMH DVT/PE 2014.    PT Comments    Patient is progressing well toward mobility goals and able to safely negotiate stairs this session. Continue to progress as tolerated.    Follow Up Recommendations  No PT follow up     Equipment Recommendations  None recommended by PT    Recommendations for Other Services       Precautions / Restrictions Precautions Precautions: None    Mobility  Bed Mobility Overal bed mobility: Independent                Transfers Overall transfer level: Modified independent Equipment used: None                Ambulation/Gait Ambulation/Gait assistance: Supervision Ambulation Distance (Feet): 300 Feet Assistive device: None Gait Pattern/deviations: Decreased stance time - left;Decreased stride length;Step-through pattern     General Gait Details: cues for posture/forward gaze; pt with steady gait and mildly antalgic intially but improving   Stairs Stairs: Yes   Stair Management: One rail Left;Step to pattern;Forwards Number of Stairs:  (4 steps X2) General stair comments: limited by IV line; cues for sequencing and technique  Wheelchair Mobility    Modified Rankin (Stroke Patients Only)       Balance Overall balance assessment: Needs assistance Sitting-balance support: Feet supported Sitting balance-Leahy Scale: Good     Standing balance support: During functional activity Standing balance-Leahy Scale: Good                              Cognition Arousal/Alertness: Awake/alert Behavior During Therapy: WFL for tasks assessed/performed Overall Cognitive Status: Within Functional  Limits for tasks assessed                                        Exercises Other Exercises Other Exercises: calf stretches 5 X30 secs each    General Comments        Pertinent Vitals/Pain Pain Assessment: Faces Faces Pain Scale: Hurts little more Pain Location: left leg with extension Pain Descriptors / Indicators: Sharp;Grimacing;Guarding Pain Intervention(s): Monitored during session;Repositioned    Home Living                      Prior Function            PT Goals (current goals can now be found in the care plan section) Acute Rehab PT Goals Patient Stated Goal: to get back to normal  PT Goal Formulation: With patient Time For Goal Achievement: 11/16/16 Potential to Achieve Goals: Good Progress towards PT goals: Progressing toward goals    Frequency    Min 3X/week      PT Plan Current plan remains appropriate    Co-evaluation              AM-PAC PT "6 Clicks" Daily Activity  Outcome Measure  Difficulty turning over in bed (including adjusting bedclothes, sheets and blankets)?: None Difficulty moving from lying on back to sitting on the side of the bed? : None Difficulty sitting down on and  standing up from a chair with arms (e.g., wheelchair, bedside commode, etc,.)?: None Help needed moving to and from a bed to chair (including a wheelchair)?: None Help needed walking in hospital room?: A Little Help needed climbing 3-5 steps with a railing? : A Little 6 Click Score: 22    End of Session Equipment Utilized During Treatment: Gait belt Activity Tolerance: Patient tolerated treatment well Patient left: with call bell/phone within reach;in bed Nurse Communication: Mobility status PT Visit Diagnosis: Difficulty in walking, not elsewhere classified (R26.2);Pain Pain - Right/Left: Left Pain - part of body: Leg     Time: 1440-1515 PT Time Calculation (min) (ACUTE ONLY): 35 min  Charges:  $Gait Training: 8-22  mins $Therapeutic Activity: 8-22 mins                    G Codes:       Erline Levine, PTA Pager: [phone]     Carolynne Edouard 11/14/2016, 3:43 PM

## 2016-11-14 NOTE — Progress Notes (Signed)
ANTICOAGULATION CONSULT NOTE - Follow-up Consult  Pharmacy Consult for heparin Indication: DVT  No Known Allergies  Patient Measurements: Height:  (167.6 cm) Weight: 183 lb 11.2 oz (83.3 kg) IBW/kg (Calculated) : 63.8 Heparin Dosing Weight: 80.3  Vital Signs: Temp: 98.2 F (36.8 C) (09/18 1209) Temp Source: Oral (09/18 1209) BP: 120/72 (09/18 1209)  Labs:  Recent Labs  11/12/16 1154 11/13/16 0036 11/13/16 1442 11/13/16 2104 11/14/16 0428 11/14/16 1122  HGB 15.2 14.8  --   --  12.9*  --   HCT 44.9 43.2  --   --  38.2*  --   PLT 237 227  --   --  217  --   APTT  --   --  31  --  62*  --   HEPARINUNFRC  --   --   --  0.19* 0.45 0.61  CREATININE 1.28* 1.23  --   --   --   --     Estimated Creatinine Clearance: 69.5 mL/min (by C-G formula based on SCr of 1.23 mg/dL).   Medical History: Past Medical History:  Diagnosis Date  . 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 PE (pulmonary embolism)   . Pneumonia     Medications:  Scheduled:  . docusate sodium  100 mg Oral Daily  . pantoprazole  40 mg Oral Daily    Assessment: 56 yom s/p Left superficial femoral, popliteal and tibial thrombectomy via below-knee popliteal artery. CBC stable with AM labs. Confirmatory heparin level therapeutic 0.6. Await recommendations to transition to Eliquis or Xarelto.  Goal of Therapy:  Heparin level 0.3-0.7 units/ml Monitor platelets by anticoagulation protocol: Yes   Plan:  Continue heparin gtt at 1350 units/hr  Check anti-Xa level daily while on heparin Monitor for s/sx of bleeding  Ruben Im, PharmD Clinical Pharmacist 11/14/2016 2:27 PM

## 2016-11-14 NOTE — Progress Notes (Signed)
PT Cancellation Note  Patient Details Name: Nevyn Bossman MRN: [id number] DOB: [birth year]   Cancelled Treatment:    Reason Eval/Treat Not Completed: Patient at procedure or test/unavailable PT will check on pt later as time allows.    Derek Mound, PTA Pager: [phone]   11/14/2016, 11:51 AM

## 2016-11-14 NOTE — Progress Notes (Signed)
  Progress Note    11/14/2016 7:24 AM 2 Days Post-Op  Subjective:  Says he needed some pain meds this am-otherwise went > 24 hrs without.  Walked in the hallways.  Afebrile HR  60's-70 NSR/SB 110's-120's systolic 96% RA  Vitals:   11/14/16 0435 11/14/16 0440  BP:  113/73  Pulse:    Resp:  19  Temp: 98 F (36.7 C)   SpO2:      Physical Exam: Cardiac:  regular Lungs:  Non labored Incisions:   Healing nicely Extremities:  Easily palpable left DP/PT pulses; calf is soft and non tender to palpation   CBC    Component Value Date/Time   WBC 14.3 (H) 11/14/2016 0428   RBC 4.42 11/14/2016 0428   HGB 12.9 (L) 11/14/2016 0428   HGB 15.1 07/10/2012 1255   HCT 38.2 (L) 11/14/2016 0428   HCT 43.9 07/10/2012 1255   PLT 217 11/14/2016 0428   PLT 209 07/10/2012 1255   MCV 86.4 11/14/2016 0428   MCV 88.7 07/10/2012 1255   MCH 29.2 11/14/2016 0428   MCHC 33.8 11/14/2016 0428   RDW 14.1 11/14/2016 0428   RDW 13.9 07/10/2012 1255   LYMPHSABS 2.1 07/10/2012 1255   MONOABS 0.8 07/10/2012 1255   EOSABS 0.2 07/10/2012 1255   BASOSABS 0.0 07/10/2012 1255    BMET    Component Value Date/Time   NA 138 11/13/2016 0036   NA 136 07/10/2012 1255   K 4.0 11/13/2016 0036   K 3.7 07/10/2012 1255   CL 105 11/13/2016 0036   CL 103 07/10/2012 1255   CO2 25 11/13/2016 0036   CO2 26 07/10/2012 1255   GLUCOSE 137 (H) 11/13/2016 0036   GLUCOSE 95 07/10/2012 1255   BUN 12 11/13/2016 0036   BUN 11.0 07/10/2012 1255   CREATININE 1.23 11/13/2016 0036   CREATININE 1.1 07/10/2012 1255   CALCIUM 9.1 11/13/2016 0036   CALCIUM 8.8 07/10/2012 1255   GFRNONAA >60 11/13/2016 0036   GFRAA >60 11/13/2016 0036    INR    Component Value Date/Time   INR 4.00 (H) 10/02/2012 1416     Intake/Output Summary (Last 24 hours) at 11/14/16 0724 Last data filed at 11/14/16 0600  Gross per 24 hour  Intake          3633.77 ml  Output             2870 ml  Net           763.77 ml     Assessment:   56 y.o. male is s/p:  Left superficial femoral, popliteal and tibial thrombectomy via below-knee popliteal artery   2 Days Post-Op  Plan: -pt doing well this am with easily palpable left DP/PT pulses.  Foot is baseline.  -mobilized yesterday and did well -says he didn't take pain meds for over 24 hrs but needed one this am.  Discussed with pt that the pain medication is there for him if he needs it and should take it if he needs it to not get behind.   -DVT prophylaxis:  Heparin gtt-Hem/onc recommends Xarelto or apixaban   Doreatha Massed, PA-C [facility] [phone] 11/14/2016 7:24 AM

## 2016-11-14 NOTE — Care Management Note (Signed)
Case Management Note Donn Pierini RN, BSN Unit 4E-Case Manager [phone]  Patient Details  Name: Daniel Glover MRN: [id number] Date of Birth: 10-25-1960  Subjective/Objective:   Pt admitted with acute ischemia of  left leg  S/p thrombectomy              Action/Plan: PTA pt lived at home, needs PCP- will see pt regarding PCP needs prior to discharge.   Expected Discharge Date:                  Expected Discharge Plan:  Home/Self Care  In-House Referral:     Discharge planning Services  CM Consult, Other - See comment, Medication Assistance, Indigent Health Clinic, Follow-up appt scheduled  Post Acute Care Choice:    Choice offered to:     DME Arranged:    DME Agency:     HH Arranged:    HH Agency:     Status of Service:  In process, will continue to follow  If discussed at Long Length of Stay Meetings, dates discussed:    Discharge Disposition:   Additional Comments:  11/14/16- 1100-- Donn Pierini RN, CM- spoke with pt regarding PCP needs- pt agreeable to f/u in one of the clinics- call made to the [facility]- f/u appointment made for Oct. 10 at 900- also discussed potential medications needs for d/c- CM will follow 1630- referral for Xarelto needs- spoke again with pt at bedside for Xarelto needs- pt provided 30 day free card to use on discharge- also provided pt assistance application that pt will need to fill out and f/u with for provider to fill out in f/u appointment to submit for approval for further assistance. Pt provided info on [facility] with appointment time- CM will continue to follow for any further needs.   Darrold Span, RN 11/14/2016, 4:39 PM

## 2016-11-15 ENCOUNTER — Telehealth: Payer: Self-pay | Admitting: Vascular Surgery

## 2016-11-15 ENCOUNTER — Inpatient Hospital Stay (HOSPITAL_COMMUNITY): Payer: Self-pay

## 2016-11-15 DIAGNOSIS — I739 Peripheral vascular disease, unspecified: Secondary | ICD-10-CM

## 2016-11-15 LAB — CBC
HCT: 38.7 % — ABNORMAL LOW (ref 39.0–52.0)
Hemoglobin: 13.1 g/dL (ref 13.0–17.0)
MCH: 29.5 pg (ref 26.0–34.0)
MCHC: 33.9 g/dL (ref 30.0–36.0)
MCV: 87.2 fL (ref 78.0–100.0)
Platelets: 239 10*3/uL (ref 150–400)
RBC: 4.44 MIL/uL (ref 4.22–5.81)
RDW: 14.4 % (ref 11.5–15.5)
WBC: 10.9 10*3/uL — AB (ref 4.0–10.5)

## 2016-11-15 LAB — APTT: aPTT: >200 seconds (ref 24–36)

## 2016-11-15 LAB — HEPARIN LEVEL (UNFRACTIONATED)
HEPARIN UNFRACTIONATED: 2.04 [IU]/mL — AB (ref 0.30–0.70)
Heparin Unfractionated: 1.88 IU/mL — ABNORMAL HIGH (ref 0.30–0.70)

## 2016-11-15 MED ORDER — RIVAROXABAN 20 MG PO TABS: 20.0000 mg | ORAL_TABLET | Freq: Every day | ORAL | Status: DC

## 2016-11-15 MED ORDER — RIVAROXABAN (XARELTO) VTE STARTER PACK (15 & 20 MG): ORAL_TABLET | ORAL | 0 refills | Status: DC

## 2016-11-15 MED ORDER — RIVAROXABAN 20 MG PO TABS: 20.0000 mg | ORAL_TABLET | Freq: Every day | ORAL | 6 refills | Status: DC

## 2016-11-15 MED ORDER — HYDROCODONE-ACETAMINOPHEN 5-325 MG PO TABS: 2.0000 | ORAL_TABLET | Freq: Once | ORAL | 0 refills | Status: AC

## 2016-11-15 MED ORDER — ASPIRIN EC 81 MG PO TBEC: 81.0000 mg | DELAYED_RELEASE_TABLET | Freq: Every day | ORAL | Status: DC

## 2016-11-15 MED ORDER — RIVAROXABAN 15 MG PO TABS
15.0000 mg | ORAL_TABLET | Freq: Two times a day (BID) | ORAL | Status: DC
  Filled 2016-11-15: qty 1

## 2016-11-15 MED ORDER — HEPARIN (PORCINE) IN NACL 100-0.45 UNIT/ML-% IJ SOLN: 1100.0000 [IU]/h | INTRAMUSCULAR | Status: DC

## 2016-11-15 NOTE — Progress Notes (Signed)
Subjective: Interval History: none.. Comfortable. Has been walking without difficulty  Objective: Vital signs in last 24 hours: Temp:  [98 F (36.7 C)-98.5 F (36.9 C)] 98 F (36.7 C) (09/19 0435) Resp:  [14-21] 16 (09/19 0435) BP: (107-122)/(60-75) 115/65 (09/19 0435) SpO2:  [96 %-100 %] 96 % (09/18 1920) Weight:  [186 lb 9.6 oz (84.6 kg)] 186 lb 9.6 oz (84.6 kg) (09/19 0317)  Intake/Output from previous day: 09/18 0701 - 09/19 0700 In: 2120.6 [P.O.:1800; I.V.:320.6] Out: 3290 [Urine:3290] Intake/Output this shift: No intake/output data recorded.  Popliteal incision healing nicely. 2+ posterior tibial and dorsalis pedis pulse  Lab Results:  Recent Labs  11/14/16 0428 11/15/16 0235  WBC 14.3* 10.9*  HGB 12.9* 13.1  HCT 38.2* 38.7*  PLT 217 239   BMET  Recent Labs  11/12/16 1154 11/13/16 0036  NA 140 138  K 3.6 4.0  CL 105 105  CO2 27 25  GLUCOSE 105* 137*  BUN 15 12  CREATININE 1.28* 1.23  CALCIUM 9.1 9.1    Studies/Results: Dg Ankle Complete Left  Result Date: 11/12/2016 CLINICAL DATA:  Acute left ankle pain.  Initial encounter. EXAM: LEFT ANKLE COMPLETE - 3+ VIEW COMPARISON:  None. FINDINGS: There is no evidence of fracture, dislocation, or joint effusion. There is no evidence of arthropathy or other focal bone abnormality. Soft tissues are unremarkable. IMPRESSION: Negative. Electronically Signed   By: Harmon Pier M.D.   On: 11/12/2016 16:11   Ct Angio Ao+bifem W & Or Wo Contrast  Result Date: 11/12/2016 CLINICAL DATA:  Pulseless left foot.  Left lower extremity pain. EXAM: CT ANGIOGRAPHY OF ABDOMINAL AORTA WITH ILIOFEMORAL RUNOFF TECHNIQUE: Multidetector CT imaging of the abdomen, pelvis and lower extremities was performed using the standard protocol during bolus administration of intravenous contrast. Multiplanar CT image reconstructions and MIPs were obtained to evaluate the vascular anatomy. CONTRAST:  100 cc Isovue 370 IV. COMPARISON:  01/12/2012 CT  abdomen/pelvis. FINDINGS: VASCULAR Aorta: Normal caliber abdominal aorta without aneurysm, dissection, vasculitis or significant stenosis. Celiac: Patent without evidence of aneurysm, dissection, vasculitis or significant stenosis. SMA: Patent without evidence of aneurysm, dissection, vasculitis or significant stenosis. Renals: Single renal arteries bilaterally. Both renal arteries are patent without evidence of aneurysm, dissection, vasculitis, fibromuscular dysplasia or significant stenosis. IMA: Patent without evidence of aneurysm, dissection, vasculitis or significant stenosis. RIGHT Lower Extremity Inflow: Common, internal and external iliac arteries are patent without evidence of aneurysm, dissection, vasculitis or significant stenosis. Outflow: Common, superficial and profunda femoral arteries and the popliteal artery are patent without evidence of aneurysm, dissection, vasculitis or significant stenosis. Runoff: Patent two vessel (peroneal and posterior tibial) runoff to the ankle, noting patent dorsalis pedis artery arising from the peroneal artery. Anterior tibial artery is patent proximally and becomes diminutive at the level of the mid to distal tibia. LEFT Lower Extremity Inflow: Common, internal and external iliac arteries are patent without evidence of aneurysm, dissection, vasculitis or significant stenosis. Outflow: Patent common iliac artery. Proximal occlusion of the profunda femoral artery (Series 5/images 204-206). High-grade stenosis of the superficial femoral artery proximally (series 5/image 225). Distal occlusion of the popliteal artery (series 5/images 389-391). Runoff: Zero vessel runoff to the left ankle. Anterior tibial artery, posterior tibial artery and peroneal arteries are occluded. Veins: No obvious venous abnormality within the limitations of this arterial phase study. Review of the MIP images confirms the above findings. NON-VASCULAR Lower chest: Small air cyst in the basilar  right lower lobe is stable since 01/12/2012 and nonspecific. No  acute abnormality at the lung bases. Hepatobiliary: Normal liver size. Subcentimeter hypodense left liver lobe lesion is too small to characterize and stable, considered benign. No new liver lesions. Normal gallbladder, with no radiopaque cholelithiasis. No biliary ductal dilatation. Pancreas: Normal, with no mass or duct dilation. Spleen: Normal size. No mass. Adrenals/Urinary Tract: Normal adrenals. No hydronephrosis. Subcentimeter hypodense renal cortical lesion in the lateral upper left kidney, too small to characterize, for which no follow-up is required. No additional contour deforming renal lesions. Normal bladder. Stomach/Bowel: Small hiatal hernia. Otherwise grossly normal stomach. Normal caliber small bowel with no small bowel wall thickening. Appendectomy. Normal large bowel with no diverticulosis, large bowel wall thickening or pericolonic fat stranding. Vascular/Lymphatic: Nonaneurysmal abdominal aorta. No pathologically enlarged lymph nodes in the abdomen or pelvis. Reproductive: Normal size prostate. Other: No pneumoperitoneum, ascites or focal fluid collection. Musculoskeletal: No aggressive appearing focal osseous lesions. IMPRESSION: VASCULAR 1. Occluded distal left popliteal artery with zero vessel runoff to the left ankle. 2. 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 3. Two-vessel runoff to the right ankle, see comments. NON-VASCULAR Small hiatal hernia.  Otherwise no acute abnormality. These results were called by telephone at the time of interpretation on 11/12/2016 at 6:38 pm to Dr. Lynden Oxford, who verbally acknowledged these results. Electronically Signed   By: Delbert Phenix M.D.   On: 11/12/2016 18:44   Ct Angio Chest Aorta W/cm &/or Wo/cm  Result Date: 11/14/2016 CLINICAL DATA:  Aortic disease, nontraumatic, known or suspected. History of pneumonia, pulmonary embolism, DVT. EXAM: CT ANGIOGRAPHY CHEST WITH  CONTRAST TECHNIQUE: Multidetector CT imaging of the chest was performed using the standard protocol during bolus administration of intravenous contrast. Multiplanar CT image reconstructions and MIPs were obtained to evaluate the vascular anatomy. CONTRAST:  100 cc Isovue 370 COMPARISON:  CTA chest dated 01/07/2012. FINDINGS: Cardiovascular: There is no pulmonary embolism identified within the main, lobar or segmental pulmonary arteries bilaterally. Heart size is normal. No pericardial effusion. No aortic aneurysm. Insufficient intra-aortic contrast to evaluate for dissection. Mediastinum/Nodes: No mass or enlarged lymph nodes within the mediastinum or perihilar regions. Esophagus appears normal. Trachea appears normal. Lungs/Pleura: Parabronchial thickening within the central perihilar regions, right slightly greater than left. More peripheral bronchioles are unremarkable. Stable focal scarring/fibrosis within the right lower lobe. Lungs otherwise clear. No pleural effusion or pneumothorax. Upper Abdomen: No acute abnormality. Musculoskeletal: No chest wall abnormality. No acute or significant osseous findings. Review of the MIP images confirms the above findings. IMPRESSION: 1. No acute findings. No pulmonary embolism. No pneumonia or pulmonary edema. 2. No aortic aneurysm. Insufficient intra-aortic contrast to evaluate for dissection. 3. Bronchitic changes, acute versus chronic, favor acute. Electronically Signed   By: Bary Richard M.D.   On: 11/14/2016 20:46   Anti-infectives: Anti-infectives    Start     Dose/Rate Route Frequency Ordered Stop   11/12/16 2200  cefUROXime (ZINACEF) 1.5 g in dextrose 5 % 50 mL IVPB     1.5 g 100 mL/hr over 30 Minutes Intravenous Every 12 hours 11/12/16 2042 11/13/16 1040   11/12/16 1820  dextrose 5 % with cefUROXime (ZINACEF) ADS Med    Comments:  Joneen Caraway   : cabinet override      11/12/16 1820 11/12/16 1836      Assessment/Plan: s/p  Procedure(s): THROMBECTOMY LEFT SUPERFICIAL FEMORAL ARTERY, LEFT POPLITEAL ARTERY AND LEFT TIBIAL ARTERY  (Left) CT scan shows no source for embolus in his chest. 2-D echocardiogram was negative. Stable for discharge today. Has 30  day trial ofXaralto.. Need to assure what happens after this 30 days before he is discharged home.   LOS: 3 days   Daniel Glover 11/15/2016, 7:07 AM

## 2016-11-15 NOTE — Progress Notes (Signed)
VASCULAR LAB PRELIMINARY  ARTERIAL  ABI completed: Right ABI of 1.15 and left ABI of 1.15 are suggestive of arterial flow within normal limits at rest.   RIGHT    LEFT    PRESSURE WAVEFORM  PRESSURE WAVEFORM  BRACHIAL 136 Triphasic BRACHIAL 130 Triphasic  DP 157 Triphasic DP 155 Triphasic  PT 153 Triphasic PT 157 Triphasic  GREAT TOE 107 NA GREAT TOE 91 NA    RIGHT LEFT  ABI 1.15 1.15     Elsie Stain, RVT 11/15/2016, 12:57 PM

## 2016-11-15 NOTE — Telephone Encounter (Signed)
-----   Message from Sharee Pimple, RN sent at 11/15/2016 10:59 AM EDT ----- Regarding: RE: 2 weeks w/ ABIs Yes, he's postop in 2 weeks, needs to compare blood flow, Thanks ----- Message ----- From: Jena Gauss Sent: 11/15/2016  10:14 AM To: Sharee Pimple, RN Subject: RE: 2 weeks w/ ABIs                            This pt is having ABIs today, should we repeat?  ----- Message ----- From: Sharee Pimple, RN Sent: 11/15/2016   9:07 AM To: Donita Brooks Admin Pool Subject: 2 weeks w/ ABIs                                  ----- Message ----- From: Dara Lords, PA-C Sent: 11/15/2016   8:43 AM To: [facility] Charge Pool  S/p Left superficial femoral, popliteal and tibial thrombectomy via below-knee popliteal artery  F.u with Dr. Arbie Cookey in 2 weeks with ABI's

## 2016-11-15 NOTE — Progress Notes (Signed)
11/15/2016 2:09 PM Discharge AVS meds taken today and those due this evening reviewed.  Follow-up appointments and when to call md reviewed.  D/C IV and TELE.  Questions and concerns addressed.   D/C home per orders. Kathryne Hitch

## 2016-11-15 NOTE — Care Management Note (Signed)
Case Management Note Donn Pierini RN, BSN Unit 4E-Case Manager [phone]  Patient Details  Name: Daniel Glover MRN: [id number] Date of Birth: February 28, 1960  Subjective/Objective:   Pt admitted with acute ischemia of  left leg  S/p thrombectomy              Action/Plan: PTA pt lived at home, needs PCP- will see pt regarding PCP needs prior to discharge.   Expected Discharge Date:  11/15/16               Expected Discharge Plan:  Home/Self Care  In-House Referral:     Discharge planning Services  CM Consult, Other - See comment, Medication Assistance, Indigent Health Clinic, Follow-up appt scheduled  Post Acute Care Choice:  NA Choice offered to:  NA  DME Arranged:    DME Agency:     HH Arranged:    HH Agency:     Status of Service:  Completed, signed off  If discussed at [employer] of Stay Meetings, dates discussed:    Discharge Disposition: home/self care   Additional Comments:  11/15/16- 1200- Velecia Ovitt RN, CM- pt for d/c home today- has been given script for starter pack of Xarelto- bedside RN to let pt know to go to [facility] on [address] to have this filled and use 30 day free card with it- no further medication needs for discharge.   11/14/16- 1100-- Donn Pierini RN, CM- spoke with pt regarding PCP needs- pt agreeable to f/u in one of the clinics- call made to the [facility]- f/u appointment made for Oct. 10 at 900- also discussed potential medications needs for d/c- CM will follow 1630- referral for Xarelto needs- spoke again with pt at bedside for Xarelto needs- pt provided 30 day free card to use on discharge- also provided pt assistance application that pt will need to fill out and f/u with for provider to fill out in f/u appointment to submit for approval for further assistance. Pt provided info on [facility] with appointment time- CM will continue to follow for any further needs.   Darrold Span, RN 11/15/2016, 12:11 PM

## 2016-11-15 NOTE — Discharge Instructions (Signed)
° °[facility] ° °Discharge instructions ° °Lower Extremity Bypass Surgery ° °Please refer to the following instruction for your post-procedure care. Your surgeon or physician assistant will discuss any changes with you. ° °Activity ° °You are encouraged to walk as much as you can. You can slowly return to normal activities during the month after your surgery. Avoid strenuous activity and heavy lifting until your doctor tells you it's OK. Avoid activities such as vacuuming or swinging a golf club. Do not drive until your doctor give the OK and you are no longer taking prescription pain medications. It is also normal to have difficulty with sleep habits, eating and bowel movement after surgery. These will go away with time. ° °Bathing/Showering ° °You may shower after you go home. Do not soak in a bathtub, hot tub, or swim until the incision heals completely. ° °Incision Care ° °Clean your incision with mild soap and water. Shower every day. Pat the area dry with a clean towel. You do not need a bandage unless otherwise instructed. Do not apply any ointments or creams to your incision. If you have open wounds you will be instructed how to care for them or a visiting nurse may be arranged for you. If you have staples or sutures along your incision they will be removed at your post-op appointment. You may have skin glue on your incision. Do not peel it off. It will come off on its own in about one week. ° °Wash the groin wound with soap and water daily and pat dry. (No tub bath-only shower)  Then put a dry gauze or washcloth in the groin to keep this area dry to help prevent wound infection.  Do this daily and as needed.  Do not use Vaseline or neosporin on your incisions.  Only use soap and water on your incisions and then protect and keep dry. ° °Diet ° °Resume your normal diet. There are no special food restrictions following this procedure. A low fat/ low cholesterol diet is  recommended for all patients with vascular disease. In order to heal from your surgery, it is CRITICAL to get adequate nutrition. Your body requires vitamins, minerals, and protein. Vegetables are the best source of vitamins and minerals. Vegetables also provide the perfect balance of protein. Processed food has little nutritional value, so try to avoid this. ° °Medications ° °Resume taking all your medications unless your doctor or nurse practitioner tells you not to. If your incision is causing pain, you may take over-the-counter pain relievers such as acetaminophen (Tylenol). If you were prescribed a stronger pain medication, please aware these medication can cause nausea and constipation. Prevent nausea by taking the medication with a snack or meal. Avoid constipation by drinking plenty of fluids and eating foods with high amount of fiber, such as fruits, vegetables, and grains. Take Colase 100 mg (an over-the-counter stool softener) twice a day as needed for constipation. Do not take Tylenol if you are taking prescription pain medications. ° °Follow Up ° °Our office will schedule a follow up appointment 2-3 weeks following discharge. ° °Please call us immediately for any of the following conditions ° °•Severe or worsening pain in your legs or feet while at rest or while walking •Increase pain, redness, warmth, or drainage (pus) from your incision site(s) °Fever of 101 degree or higher °The swelling in your leg with the bypass suddenly worsens and becomes more painful than when you were in the hospital °If you have   been instructed to feel your graft pulse then you should do so every day. If you can no longer feel this pulse, call the office immediately. Not all patients are given this instruction. ° °Leg swelling is common after leg bypass surgery. ° °The swelling should improve over a few months following surgery. To improve the swelling, you may elevate your legs above the level of your heart while you are  sitting or resting. Your surgeon or physician assistant may ask you to apply an ACE wrap or wear compression (TED) stockings to help to reduce swelling. ° °Reduce your risk of vascular disease ° °Stop smoking. If you would like help call [facility] at 1-800-QUIT-NOW (1-800-784-8669) or [place] at 336-586-4000. ° °Manage your cholesterol °Maintain a desired weight °Control your diabetes weight °Control your diabetes °Keep your blood pressure down ° °If you have any questions, please call the office at [phone] ° ° °

## 2016-11-15 NOTE — Telephone Encounter (Signed)
Sched appt 11/28/16; lab at 8:00 and MD at 8:30. Spoke to pt.

## 2016-11-15 NOTE — Progress Notes (Signed)
ANTICOAGULATION CONSULT NOTE  Pharmacy Consult for heparin Indication: DVT  No Known Allergies  Patient Measurements: Height:  (167.6 cm) Weight: 186 lb 9.6 oz (84.6 kg) IBW/kg (Calculated) : 63.8 Heparin Dosing Weight: 80.3  Vital Signs: Temp: 98 F (36.7 C) (09/19 0435) Temp Source: Oral (09/19 0435) BP: 115/65 (09/19 0435)  Labs:  Recent Labs  11/12/16 1154 11/13/16 0036 11/13/16 1442  11/14/16 0428 11/14/16 1122 11/15/16 0235 11/15/16 0440  HGB 15.2 14.8  --   --  12.9*  --  13.1  --   HCT 44.9 43.2  --   --  38.2*  --  38.7*  --   PLT 237 227  --   --  217  --  239  --   APTT  --   --  31  --  62*  --  >200*  --   HEPARINUNFRC  --   --   --   < > 0.45 0.61 1.88* 2.04*  CREATININE 1.28* 1.23  --   --   --   --   --   --   < > = values in this interval not displayed.  Estimated Creatinine Clearance: 70 mL/min (by C-G formula based on SCr of 1.23 mg/dL).  Assessment: 56 y.o. male with DVT s/p thrombectomy for heparin.  Heparin level increased signinficantly this morning--verified with redraw.  Goal of Therapy:  Heparin level 0.3-0.7 units/ml Monitor platelets by anticoagulation protocol: Yes   Plan:  Hold heparin x 45 minutes, then decrease heparin 1100 units/hr Check heparin level in 8 hours.  Geannie Risen, PharmD, BCPS 11/15/2016 5:42 AM

## 2016-11-15 NOTE — Progress Notes (Signed)
ANTICOAGULATION CONSULT NOTE  Pharmacy Consult for heparin Indication: DVT  No Known Allergies  Patient Measurements: Height:  (167.6 cm) Weight: 186 lb 9.6 oz (84.6 kg) IBW/kg (Calculated) : 63.8 Heparin Dosing Weight: 80.3  Vital Signs: Temp: 98 F (36.7 C) (09/19 0435) Temp Source: Oral (09/19 0435) BP: 115/65 (09/19 0435)  Labs:  Recent Labs  11/12/16 1154 11/13/16 0036 11/13/16 1442  11/14/16 0428 11/14/16 1122 11/15/16 0235 11/15/16 0440  HGB 15.2 14.8  --   --  12.9*  --  13.1  --   HCT 44.9 43.2  --   --  38.2*  --  38.7*  --   PLT 237 227  --   --  217  --  239  --   APTT  --   --  31  --  62*  --  >200*  --   HEPARINUNFRC  --   --   --   < > 0.45 0.61 1.88* 2.04*  CREATININE 1.28* 1.23  --   --   --   --   --   --   < > = values in this interval not displayed.  Estimated Creatinine Clearance: 70 mL/min (by C-G formula based on SCr of 1.23 mg/dL).  Assessment: 56 y.o. male with DVT s/p thrombectomy. Patient was previously on heparin infusion, now transitioning to Xarelto. CBC stable since admission; Baseline sCr ~1.2, CrCl 70 mL/min  Goal of Therapy:  Heparin level 0.3-0.7 units/ml Monitor platelets by anticoagulation protocol: Yes   Plan:  D/C heparin infusion Start Xarelto  BID for 21 days, then transition to Xarelto  QD Monitor renal function and for s/sx of bleeding  Ruben Im, PharmD Clinical Pharmacist 11/15/2016 9:39 AM

## 2016-11-15 NOTE — Discharge Summary (Signed)
Discharge Summary    Daniel Glover August 15, 1960 56 y.o. male  [id number]  Admission Date: 11/12/2016  Discharge Date: 11/15/16  Physician: [name], MD  Admission Diagnosis: leg pain chest pain cold leg   HPI:   This is a 56 y.o. male who is seen in the emergency room with acute ischemia of his left leg. He reports that around 10 AM this morning he had sudden onset of pain and numbness and tingling in his left leg and pain in his thigh. He reports no similar symptoms and specifically has had no prior history of claudication in his right or left leg. No prior history of peripheral vascular occlusive disease or coronary artery disease. 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 At that time a hypercoagulable workup was negative. He had been maintained on Coumadin but this is been discontinued. Other prior history of thromboembolic event. He does smoke cigarettes.  Hospital Course:  The patient was admitted to the hospital and taken to the operating room on 11/12/2016 and underwent: Left superficial femoral, popliteal and tibial thrombectomy via below-knee popliteal artery    The pt tolerated the procedure well and was transported to the PACU in good condition.   By POD 1, he was doing well.  He was continued on the heparin gtt.  A hem/onc consult was obtained.  He did undergo a 2D echocardiogram with the following results: Study Conclusions  - Left ventricle: The cavity size was normal. Systolic function was   normal. The estimated ejection fraction was in the range of 55%   to 60%. Wall motion was normal; there were no regional wall   motion abnormalities. Left ventricular diastolic function   parameters were normal. - Right ventricle: The cavity size was normal. Wall thickness was   normal. - Right atrium: The atrium was normal in size.  Recommendations from Hem/onc as follows: Recommendations: --Consider CTA Chest/Abdomen and pelvis to eval for  potential other threatened vascular beds. --Continue Heprin gtt for now at the discretion fo Dr Arbie Cookey. --When considering discharge home, transition to rivaroxaban  PO BID x21 days, then  PO Daily  or apixaban 10 mg orally twice daily for the first 7 days of therapy; after 7 days, 5 mg orally twice daily  --Due to the arterial nature of the clot, recommend addition of a full-dose antiplatelet such as aspirin or clopidogrel, but not dual antiplatelets as, when coupled with an anticoagulant, that may cause an unacceptable bleeding risk. --At this time, I do not recommend any additional lab testing as the results are likely to be affected by the the recent thrombosis and thrombectomy --I will f/u with the patient 2-4 weeks after discharge to continue monitoring and adjusting therapy  On POD 2, he underwent a CTA of the chest with the following results: IMPRESSION: 1. No acute findings. No pulmonary embolism. No pneumonia or pulmonary edema. 2. No aortic aneurysm. Insufficient intra-aortic contrast to evaluate for dissection. 3. Bronchitic changes, acute versus chronic, favor acute.  By POD 3, the pt was doing well with easily palpable pulses.  He did have a superficial separation of the proximal portion of his incision.  Benzoin was applied and steri strips placed.  He was started on Xarelto and his heparin gtt was discontinued.  He was given a 30 day trial of Xarelto and was given Rx for Xarelto.  He will drop his paper work off at the office after he is discharged for assistance with Xarelto.  He will f/u with Dr. Arbie Cookey in 2 weeks.   He will f/u with the [facility] next week to have renal function checked.   The remainder of the hospital course consisted of increasing mobilization and increasing intake of solids without difficulty.  CBC    Component Value Date/Time   WBC 10.9 (H) 11/15/2016 0235   RBC 4.44 11/15/2016 0235   HGB 13.1 11/15/2016 0235   HGB 15.1  07/10/2012 1255   HCT 38.7 (L) 11/15/2016 0235   HCT 43.9 07/10/2012 1255   PLT 239 11/15/2016 0235   PLT 209 07/10/2012 1255   MCV 87.2 11/15/2016 0235   MCV 88.7 07/10/2012 1255   MCH 29.5 11/15/2016 0235   MCHC 33.9 11/15/2016 0235   RDW 14.4 11/15/2016 0235   RDW 13.9 07/10/2012 1255   LYMPHSABS 2.1 07/10/2012 1255   MONOABS 0.8 07/10/2012 1255   EOSABS 0.2 07/10/2012 1255   BASOSABS 0.0 07/10/2012 1255    BMET    Component Value Date/Time   NA 138 11/13/2016 0036   NA 136 07/10/2012 1255   K 4.0 11/13/2016 0036   K 3.7 07/10/2012 1255   CL 105 11/13/2016 0036   CL 103 07/10/2012 1255   CO2 25 11/13/2016 0036   CO2 26 07/10/2012 1255   GLUCOSE 137 (H) 11/13/2016 0036   GLUCOSE 95 07/10/2012 1255   BUN 12 11/13/2016 0036   BUN 11.0 07/10/2012 1255   CREATININE 1.23 11/13/2016 0036   CREATININE 1.1 07/10/2012 1255   CALCIUM 9.1 11/13/2016 0036   CALCIUM 8.8 07/10/2012 1255   GFRNONAA >60 11/13/2016 0036   GFRAA >60 11/13/2016 0036        Discharge Diagnosis:  leg pain chest pain cold leg  Secondary Diagnosis: Patient Active Problem List   Diagnosis Date Noted  . Lower limb ischemia 11/12/2016  . PAD (peripheral artery disease) (HCC) 11/12/2016  . Right atrial thrombus 01/09/2012  . Pleuritic Chest Pain  01/07/2012  . Acute Bilateral PE (pulmonary embolism) 01/07/2012  . Acute DVT Right LE 01/07/2012   Past Medical History:  Diagnosis Date  . 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 PE (pulmonary embolism)   . Pneumonia      Allergies as of 11/15/2016   No Known Allergies     Medication List    STOP taking these medications   GOODYS BODY PAIN PO     TAKE these medications   aspirin EC 81 MG tablet Take 1 tablet (81 mg total) by mouth daily.   HYDROcodone-acetaminophen 5-325 MG tablet Commonly known as:  NORCO/VICODIN Take 2 tablets by mouth once. Obtained 2 tabs from his son to take   Rivaroxaban  15 & 20 MG Tbpk Take as directed on package: Start with one  tablet by mouth twice a day with food. On Day 22, switch to one  tablet once a day with food.   rivaroxaban 20 MG Tabs tablet Commonly known as:  XARELTO Take 1 tablet (20 mg total) by mouth daily with supper. Start taking on:  12/07/2016   traMADol 50 MG tablet Commonly known as:  ULTRAM Take 1 tablet (50 mg total) by mouth every 6 (six) hours as needed.            Discharge Care Instructions        Start     Ordered   12/07/16 0000  rivaroxaban (XARELTO) 20 MG TABS tablet  (rivaroxaban (  XARELTO) for Nonvalvular Atrial Fibrillation)  Daily with supper    Question:  Supervising Provider  Answer:  EARLY, TODD F   11/15/16 0842   11/15/16 0000  HYDROcodone-acetaminophen (NORCO/VICODIN) 5-325 MG tablet   Once    Question:  Supervising Provider  Answer:  EARLY, TODD F   11/15/16 0842   11/15/16 0000  Rivaroxaban 15 & 20 MG TBPK    Question:  Supervising Provider  Answer:  EARLY, TODD F   11/15/16 0842   11/15/16 0000  aspirin EC 81 MG tablet  Daily     11/15/16 1208     Instructions:   [facility]  Discharge instructions  Lower Extremity Bypass Surgery  Please refer to the following instruction for your post-procedure care. Your surgeon or physician assistant will discuss any changes with you.  Activity  You are encouraged to walk as much as you can. You can slowly return to normal activities during the month after your surgery. Avoid strenuous activity and heavy lifting until your doctor tells you it's OK. Avoid activities such as vacuuming or swinging a golf club. Do not drive until your doctor give the OK and you are no longer taking prescription pain medications. It is also normal to have difficulty with sleep habits, eating and bowel movement after surgery. These will go away with time.  Bathing/Showering  You may shower after you go home. Do not soak in a bathtub,  hot tub, or swim until the incision heals completely.  Incision Care  Clean your incision with mild soap and water. Shower every day. Pat the area dry with a clean towel. You do not need a bandage unless otherwise instructed. Do not apply any ointments or creams to your incision. If you have open wounds you will be instructed how to care for them or a visiting nurse may be arranged for you. If you have staples or sutures along your incision they will be removed at your post-op appointment. You may have skin glue on your incision. Do not peel it off. It will come off on its own in about one week.  Wash the groin wound with soap and water daily and pat dry. (No tub bath-only shower)  Then put a dry gauze or washcloth in the groin to keep this area dry to help prevent wound infection.  Do this daily and as needed.  Do not use Vaseline or neosporin on your incisions.  Only use soap and water on your incisions and then protect and keep dry.  Diet  Resume your normal diet. There are no special food restrictions following this procedure. A low fat/ low cholesterol diet is recommended for all patients with vascular disease. In order to heal from your surgery, it is CRITICAL to get adequate nutrition. Your body requires vitamins, minerals, and protein. Vegetables are the best source of vitamins and minerals. Vegetables also provide the perfect balance of protein. Processed food has little nutritional value, so try to avoid this.  Medications  Resume taking all your medications unless your doctor or nurse practitioner tells you not to. If your incision is causing pain, you may take over-the-counter pain relievers such as acetaminophen (Tylenol). If you were prescribed a stronger pain medication, please aware these medication can cause nausea and constipation. Prevent nausea by taking the medication with a snack or meal. Avoid constipation by drinking plenty of fluids and eating foods with high amount of fiber,  such as fruits, vegetables, and grains. Take  Colase 100 mg (an over-the-counter stool softener) twice a day as needed for constipation. Do not take Tylenol if you are taking prescription pain medications.  Follow Up  Our office will schedule a follow up appointment 2-3 weeks following discharge.  Please call us immediately for any of the following conditions  .Severe or worsening pain in your legs or feet while at rest or while walking .Increase pain, redness, warmth, or drainage (pus) from your incision site(s) Fever of 101 degree or higher The swelling in your leg with the bypass suddenly worsens and becomes more painful than when you were in the hospital If you have been instructed to feel your graft pulse then you should do so every day. If you can no longer feel this pulse, call the office immediately. Not all patients are given this instruction.  Leg swelling is common after leg bypass surgery.  The swelling should improve over a few months following surgery. To improve the swelling, you may elevate your legs above the level of your heart while you are sitting or resting. Your surgeon or physician assistant may ask you to apply an ACE wrap or wear compression (TED) stockings to help to reduce swelling.  Reduce your risk of vascular disease  Stop smoking. If you would like help call [facility] at 1-800-QUIT-NOW (585-373-9243) or [place] at 4323796344.  Manage your cholesterol Maintain a desired weight Control your diabetes weight Control your diabetes Keep your blood pressure down  If you have any questions, please call the office at [phone]   Prescriptions given: 1.  Vicodin #30 No Refill 2.  Xarelto starter pack 3.  Xarelto  daily #30 RF 4. Aspirin  daily (OTC)  Disposition: home  Patient's condition: is Good  Follow up: 1. Dr. Arbie Cookey in 2 weeks with ABI's and to verify his Xarelto paperwork was approved.  He will be dropping off paperwork to the  office to fill out at discharge.  2. [facility] 12/06/16 @ 0900 3. [facility] 1 week with renal function check 4. Dr. Gweneth Dimitri in 2-4 weeks to continue and adjust therapy   Doreatha Massed, PA-C [facility] [phone] 11/15/2016  12:09 PM

## 2016-11-16 ENCOUNTER — Telehealth: Payer: Self-pay | Admitting: Hematology and Oncology

## 2016-11-16 ENCOUNTER — Encounter: Payer: Self-pay | Admitting: Hematology and Oncology

## 2016-11-16 NOTE — Telephone Encounter (Signed)
Appt has been scheduled for the pt to see Dr. Gweneth Dimitri on 10/15 at 10am. Letter mailed with the appt information.

## 2016-11-20 ENCOUNTER — Encounter (HOSPITAL_COMMUNITY): Payer: Self-pay | Admitting: Emergency Medicine

## 2016-11-20 ENCOUNTER — Emergency Department (HOSPITAL_COMMUNITY)
Admission: EM | Admit: 2016-11-20 | Discharge: 2016-11-20 | Disposition: A | Discharge status: Home / Self Care | Payer: Self-pay | Attending: Emergency Medicine | Admitting: Emergency Medicine

## 2016-11-20 DIAGNOSIS — Z9889 Other specified postprocedural states: Secondary | ICD-10-CM | POA: Insufficient documentation

## 2016-11-20 DIAGNOSIS — T8130XA Disruption of wound, unspecified, initial encounter: Secondary | ICD-10-CM

## 2016-11-20 DIAGNOSIS — T8131XA Disruption of external operation (surgical) wound, not elsewhere classified, initial encounter: Secondary | ICD-10-CM | POA: Insufficient documentation

## 2016-11-20 DIAGNOSIS — F17210 Nicotine dependence, cigarettes, uncomplicated: Secondary | ICD-10-CM | POA: Insufficient documentation

## 2016-11-20 DIAGNOSIS — Y69 Unspecified misadventure during surgical and medical care: Secondary | ICD-10-CM | POA: Insufficient documentation

## 2016-11-20 LAB — CBC WITH DIFFERENTIAL/PLATELET
BASOS PCT: 0 %
Basophils Absolute: 0 10*3/uL (ref 0.0–0.1)
Eosinophils Absolute: 0.2 10*3/uL (ref 0.0–0.7)
Eosinophils Relative: 2 %
HEMATOCRIT: 39 % (ref 39.0–52.0)
HEMOGLOBIN: 13.3 g/dL (ref 13.0–17.0)
LYMPHS ABS: 2.4 10*3/uL (ref 0.7–4.0)
Lymphocytes Relative: 26 %
MCH: 29.6 pg (ref 26.0–34.0)
MCHC: 34.1 g/dL (ref 30.0–36.0)
MCV: 86.9 fL (ref 78.0–100.0)
MONO ABS: 1 10*3/uL (ref 0.1–1.0)
MONOS PCT: 11 %
NEUTROS ABS: 5.6 10*3/uL (ref 1.7–7.7)
Neutrophils Relative %: 61 %
Platelets: 245 10*3/uL (ref 150–400)
RBC: 4.49 MIL/uL (ref 4.22–5.81)
RDW: 14 % (ref 11.5–15.5)
WBC: 9.3 10*3/uL (ref 4.0–10.5)

## 2016-11-20 LAB — COMPREHENSIVE METABOLIC PANEL
ALK PHOS: 72 U/L (ref 38–126)
ALT: 40 U/L (ref 17–63)
ANION GAP: 7 (ref 5–15)
AST: 24 U/L (ref 15–41)
Albumin: 3.6 g/dL (ref 3.5–5.0)
BILIRUBIN TOTAL: 0.8 mg/dL (ref 0.3–1.2)
BUN: 18 mg/dL (ref 6–20)
CO2: 26 mmol/L (ref 22–32)
Calcium: 9.1 mg/dL (ref 8.9–10.3)
Chloride: 101 mmol/L (ref 101–111)
Creatinine, Ser: 1.26 mg/dL — ABNORMAL HIGH (ref 0.61–1.24)
Glucose, Bld: 100 mg/dL — ABNORMAL HIGH (ref 65–99)
POTASSIUM: 4 mmol/L (ref 3.5–5.1)
Sodium: 134 mmol/L — ABNORMAL LOW (ref 135–145)
TOTAL PROTEIN: 6.9 g/dL (ref 6.5–8.1)

## 2016-11-20 LAB — PROTIME-INR
INR: 1.3
Prothrombin Time: 16.1 seconds — ABNORMAL HIGH (ref 11.4–15.2)
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
## 2016-11-20 NOTE — ED Triage Notes (Signed)
Pt presents with post -op complications to LLE; pt reports thrombectomy on Sunday 9/16 and discharged on 9/18; pt reporting drainage, swelling, and wound dehiscence
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
## 2016-11-20 NOTE — ED Notes (Signed)
Dr. Early at bedside 

## 2016-11-20 NOTE — ED Notes (Signed)
Pt verbalized understanding of d/c instructions and has no further questions. Pt stable and nAD. VSS. SW set up home care for pt for dressing changes.

## 2016-11-21 ENCOUNTER — Other Ambulatory Visit: Payer: Self-pay

## 2016-11-21 DIAGNOSIS — Z48812 Encounter for surgical aftercare following surgery on the circulatory system: Secondary | ICD-10-CM

## 2016-11-24 ENCOUNTER — Encounter: Payer: Self-pay | Admitting: Vascular Surgery

## 2016-11-28 ENCOUNTER — Encounter: Payer: Self-pay | Admitting: Vascular Surgery

## 2016-11-28 ENCOUNTER — Ambulatory Visit (HOSPITAL_COMMUNITY)
Admission: RE | Admit: 2016-11-28 | Discharge: 2016-11-28 | Disposition: A | Discharge status: Home / Self Care | Payer: Self-pay | Source: Ambulatory Visit | Attending: Vascular Surgery | Admitting: Vascular Surgery

## 2016-11-28 ENCOUNTER — Ambulatory Visit (INDEPENDENT_AMBULATORY_CARE_PROVIDER_SITE_OTHER): Payer: Self-pay | Admitting: Vascular Surgery

## 2016-11-28 VITALS — BP 126/77 | HR 58 | Temp 97.6°F | Resp 20 | Ht 66.0 in | Wt 182.0 lb

## 2016-11-28 DIAGNOSIS — I739 Peripheral vascular disease, unspecified: Secondary | ICD-10-CM

## 2016-11-28 DIAGNOSIS — Z48812 Encounter for surgical aftercare following surgery on the circulatory system: Secondary | ICD-10-CM

## 2016-11-28 MED ORDER — TRAMADOL HCL 50 MG PO TABS: 50.0000 mg | ORAL_TABLET | ORAL | 0 refills | Status: DC | PRN

## 2016-11-28 NOTE — Progress Notes (Signed)
   Patient name: Daniel Glover MRN: [id number] DOB: 01/04/1961 Sex: male  REASON FOR VISIT: Follow-up of left superficial femoral artery, popliteal artery and tibial thrombectomy  HPI: Daniel Glover is a 56 y.o. male her today for follow-up. He presented on 11/12/2016 with critical limb ischemia of his left leg. He had a prior history of venous thrombus but no prior history of arterial thrombus. The angiogram revealed a thrombus in his superficial femoral artery popliteal and tibial vessels. He underwent uneventful thrombectomy via a medial approach to below-knee popliteal artery. He did well in the hospital and discharged home on presumed lifelong anticoagulation. He does have a follow-up with hematology in approximately 1 week. He presented to the emergency department proximal one week following surgery with separation of his popliteal incision and serous drainage. He continued to have easily palpable dorsalis pedis and posterior tibial pulse that time. There is no evidence of infection. He had no erythema or purulence. Did have a great deal of serous drainage and was instructed on local wound care he is here today for follow-up  Current Outpatient Prescriptions  Medication Sig Dispense Refill  . [START ON 12/07/2016] rivaroxaban (XARELTO) 20 MG TABS tablet Take 1 tablet (20 mg total) by mouth daily with supper. 30 tablet 6  . Rivaroxaban 15 & 20 MG TBPK Take as directed on package: Start with one  tablet by mouth twice a day with food. On Day 22, switch to one  tablet once a day with food. 51 each 0  . aspirin EC 81 MG tablet Take 1 tablet (81 mg total) by mouth daily. (Patient not taking: Reported on 11/20/2016)    . traMADol (ULTRAM) 50 MG tablet Take 1 tablet (50 mg total) by mouth every 4 (four) hours as needed. 30 tablet 0   No current facility-administered medications for this visit.      PHYSICAL EXAM: Vitals:   11/28/16 0850  BP: 126/77  Pulse:  (!) 58  Resp: 20  Temp: 97.6 F (36.4 C)  TempSrc: Oral  SpO2: 97%  Weight: 182 lb (82.6 kg)  Height:  (1.676 m)    GENERAL: The patient is a well-nourished male, in no acute distress. The vital signs are documented above. 2+ dorsalis pedis and 2+ posterior tibial pulse. Mild pitting edema in his left leg and none on his right. He does have excellent granulation tissue on his wound which is approximately 1.5 x 3 and half centimeters in size.  MEDICAL ISSUES: Stable overall. We'll continue wet-to-dry dressing changes to his incision. Is requesting pain medication for discomfort surrounding the incision was written Ultram No. 30 with no refills. We will see him again in 6 weeks for continued follow-up   [name], MD [facility] Tel [phone] Pager [phone]

## 2016-11-28 NOTE — Progress Notes (Signed)
Rx for Tramadol #15 printed in error and voided. Rx for #30 tablets was printed and signed by Md.

## 2016-12-06 ENCOUNTER — Ambulatory Visit: Payer: Self-pay | Admitting: Family Medicine

## 2016-12-06 ENCOUNTER — Ambulatory Visit: Payer: Self-pay | Admitting: Hematology and Oncology

## 2016-12-06 ENCOUNTER — Other Ambulatory Visit: Payer: Self-pay

## 2016-12-11 ENCOUNTER — Other Ambulatory Visit: Payer: Self-pay

## 2016-12-11 ENCOUNTER — Inpatient Hospital Stay: Payer: Self-pay | Admitting: Hematology and Oncology

## 2016-12-13 ENCOUNTER — Other Ambulatory Visit: Payer: Self-pay

## 2016-12-14 ENCOUNTER — Other Ambulatory Visit: Payer: Self-pay

## 2016-12-14 ENCOUNTER — Encounter: Payer: Self-pay | Admitting: Hematology and Oncology

## 2016-12-14 ENCOUNTER — Telehealth: Payer: Self-pay | Admitting: Hematology and Oncology

## 2016-12-14 ENCOUNTER — Ambulatory Visit (HOSPITAL_BASED_OUTPATIENT_CLINIC_OR_DEPARTMENT_OTHER): Payer: Self-pay | Admitting: Hematology and Oncology

## 2016-12-14 VITALS — BP 114/72 | HR 68 | Temp 97.9°F | Resp 18 | Ht 66.0 in | Wt 184.0 lb

## 2016-12-14 DIAGNOSIS — I2699 Other pulmonary embolism without acute cor pulmonale: Secondary | ICD-10-CM

## 2016-12-14 DIAGNOSIS — I739 Peripheral vascular disease, unspecified: Secondary | ICD-10-CM

## 2016-12-14 DIAGNOSIS — I998 Other disorder of circulatory system: Secondary | ICD-10-CM

## 2016-12-14 DIAGNOSIS — I82402 Acute embolism and thrombosis of unspecified deep veins of left lower extremity: Secondary | ICD-10-CM

## 2016-12-14 DIAGNOSIS — I82401 Acute embolism and thrombosis of unspecified deep veins of right lower extremity: Secondary | ICD-10-CM

## 2016-12-14 DIAGNOSIS — Z720 Tobacco use: Secondary | ICD-10-CM

## 2016-12-14 NOTE — Telephone Encounter (Signed)
Gave avs and calendar for October and November  °

## 2016-12-21 ENCOUNTER — Other Ambulatory Visit (HOSPITAL_BASED_OUTPATIENT_CLINIC_OR_DEPARTMENT_OTHER): Payer: Self-pay

## 2016-12-21 DIAGNOSIS — I739 Peripheral vascular disease, unspecified: Secondary | ICD-10-CM

## 2016-12-21 DIAGNOSIS — I2699 Other pulmonary embolism without acute cor pulmonale: Secondary | ICD-10-CM

## 2016-12-21 LAB — COMPREHENSIVE METABOLIC PANEL
ALBUMIN: 3.8 g/dL (ref 3.5–5.0)
ALK PHOS: 80 U/L (ref 40–150)
ALT: 17 U/L (ref 0–55)
AST: 15 U/L (ref 5–34)
Anion Gap: 10 mEq/L (ref 3–11)
BUN: 13 mg/dL (ref 7.0–26.0)
CALCIUM: 9.6 mg/dL (ref 8.4–10.4)
CHLORIDE: 104 meq/L (ref 98–109)
CO2: 26 mEq/L (ref 22–29)
CREATININE: 1.1 mg/dL (ref 0.7–1.3)
EGFR: >60 mL/min/{1.73_m2} (ref >60)
Glucose: 123 mg/dl (ref 70–140)
POTASSIUM: 4.1 meq/L (ref 3.5–5.1)
Sodium: 139 mEq/L (ref 136–145)
Total Bilirubin: 0.87 mg/dL (ref 0.20–1.20)
Total Protein: 7.5 g/dL (ref 6.4–8.3)

## 2016-12-21 LAB — CBC WITH DIFFERENTIAL/PLATELET
BASO%: 0.3 % (ref 0.0–2.0)
BASOS ABS: 0 10*3/uL (ref 0.0–0.1)
EOS%: 3.4 % (ref 0.0–7.0)
Eosinophils Absolute: 0.2 10*3/uL (ref 0.0–0.5)
HEMATOCRIT: 43.7 % (ref 38.4–49.9)
HGB: 14.9 g/dL (ref 13.0–17.1)
LYMPH#: 1.9 10*3/uL (ref 0.9–3.3)
LYMPH%: 31.4 % (ref 14.0–49.0)
MCH: 30.1 pg (ref 27.2–33.4)
MCHC: 34.1 g/dL (ref 32.0–36.0)
MCV: 88.3 fL (ref 79.3–98.0)
MONO#: 0.5 10*3/uL (ref 0.1–0.9)
MONO%: 8.9 % (ref 0.0–14.0)
NEUT#: 3.4 10*3/uL (ref 1.5–6.5)
NEUT%: 56 % (ref 39.0–75.0)
Platelets: 237 10*3/uL (ref 140–400)
RBC: 4.95 10*6/uL (ref 4.20–5.82)
RDW: 14.1 % (ref 11.0–14.6)
WBC: 6.1 10*3/uL (ref 4.0–10.3)

## 2016-12-21 LAB — LACTATE DEHYDROGENASE: LDH: 179 U/L (ref 125–245)

## 2016-12-21 NOTE — Assessment & Plan Note (Addendum)
56 y.o. 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  Patient underwent aggressive intervention with thrombolysis/thrombectomy. Currently doing well on combination of aspirin and Rivaroxaban (Xarelto).  Etiology of the thrombosis is not entirely clear. Likely contributing factor of peripheral arterial disease, but cannot exclude a primary hematological condition such as antiphospholipid antibody syndrome considering patient having history of both venous and arterial thrombosis.  Plan: --APLS labs  --Continue combination of Rivaroxaban (Xarelto) and antiplatelet therapy indefinitely --Within to clinic in 1 month 1 for clinical status monitoring.

## 2016-12-21 NOTE — Progress Notes (Signed)
[facility] New Visit:  Assessment: Lower limb ischemia 56 y.o. 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  Patient underwent aggressive intervention with thrombolysis/thrombectomy. Currently doing well on combination of aspirin and Rivaroxaban (Xarelto).  Etiology of the thrombosis is not entirely clear. Likely contributing factor of peripheral arterial disease, but cannot exclude a primary hematological condition such as antiphospholipid antibody syndrome considering patient having history of both venous and arterial thrombosis.  Plan: --APLS labs  --Continue combination of Rivaroxaban (Xarelto) and antiplatelet therapy indefinitely --Within to clinic in 1 month 1 for clinical status monitoring.  Voice recognition software was used and creation of this note. Despite my best effort at editing the text, some misspelling/errors may have occurred.  Orders Placed This Encounter  Procedures  . CBC with Differential    Standing Status:   Future    Standing Expiration Date:   12/14/2017  . Comprehensive metabolic panel    Standing Status:   Future    Standing Expiration Date:   12/14/2017  . Lactate dehydrogenase (LDH)    Standing Status:   Future    Standing Expiration Date:   12/14/2017  . Sedimentation rate    Standing Status:   Future    Standing Expiration Date:   12/14/2017  . C-reactive protein    Standing Status:   Future    Standing Expiration Date:   12/14/2017  . C3 and C4    Standing Status:   Future    Standing Expiration Date:   12/14/2017  . ANA, IFA (with reflex)    Standing Status:   Future    Standing Expiration Date:   12/14/2017  . Beta-2-glycoprotein i abs, IgG/M/A    Standing Status:   Future    Standing Expiration Date:   12/14/2017  . Cardiolipin antibodies, IgG, IgM, IgA*    Standing  Status:   Future    Standing Expiration Date:   12/14/2017  . Lupus anticoagulant panel*    Standing Status:   Future    Standing Expiration Date:   12/14/2017    All questions were answered.  . The patient knows to call the clinic with any problems, questions or concerns.  This note was electronically signed.    History of Presenting Illness Merwin Breden is a 56 y.o. male followed in the [place] for history of DVT and a recent acute arterial thrombosis in the Left LE. 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 Initial presenting symptom at that time was pleuritic chest pain and shortness of breath without associated lower extremity discomfort or swelling. Evaluation included bilateral lower extremity ultrasound revealing distal femoral and popliteal venous clot on the left side as well as CTA of the chest demonstrating bilateral heavy burden of o'clock including main pulmonary artery and complicated by pulmonary infarcts on the left side. Additionally, intra-atrial clot was noted by echocardiogram. Patient was initially treated with intravenous heparin followed by transition to warfarin on which he remained for 1-1.5 years. At that time, patient self discontinued the medication out of frustration with continued need for follow-up. During the primary presentation, patient was evaluated by Dr. Eston Esters who has conducted thrombophilia evaluation. Evaluation demonstrated no evidence of defined thrombotic disposition. In particular, patient  tested negative for antiphospholipid antibody syndrome. Additionally, he was negative for factor V Leiden mutation. A panel of cancer markers was also negative including AFP, PSA, CA19-9, and CEA. Patient was doing well in the intervening years without recurrent symptoms of chest pain, shortness  of breath, or swelling of the extremities. He has had no history of TIA, stroke, myocardial infarction. His family history is significant for systemic lupus and sarcoidosis in his mother, his sister has had a renal transplant for an unclear diagnosis.  Patient presented on 11/12/16 with abrupt development of paresthesia in the left lower extremity followed by rapidly progressive severe pain reaching 10/10 severity. Patient was brought in the hospital for evaluation demonstrated a pulseless left lower extremity. CTA Lt LE was consitent with high-grade stenosis of the proximal left SFA as well as the distal artery with 0 residual flow to the left ankle. Patient underwent emergency left superficial femoral, popliteal, tibial thrombectomy with rapid resolution of symptoms and restoration of palpable pulses after the procedure. Subsequently, patient was started on combination of Rivaroxaban (Xarelto) and aspirin based on our recommendations. Additional thrombosis workup was delayed to allow time for recovery. Patient now returns to the clinic to continue hematological monitoring and to conduct additional assessment.  At the present time, patient denies pain anywhere. He reports normal feeling in his lower extremity. Denies abdominal pain, constipation, but does have some indigestion since the procedure. Denies chest pain, shortness of breath, or cough. No headaches. Sedation strength in the extremities.  Medical History: Past Medical History:  Diagnosis Date  . 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 PE (pulmonary embolism)   . Pneumonia     Surgical History: Past Surgical History:  Procedure Laterality Date  . APPENDECTOMY    . arm surgery    . THROMBECTOMY FEMORAL ARTERY Left 11/12/2016   Procedure: THROMBECTOMY LEFT SUPERFICIAL FEMORAL ARTERY, LEFT POPLITEAL ARTERY AND LEFT TIBIAL ARTERY ;  Surgeon: Rosetta Posner, MD;  Location: [facility];  Service: Vascular;   Laterality: Left;  . TONSILLECTOMY      Family History: Family History  Problem Relation Age of Onset  . Lupus Mother   . Bleeding Disorder Father   . Hypertension Brother     Social History: Social History   Social History  . Marital status: Single    Spouse name: N/A  . Number of children: N/A  . Years of education: N/A   Occupational History  . Not on file.   Social History Main Topics  . Smoking status: Current Every Day Smoker    Packs/day: 0.25    Years: 15.00    Types: Cigarettes  . Smokeless tobacco: Never Used  . Alcohol use Yes     Comment: occassionally  . Drug use: No  . Sexual activity: Not on file   Other Topics Concern  . Not on file   Social History Narrative  . No narrative on file    Allergies: No Known Allergies  Medications:  Current Outpatient Prescriptions  Medication Sig Dispense Refill  . aspirin EC 81 MG tablet Take 1 tablet (81 mg total) by mouth daily.    . rivaroxaban (XARELTO) 20 MG TABS tablet Take 1 tablet (20 mg total) by mouth daily with supper. 30 tablet 6  . Rivaroxaban 15 & 20 MG TBPK Take as directed on package: Start with one 40m tablet by mouth twice a day with food. On Day 22, switch to one 238m  tablet once a day with food. (Patient not taking: Reported on 12/14/2016) 51 each 0  . traMADol (ULTRAM) 50 MG tablet Take 1 tablet (50 mg total) by mouth every 4 (four) hours as needed. (Patient not taking: Reported on 12/14/2016) 30 tablet 0   No current facility-administered medications for this visit.     Review of Systems: Review of Systems  All other systems reviewed and are negative.    PHYSICAL EXAMINATION Blood pressure 114/72, pulse 68, temperature 97.9 F (36.6 C), temperature source Oral, resp. rate 18, height 5' 6" (1.676 m), weight 184 lb (83.5 kg), SpO2 99 %.  ECOG PERFORMANCE STATUS: 1 - Symptomatic but completely ambulatory  Physical Exam  Constitutional: He is oriented to person, place, and time and  well-developed, well-nourished, and in no distress. No distress.  HENT:  Head: Normocephalic and atraumatic.  Mouth/Throat: Oropharynx is clear and moist. No oropharyngeal exudate.  Eyes: Pupils are equal, round, and reactive to light. Conjunctivae and EOM are normal. No scleral icterus.  Neck: No tracheal deviation present. No thyromegaly present.  Cardiovascular: Normal rate, regular rhythm, normal heart sounds and intact distal pulses.   No murmur heard. Pulmonary/Chest: Effort normal and breath sounds normal. No respiratory distress. He has no wheezes. He has no rales.  Abdominal: Soft. Bowel sounds are normal. He exhibits no distension and no mass. There is no tenderness. There is no rebound and no guarding.  Musculoskeletal: Normal range of motion. He exhibits no edema or tenderness.  Lymphadenopathy:    He has no cervical adenopathy.  Neurological: He is alert and oriented to person, place, and time. He displays normal reflexes. No cranial nerve deficit.  Skin: Skin is warm and dry. No rash noted. He is not diaphoretic. No erythema.     LABORATORY DATA: I have personally reviewed the data as listed: No visits with results within 1 Week(s) from this visit.  Latest known visit with results is:  Admission on 11/20/2016, Discharged on 11/20/2016  Component Date Value Ref Range Status  . WBC 11/20/2016 9.3  4.0 - 10.5 K/uL Final  . RBC 11/20/2016 4.49  4.22 - 5.81 MIL/uL Final  . Hemoglobin 11/20/2016 13.3  13.0 - 17.0 g/dL Final  . HCT 11/20/2016 39.0  39.0 - 52.0 % Final  . MCV 11/20/2016 86.9  78.0 - 100.0 fL Final  . MCH 11/20/2016 29.6  26.0 - 34.0 pg Final  . MCHC 11/20/2016 34.1  30.0 - 36.0 g/dL Final  . RDW 11/20/2016 14.0  11.5 - 15.5 % Final  . Platelets 11/20/2016 245  150 - 400 K/uL Final  . Neutrophils Relative % 11/20/2016 61  % Final  . Neutro Abs 11/20/2016 5.6  1.7 - 7.7 K/uL Final  . Lymphocytes Relative 11/20/2016 26  % Final  . Lymphs Abs 11/20/2016 2.4  0.7  - 4.0 K/uL Final  . Monocytes Relative 11/20/2016 11  % Final  . Monocytes Absolute 11/20/2016 1.0  0.1 - 1.0 K/uL Final  . Eosinophils Relative 11/20/2016 2  % Final  . Eosinophils Absolute 11/20/2016 0.2  0.0 - 0.7 K/uL Final  . Basophils Relative 11/20/2016 0  % Final  . Basophils Absolute 11/20/2016 0.0  0.0 - 0.1 K/uL Final  . Prothrombin Time 11/20/2016 16.1* 11.4 - 15.2 seconds Final  . INR 11/20/2016 1.30   Final  . Sodium 11/20/2016 134* 135 - 145 mmol/L Final  . Potassium 11/20/2016 4.0  3.5 - 5.1 mmol/L Final  . Chloride 11/20/2016 101  101 -  111 mmol/L Final  . CO2 11/20/2016 26  22 - 32 mmol/L Final  . Glucose, Bld 11/20/2016 100* 65 - 99 mg/dL Final  . BUN 11/20/2016 18  6 - 20 mg/dL Final  . Creatinine, Ser 11/20/2016 1.26* 0.61 - 1.24 mg/dL Final  . Calcium 11/20/2016 9.1  8.9 - 10.3 mg/dL Final  . Total Protein 11/20/2016 6.9  6.5 - 8.1 g/dL Final  . Albumin 11/20/2016 3.6  3.5 - 5.0 g/dL Final  . AST 11/20/2016 24  15 - 41 U/L Final  . ALT 11/20/2016 40  17 - 63 U/L Final  . Alkaline Phosphatase 11/20/2016 72  38 - 126 U/L Final  . Total Bilirubin 11/20/2016 0.8  0.3 - 1.2 mg/dL Final  . GFR calc non Af Amer 11/20/2016 >60  >60 mL/min Final  . GFR calc Af Amer 11/20/2016 >60  >60 mL/min Final   Comment: (NOTE) The eGFR has been calculated using the CKD EPI equation. This calculation has not been validated in all clinical situations. eGFR's persistently <60 mL/min signify possible Chronic Kidney Disease.   Georgiann Hahn gap 11/20/2016 7  5 - 15 Final        Ardath Sax, MD

## 2016-12-22 LAB — C3 AND C4
COMPLEMENT C3, SERUM: 146 mg/dL (ref 82–167)
COMPLEMENT C4, SERUM: 36 mg/dL (ref 14–44)

## 2016-12-22 LAB — C-REACTIVE PROTEIN: CRP: 8.1 mg/L — AB (ref 0.0–4.9)

## 2016-12-22 LAB — SEDIMENTATION RATE: SED RATE: 31 mm/h — AB (ref 0–30)

## 2016-12-23 LAB — BETA-2-GLYCOPROTEIN I ABS, IGG/M/A
Beta-2 Glyco 1 IgA: <9 GPI IgA units (ref 0–25)
Beta-2 Glycoprotein I Ab, IgG: <9 GPI IgG units (ref 0–20)

## 2016-12-25 LAB — LUPUS ANTICOAGULANT PANEL
DRVVT MIX: 61.2 s — AB (ref 0.0–47.0)
HEXAGONAL PHASE PHOSPHOLIPID: 33 s — AB (ref 0–11)
PTT-LA MIX: 51.7 s — AB (ref 0.0–48.9)
PTT-LA: 59.9 s — AB (ref 0.0–51.9)
dRVVT Confirm: 2.1 ratio — ABNORMAL HIGH (ref 0.8–1.2)
dRVVT: 97.5 s — ABNORMAL HIGH (ref 0.0–47.0)

## 2016-12-25 LAB — CARDIOLIPIN ANTIBODIES, IGG, IGM, IGA
Anticardiolipin Ab,IgA,Qn: <9 APL U/mL (ref 0–11)
Anticardiolipin Ab,IgM,Qn: <9 MPL U/mL (ref 0–12)

## 2016-12-25 LAB — ANTINUCLEAR ANTIBODIES, IFA: ANA Titer 1: NEGATIVE

## 2017-01-02 ENCOUNTER — Ambulatory Visit: Payer: Self-pay | Admitting: Vascular Surgery

## 2017-01-15 ENCOUNTER — Ambulatory Visit (HOSPITAL_BASED_OUTPATIENT_CLINIC_OR_DEPARTMENT_OTHER): Payer: Self-pay | Admitting: Hematology and Oncology

## 2017-01-15 ENCOUNTER — Encounter: Payer: Self-pay | Admitting: Hematology and Oncology

## 2017-01-15 ENCOUNTER — Telehealth: Payer: Self-pay | Admitting: Hematology and Oncology

## 2017-01-15 VITALS — BP 134/69 | HR 62 | Temp 98.5°F | Resp 18 | Ht 66.0 in | Wt 188.1 lb

## 2017-01-15 DIAGNOSIS — M791 Myalgia, unspecified site: Secondary | ICD-10-CM

## 2017-01-15 DIAGNOSIS — R29818 Other symptoms and signs involving the nervous system: Secondary | ICD-10-CM

## 2017-01-15 DIAGNOSIS — I8290 Acute embolism and thrombosis of unspecified vein: Secondary | ICD-10-CM

## 2017-01-15 DIAGNOSIS — Z720 Tobacco use: Secondary | ICD-10-CM

## 2017-01-15 DIAGNOSIS — M2560 Stiffness of unspecified joint, not elsewhere classified: Secondary | ICD-10-CM

## 2017-01-15 DIAGNOSIS — R42 Dizziness and giddiness: Secondary | ICD-10-CM

## 2017-01-15 DIAGNOSIS — Z7901 Long term (current) use of anticoagulants: Secondary | ICD-10-CM

## 2017-01-15 DIAGNOSIS — I82402 Acute embolism and thrombosis of unspecified deep veins of left lower extremity: Secondary | ICD-10-CM

## 2017-01-15 DIAGNOSIS — I2699 Other pulmonary embolism without acute cor pulmonale: Secondary | ICD-10-CM

## 2017-01-15 NOTE — Telephone Encounter (Signed)
Gave avs and calendar for January 2019 °

## 2017-02-03 NOTE — Assessment & Plan Note (Signed)
56 y.o. 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 Patient underwent aggressive intervention with thrombolysis/thrombectomy. Currently doing well on combination of aspirin and Rivaroxaban (Xarelto).  Etiology of the thrombosis is not entirely clear. Likely contributing factor of peripheral arterial disease, but cannot exclude a primary hematological condition such as antiphospholipid antibody syndrome considering patient having history of both venous and arterial thrombosis.  Recent testing for antiphospholipid antibody syndrome was negative for evidence of specific antibodies, positive DRV VT is noted, but is probably related to ongoing rivaroxaban therapy.  Current symptoms of patient is experiencing with orthostatic dizziness is of unclear etiology, but unlikely to be related to rivaroxaban.  Muscle aches are also of unclear etiology at this time.  Plan: --Patient warrants continued close observation --We will obtain CT of the brain without contrast due to renal dysfunction --Continue combination of Rivaroxaban (Xarelto) and antiplatelet therapy indefinitely --Return to clinic in 2 months with repeat DRV VT --We will refer the patient to a Child psychotherapistsocial worker for current lack of insurance.

## 2017-02-03 NOTE — Progress Notes (Signed)
[facility] New Visit:  Assessment: Pulmonary embolism ([place]) 56 y.o. 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 Patient underwent aggressive intervention with thrombolysis/thrombectomy. Currently doing well on combination of aspirin and Rivaroxaban (Xarelto).  Etiology of the thrombosis is not entirely clear. Likely contributing factor of peripheral arterial disease, but cannot exclude a primary hematological condition such as antiphospholipid antibody syndrome considering patient having history of both venous and arterial thrombosis.  Recent testing for antiphospholipid antibody syndrome was negative for evidence of specific antibodies, positive DRV VT is noted, but is probably related to ongoing rivaroxaban therapy.  Current symptoms of patient is experiencing with orthostatic dizziness is of unclear etiology, but unlikely to be related to rivaroxaban.  Muscle aches are also of unclear etiology at this time.  Plan: --Patient warrants continued close observation --We will obtain CT of the brain without contrast due to renal dysfunction --Continue combination of Rivaroxaban (Xarelto) and antiplatelet therapy indefinitely --Return to clinic in 2 months with repeat DRV VT --We will refer the patient to a Education officer, museum for current lack of insurance.  Voice recognition software was used and creation of this note. Despite my best effort at editing the text, some misspelling/errors may have occurred.  Orders Placed This Encounter  Procedures  . CT Head Wo Contrast    Standing Status:   Future    Standing Expiration Date:   01/15/2018    Order Specific Question:   Preferred imaging location?    Answer:   [facility]    Order Specific Question:   Radiology Contrast Protocol - do NOT remove file path    Answer:    [email or web address]    Order Specific Question:   Reason for Exam additional comments    Answer:   Recurrent dizziness  . Lupus anticoagulant panel*    Standing Status:   Future    Standing Expiration Date:   01/15/2018  . Ambulatory referral to Social Work    Referral Priority:   Routine    Referral Type:   Consultation    Referral Reason:   Specialty Services Required    Number of Visits Requested:   1    All questions were answered.  . The patient knows to call the clinic with any problems, questions or concerns.  This note was electronically signed.    History of Presenting Illness Daniel Glover is a 56 y.o. male followed in the [place] for history of DVT and a recent acute arterial thrombosis in the Left LE. 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 Initial presenting symptom at that time was pleuritic chest pain and shortness of breath without associated lower extremity discomfort or swelling. Evaluation included bilateral lower extremity ultrasound revealing distal femoral and popliteal venous clot on the left side as well as CTA of the chest demonstrating bilateral heavy burden of o'clock including main pulmonary artery and complicated by pulmonary infarcts on the left side. Additionally, intra-atrial clot was noted by echocardiogram. Patient was initially treated with intravenous heparin followed by transition to warfarin on which he remained for 1-1.5 years. At that time, patient self discontinued the medication out of frustration with continued need for follow-up. During the primary presentation, patient was evaluated by Dr. Collier Salina  Truddie Coco who has conducted thrombophilia evaluation. Evaluation demonstrated no evidence of defined thrombotic disposition. In particular, patient tested  negative for antiphospholipid antibody syndrome. Additionally, he was negative for factor V Leiden mutation. A panel of cancer markers was also negative including AFP, PSA, CA19-9, and CEA. Patient was doing well in the intervening years without recurrent symptoms of chest pain, shortness of breath, or swelling of the extremities. He has had no history of TIA, stroke, myocardial infarction. His family history is significant for systemic lupus and sarcoidosis in his mother, his sister has had a renal transplant for an unclear diagnosis.  Patient initially presented on 11/12/16 with abrupt development of paresthesia in the left lower extremity followed by rapidly progressive severe pain reaching 10/10 severity. Patient was brought in the hospital for evaluation demonstrated a pulseless left lower extremity. CTA Lt LE was consitent with high-grade stenosis of the proximal left SFA as well as the distal artery with 0 residual flow to the left ankle. Patient underwent emergency left superficial femoral, popliteal, tibial thrombectomy with rapid resolution of symptoms and restoration of palpable pulses after the procedure. Subsequently, patient was started on combination of Rivaroxaban (Xarelto) and aspirin based on our recommendations. Additional thrombosis workup was delayed to allow time for recovery. Patient now returns to the clinic to continue hematological monitoring and to conduct additional assessment.  Patient returns to the clinic for continued monitoring and to review results of recent lab work.  Patient reports taking rivaroxaban regularly.  Complains of dizziness whenever he stands up or bends down.  Also complains of generalized muscle aches on a daily basis and muscle stiffness.  His stiffness is worse in the evening and the best.  Denies any fevers, chills, night sweats.  No significant weight loss.  Appetite appears to be unchanged.  Medical History: Past Medical History:  Diagnosis Date  .  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 PE (pulmonary embolism)   . Pneumonia     Surgical History: Past Surgical History:  Procedure Laterality Date  . APPENDECTOMY    . arm surgery    . THROMBECTOMY FEMORAL ARTERY Left 11/12/2016   Procedure: THROMBECTOMY LEFT SUPERFICIAL FEMORAL ARTERY, LEFT POPLITEAL ARTERY AND LEFT TIBIAL ARTERY ;  Surgeon: Rosetta Posner, MD;  Location: [facility];  Service: Vascular;  Laterality: Left;  . TONSILLECTOMY      Family History: Family History  Problem Relation Age of Onset  . Lupus Mother   . Bleeding Disorder Father   . Hypertension Brother     Social History: Social History   Socioeconomic History  . Marital status: Single    Spouse name: Not on file  . Number of children: Not on file  . Years of education: Not on file  . Highest education level: Not on file  Social Needs  . Financial resource strain: Not on file  . Food insecurity - worry: Not on file  . Food insecurity - inability: Not on file  . Transportation needs - medical: Not on file  . Transportation needs - non-medical: Not on file  Occupational History  . Not on file  Tobacco Use  . Smoking status: Current Every Day Smoker    Packs/day: 0.25    Years: 15.00    Pack years: 3.75    Types: Cigarettes  . Smokeless tobacco: Never Used  Substance and Sexual Activity  . Alcohol use: Yes    Comment: occassionally  . Drug use: No  .  Sexual activity: Not on file  Other Topics Concern  . Not on file  Social History Narrative  . Not on file    Allergies: No Known Allergies  Medications:  Current Outpatient Medications  Medication Sig Dispense Refill  . aspirin EC 81 MG tablet Take 1 tablet (81 mg total) by mouth daily.    . rivaroxaban (XARELTO) 20 MG TABS tablet Take 1 tablet (20 mg total) by mouth daily with supper. 30 tablet 6   No current facility-administered medications for this visit.     Review of Systems: Review of  Systems  All other systems reviewed and are negative.    PHYSICAL EXAMINATION Blood pressure 134/69, pulse 62, temperature 98.5 F (36.9 C), temperature source Oral, resp. rate 18, height '5\' 6"'$  (1.676 m), weight 188 lb 1.6 oz (85.3 kg), SpO2 98 %.  ECOG PERFORMANCE STATUS: 1 - Symptomatic but completely ambulatory  Physical Exam  Constitutional: He is oriented to person, place, and time and well-developed, well-nourished, and in no distress. No distress.  HENT:  Head: Normocephalic and atraumatic.  Mouth/Throat: Oropharynx is clear and moist. No oropharyngeal exudate.  Eyes: Conjunctivae and EOM are normal. Pupils are equal, round, and reactive to light. No scleral icterus.  Neck: No tracheal deviation present. No thyromegaly present.  Cardiovascular: Normal rate, regular rhythm, normal heart sounds and intact distal pulses.  No murmur heard. Pulmonary/Chest: Effort normal and breath sounds normal. No respiratory distress. He has no wheezes. He has no rales.  Abdominal: Soft. Bowel sounds are normal. He exhibits no distension and no mass. There is no tenderness. There is no rebound and no guarding.  Musculoskeletal: Normal range of motion. He exhibits no edema or tenderness.  Lymphadenopathy:    He has no cervical adenopathy.  Neurological: He is alert and oriented to person, place, and time. He displays normal reflexes. No cranial nerve deficit.  Skin: Skin is warm and dry. No rash noted. He is not diaphoretic. No erythema.     LABORATORY DATA: I have personally reviewed the data as listed: No visits with results within 1 Week(s) from this visit.  Latest known visit with results is:  Appointment on 12/21/2016  Component Date Value Ref Range Status  . WBC 12/21/2016 6.1  4.0 - 10.3 10e3/uL Final  . NEUT# 12/21/2016 3.4  1.5 - 6.5 10e3/uL Final  . HGB 12/21/2016 14.9  13.0 - 17.1 g/dL Final  . HCT 12/21/2016 43.7  38.4 - 49.9 % Final  . Platelets 12/21/2016 237  140 - 400  10e3/uL Final  . MCV 12/21/2016 88.3  79.3 - 98.0 fL Final  . MCH 12/21/2016 30.1  27.2 - 33.4 pg Final  . MCHC 12/21/2016 34.1  32.0 - 36.0 g/dL Final  . RBC 12/21/2016 4.95  4.20 - 5.82 10e6/uL Final  . RDW 12/21/2016 14.1  11.0 - 14.6 % Final  . lymph# 12/21/2016 1.9  0.9 - 3.3 10e3/uL Final  . MONO# 12/21/2016 0.5  0.1 - 0.9 10e3/uL Final  . Eosinophils Absolute 12/21/2016 0.2  0.0 - 0.5 10e3/uL Final  . Basophils Absolute 12/21/2016 0.0  0.0 - 0.1 10e3/uL Final  . NEUT% 12/21/2016 56.0  39.0 - 75.0 % Final  . LYMPH% 12/21/2016 31.4  14.0 - 49.0 % Final  . MONO% 12/21/2016 8.9  0.0 - 14.0 % Final  . EOS% 12/21/2016 3.4  0.0 - 7.0 % Final  . BASO% 12/21/2016 0.3  0.0 - 2.0 % Final  . Sodium 12/21/2016 139  136 -  145 mEq/L Final  . Potassium 12/21/2016 4.1  3.5 - 5.1 mEq/L Final  . Chloride 12/21/2016 104  98 - 109 mEq/L Final  . CO2 12/21/2016 26  22 - 29 mEq/L Final  . Glucose 12/21/2016 123  70 - 140 mg/dl Final   Glucose reference range is for nonfasting patients. Fasting glucose reference range is 70- 100.  Marland Kitchen BUN 12/21/2016 13.0  7.0 - 26.0 mg/dL Final  . Creatinine 12/21/2016 1.1  0.7 - 1.3 mg/dL Final  . Total Bilirubin 12/21/2016 0.87  0.20 - 1.20 mg/dL Final  . Alkaline Phosphatase 12/21/2016 80  40 - 150 U/L Final  . AST 12/21/2016 15  5 - 34 U/L Final  . ALT 12/21/2016 17  0 - 55 U/L Final  . Total Protein 12/21/2016 7.5  6.4 - 8.3 g/dL Final  . Albumin 12/21/2016 3.8  3.5 - 5.0 g/dL Final  . Calcium 12/21/2016 9.6  8.4 - 10.4 mg/dL Final  . Anion Gap 12/21/2016 10  3 - 11 mEq/L Final  . EGFR 12/21/2016 >60  >60 ml/min/1.73 m2 Final   eGFR is calculated using the CKD-EPI Creatinine Equation (2009)  . LDH 12/21/2016 179  125 - 245 U/L Final  . Sedimentation Rate-Westergren 12/21/2016 31* 0 - 30 mm/hr Final  . CRP 12/21/2016 8.1* 0.0 - 4.9 mg/L Final  . Complement C3, Serum 12/21/2016 146  82 - 167 mg/dL Final  . Complement C4, Serum 12/21/2016 36  14 - 44 mg/dL Final   . ANA Titer 1 12/21/2016 Negative   Final   Comment:                                                     Negative   <1:80                                                     Borderline  1:80                                                     Positive   >1:80   . Beta-2 Glycoprotein I Ab, IgG 12/21/2016 <9  0 - 20 GPI IgG units Final   Comment: The reference interval reflects a 3SD or 99th percentile interval, which is thought to represent a potentially clinically significant result in accordance with the International Consensus Statement on the classification criteria for definitive antiphospholipid syndrome (APS). J Thromb Haem 2006;4:295-306.   . Beta-2 Glyco 1 IgA 12/21/2016 <9  0 - 25 GPI IgA units Final   Comment: The reference interval reflects a 3SD or 99th percentile interval, which is thought to represent a potentially clinically significant result in accordance with the International Consensus Statement on the classification criteria for definitive antiphospholipid syndrome (APS). J Thromb Haem 2006;4:295-306.   . Beta-2 Glyco 1 IgM 12/21/2016 <9  0 - 32 GPI IgM units Final   Comment: The reference interval reflects a 3SD or 99th percentile interval, which is thought to represent a potentially clinically significant result in accordance  with the International Consensus Statement on the classification criteria for definitive antiphospholipid syndrome (APS). J Thromb Haem 2006;4:295-306.   Marland Kitchen Anticardiolipin Ab,IgG,Qn 12/21/2016 <9  0 - 14 GPL U/mL Final   Comment:                                          Negative:              <15                                          Indeterminate:     15 - 20                                          Low-Med Positive: >20 - 80                                          High Positive:         >80   . Anticardiolipin Ab,IgM,Qn 12/21/2016 <9  0 - 12 MPL U/mL Final   Comment:                                          Negative:               <13                                          Indeterminate:     13 - 20                                          Low-Med Positive: >20 - 80                                          High Positive:         >80   . Anticardiolipin Ab,IgA,Qn 12/21/2016 <9  0 - 11 APL U/mL Final   Comment:                                          Negative:              <12                                          Indeterminate:     12 - 20  Low-Med Positive: >20 - 80                                          High Positive:         >80   . PTT-LA 12/21/2016 59.9* 0.0 - 51.9 sec Final  . PTT-LA Mix 12/21/2016 51.7* 0.0 - 48.9 sec Final  . Hexagonal Phase Phospholipid 12/21/2016 33* 0 - 11 sec Final  . dRVVT 12/21/2016 97.5* 0.0 - 47.0 sec Final  . dRVVT Mix 12/21/2016 61.2* 0.0 - 47.0 sec Final  . dRVVT Confirm 12/21/2016 2.1* 0.8 - 1.2 ratio Final  . Lupus Reflex Interpretation 12/21/2016 Comment:   Final   Comment: Results are consistent with the presence of a lupus anticoagulant. NOTE: Only persistent lupus anticoagulants are thought to be of clinical significance. For this reason, repeat testing in 12 or more weeks after an initial positive result should be considered to confirm or refute the presence of a lupus anticoagulant, depending on clinical presentation. Results of lupus anticoagulant tests may be falsely positive in the presence of certain anticoagulant therapies.         Ardath Sax, MD

## 2017-02-12 ENCOUNTER — Ambulatory Visit (HOSPITAL_COMMUNITY): Admission: RE | Admit: 2017-02-12 | Payer: Self-pay | Source: Ambulatory Visit

## 2017-03-22 ENCOUNTER — Ambulatory Visit: Payer: Self-pay | Admitting: Hematology and Oncology

## 2017-03-22 ENCOUNTER — Other Ambulatory Visit: Payer: Self-pay

## 2017-03-23 ENCOUNTER — Telehealth: Payer: Self-pay | Admitting: Hematology and Oncology

## 2017-03-23 NOTE — Telephone Encounter (Signed)
Spoke with patient regarding Rescheduling appointment that he missed on 1/24 per sched message 1/25

## 2017-03-27 ENCOUNTER — Inpatient Hospital Stay: Payer: Self-pay | Attending: Hematology and Oncology

## 2017-03-27 ENCOUNTER — Encounter: Payer: Self-pay | Admitting: *Deleted

## 2017-03-27 ENCOUNTER — Telehealth: Payer: Self-pay

## 2017-03-27 ENCOUNTER — Inpatient Hospital Stay (HOSPITAL_BASED_OUTPATIENT_CLINIC_OR_DEPARTMENT_OTHER): Payer: Self-pay | Admitting: Hematology and Oncology

## 2017-03-27 ENCOUNTER — Ambulatory Visit (HOSPITAL_COMMUNITY)
Admission: RE | Admit: 2017-03-27 | Discharge: 2017-03-27 | Disposition: A | Discharge status: Home / Self Care | Payer: No Typology Code available for payment source | Source: Ambulatory Visit | Attending: Hematology and Oncology | Admitting: Hematology and Oncology

## 2017-03-27 VITALS — BP 148/76 | HR 57 | Temp 97.8°F | Resp 18 | Ht 66.0 in | Wt 186.8 lb

## 2017-03-27 DIAGNOSIS — I739 Peripheral vascular disease, unspecified: Secondary | ICD-10-CM

## 2017-03-27 DIAGNOSIS — I8290 Acute embolism and thrombosis of unspecified vein: Secondary | ICD-10-CM

## 2017-03-27 DIAGNOSIS — M79605 Pain in left leg: Secondary | ICD-10-CM

## 2017-03-27 DIAGNOSIS — I82402 Acute embolism and thrombosis of unspecified deep veins of left lower extremity: Secondary | ICD-10-CM | POA: Insufficient documentation

## 2017-03-27 DIAGNOSIS — I2699 Other pulmonary embolism without acute cor pulmonale: Secondary | ICD-10-CM | POA: Insufficient documentation

## 2017-03-27 DIAGNOSIS — Z720 Tobacco use: Secondary | ICD-10-CM

## 2017-03-27 MED ORDER — ELIQUIS 5 MG VTE STARTER PACK: ORAL_TABLET | ORAL | 0 refills | Status: DC

## 2017-03-27 MED ORDER — RIVAROXABAN (XARELTO) VTE STARTER PACK (15 & 20 MG): ORAL_TABLET | ORAL | 0 refills | Status: DC

## 2017-03-27 MED FILL — ELIQUIS STARTER PACK 5 MG T: 5 | 30 days supply | Qty: 74 | Fill #0

## 2017-03-27 NOTE — Telephone Encounter (Signed)
Gave patient avs and calender of upcoming appointment. Made appt. For doppler today @ 1pm W/L. Already had seen patient. Per 1/29 los

## 2017-03-27 NOTE — Progress Notes (Signed)
[facility] Clinical Social Work  Holiday representative received referral from Futures trader for financial concerns and medication resources.  CSW met with patient in [place] office at [facility] to offer support and assess for needs.  Patient stated he was uninsured and has not worked since mid September.  Patient stated he was unable to afford his medication, but has not applied for Medicaid.  Patient stated he received a 30 day free card for his medication when he was discharged from the hospital, but has not attempted to refill his prescription or identify other resources.  CSW provided patient with a Medicaid application and encouraged patient to apply as soon as possible.  Patient stated he planned to go to social services this week to apply.  CSW contacted the [facility] to explore other options.  Pharmacy identified a 30 day free option for a similar medication.  CSW shared this information with physician.  There are possible additional assistance programs through the drug company, but they require patient to explore all other resources; including applying for medicaid.  CSW also spoke with the financial advocate who will assist in drug company resources.  Patient will go to outpatient pharmacy to fill medication using the assistance program (30 day free starter program).   Johnnye Lana, MSW, LCSW, OSW-C Clinical Social Worker [facility] [phone]

## 2017-03-27 NOTE — Progress Notes (Signed)
Left lower extremity venous duplex has been completed. Negative for DVT. Results were given to Dr. Gweneth DimitriPerlov.  03/27/17 1:16 PM Olen CordialGreg Senna Lape RVT

## 2017-03-28 LAB — LUPUS ANTICOAGULANT PANEL
DRVVT: 46.9 s (ref 0.0–47.0)
PTT LA: 41.9 s (ref 0.0–51.9)

## 2017-03-29 ENCOUNTER — Encounter: Payer: Self-pay | Admitting: Vascular Surgery

## 2017-03-29 ENCOUNTER — Ambulatory Visit (INDEPENDENT_AMBULATORY_CARE_PROVIDER_SITE_OTHER): Payer: Self-pay | Admitting: Vascular Surgery

## 2017-03-29 ENCOUNTER — Other Ambulatory Visit: Payer: Self-pay

## 2017-03-29 VITALS — BP 132/82 | HR 56 | Resp 20 | Ht 66.0 in | Wt 189.0 lb

## 2017-03-29 DIAGNOSIS — I739 Peripheral vascular disease, unspecified: Secondary | ICD-10-CM

## 2017-03-29 DIAGNOSIS — I8290 Acute embolism and thrombosis of unspecified vein: Secondary | ICD-10-CM

## 2017-03-29 MED ORDER — CEPHALEXIN 500 MG PO CAPS: 500.0000 mg | ORAL_CAPSULE | Freq: Three times a day (TID) | ORAL | 0 refills | Status: DC

## 2017-03-29 NOTE — Progress Notes (Addendum)
Vascular and Vein Specialist of [place]  Patient name: Daniel Glover MRN: [id number] DOB: 05/10/1960 Sex: male  REASON FOR VISIT: Follow-up left leg thrombectomy from September 2018  HPI: Daniel Glover is a 57 y.o. male here today for follow-up.  He had a prior history of DVT and was to be on anticoagulation.  He had presented in September with an acute arterial thrombus of his left superficial femoral popliteal and tibial vessels.  He was taken to the operating room and underwent uneventful thrombectomy from the below-knee popliteal exposure on 11/12/2016.  He did well initially.  He did have separation of his below-knee popliteal incision with serous drainage.  On my last visit with him in October this was granulating although did have a fair amount.  He did not report for follow-up.  He was recently seen by his hematologist and was concerned regarding the left calf wound and he was having some recurrent pain and is seen today to assure no arterial insufficiency  Past Medical History:  Diagnosis Date  . 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 PE (pulmonary embolism)   . Pneumonia     Family History  Problem Relation Age of Onset  . Lupus Mother   . Bleeding Disorder Father   . Hypertension Brother     SOCIAL HISTORY: Social History   Tobacco Use  . Smoking status: Current Every Day Smoker    Packs/day: 0.25    Years: 15.00    Pack years: 3.75    Types: Cigarettes  . Smokeless tobacco: Never Used  Substance Use Topics  . Alcohol use: Yes    Comment: occassionally    No Known Allergies  Current Outpatient Medications  Medication Sig Dispense Refill  . aspirin EC 81 MG tablet Take 1 tablet (81 mg total) by mouth daily.    Marland Kitchen. ELIQUIS STARTER PACK (ELIQUIS STARTER PACK) 5 MG TABS Take as directed on package: start with two-5mg  tablets twice daily for 7 days. On day 8, switch to one-5mg  tablet twice daily.  1 each 0   No current facility-administered medications for this visit.     REVIEW OF SYSTEMS:  [X]  denotes positive finding, [ ]  denotes negative finding Cardiac  Comments:  Chest pain or chest pressure:    Shortness of breath upon exertion:    Short of breath when lying flat:    Irregular heart rhythm:        Vascular    Pain in calf, thigh, or hip brought on by ambulation:    Pain in feet at night that wakes you up from your sleep:     Blood clot in your veins: x   Leg swelling:           PHYSICAL EXAM: Vitals:   03/29/17 0932  BP: 132/82  Pulse: (!) 56  Resp: 20  SpO2: 98%  Weight: 189 lb (85.7 kg)  Height: 5\' 6"  (1.676 m)    GENERAL: The patient is a well-nourished male, in no acute distress. The vital signs are documented above. CARDIOVASCULAR: 2+ dorsalis pedis pulses on the right.  3+ posterior tibial pulse on the left.    PULMONARY: There is good air exchange  MUSCULOSKELETAL: There are no major deformities or cyanosis. NEUROLOGIC: No focal weakness or paresthesias are detected. SKIN: Healing of his incision on the medial aspect of his left calf.  He has a very unusual appearing raised rash with some serous drainage over  a large area from the initial initial incision down onto his medial calf.  This does have punctate areas with some serous drainage. PSYCHIATRIC: The patient has a normal affect.  DATA:  Duplex on 03/27/2017 showing no DVT  MEDICAL ISSUES: No evidence of arterial insufficiency bilaterally.  I am unclear as to the cause of these raised rash over his medial calf.  May have developed a infection in the skin at the area of his old incision.  We will start him on Keflex 500 mg 3 times daily and will see him back in 2 weeks.  May require dermatology or ID evaluation as well.    [name], MD FACS [facility] Tel [phone] Pager [phone]

## 2017-04-10 ENCOUNTER — Encounter: Payer: Self-pay | Admitting: Vascular Surgery

## 2017-04-10 ENCOUNTER — Ambulatory Visit (INDEPENDENT_AMBULATORY_CARE_PROVIDER_SITE_OTHER): Payer: Self-pay | Admitting: Vascular Surgery

## 2017-04-10 VITALS — BP 124/83 | HR 72 | Temp 97.6°F | Resp 16 | Ht 66.0 in | Wt 186.0 lb

## 2017-04-10 DIAGNOSIS — R21 Rash and other nonspecific skin eruption: Secondary | ICD-10-CM

## 2017-04-10 DIAGNOSIS — I739 Peripheral vascular disease, unspecified: Secondary | ICD-10-CM

## 2017-04-10 DIAGNOSIS — I8290 Acute embolism and thrombosis of unspecified vein: Secondary | ICD-10-CM

## 2017-04-10 NOTE — Progress Notes (Signed)
History of Present Illness:  Patient is a 57 y.o. year old male who presents for follow up.  He had a prior history of DVT and was to be on anticoagulation.  He had presented in September with an acute arterial thrombus of his left superficial femoral popliteal and tibial vessels.  He was taken to the operating room and underwent uneventful thrombectomy from the below-knee popliteal exposure on 11/12/2016.  He did well initially.   He was seen by Dr. Arbie Cookey on 03/29/2017 for rash below his lower leg incision.  The incision is fully healed.  He was given Keflex 500 mg TID for 2 weeks.  He is here today for follow exam of the rash.  He states he uses dial soap and washes the area well.  He keeps a dry dressing over it when he is out and about.  The rash is not better and no worse according to the patient.  It does itch at times.    Past Medical History:  Diagnosis Date  . 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 PE (pulmonary embolism)   . Pneumonia     Past Surgical History:  Procedure Laterality Date  . APPENDECTOMY    . arm surgery    . THROMBECTOMY FEMORAL ARTERY Left 11/12/2016   Procedure: THROMBECTOMY LEFT SUPERFICIAL FEMORAL ARTERY, LEFT POPLITEAL ARTERY AND LEFT TIBIAL ARTERY ;  Surgeon: [name], MD;  Location: [facility] OR;  Service: Vascular;  Laterality: Left;  . TONSILLECTOMY       Social History Social History   Tobacco Use  . Smoking status: Current Every Day Smoker    Packs/day: 0.25    Years: 15.00    Pack years: 3.75    Types: Cigarettes  . Smokeless tobacco: Never Used  Substance Use Topics  . Alcohol use: Yes    Comment: occassionally  . Drug use: No    Family History Family History  Problem Relation Age of Onset  . Lupus Mother   . Bleeding Disorder Father   . Hypertension Brother     Allergies  No Known Allergies   Current Outpatient Medications  Medication Sig Dispense Refill  . aspirin EC 81 MG tablet Take  1 tablet (81 mg total) by mouth daily.    . cephALEXin (KEFLEX) 500 MG capsule Take 1 capsule (500 mg total) by mouth 3 (three) times daily. 50 capsule 0  . ELIQUIS STARTER PACK (ELIQUIS STARTER PACK) 5 MG TABS Take as directed on package: start with two-5mg  tablets twice daily for 7 days. On day 8, switch to one-5mg  tablet twice daily. 1 each 0   No current facility-administered medications for this visit.     ROS:   General:  No weight loss, Fever, chills  HEENT: No recent headaches, no nasal bleeding, no visual changes, no sore throat  Neurologic: No dizziness, blackouts, seizures. No recent symptoms of stroke or mini- stroke. No recent episodes of slurred speech, or temporary blindness.  Cardiac: No recent episodes of chest pain/pressure, no shortness of breath at rest.  No shortness of breath with exertion.  Denies history of atrial fibrillation or irregular heartbeat  Vascular: No history of rest pain in feet.  No history of claudication.  No history of non-healing ulcer, No history of DVT   Pulmonary: No home oxygen, no productive cough, no hemoptysis,  No asthma or wheezing  Musculoskeletal:  [ ]  Arthritis, [ ]  Low back pain,  [ ]   Joint pain  Hematologic:No history of hypercoagulable state.  No history of easy bleeding.  No history of anemia  Gastrointestinal: No hematochezia or melena,  No gastroesophageal reflux, no trouble swallowing  Urinary: [ ]  chronic Kidney disease, [ ]  on HD - [ ]  MWF or [ ]  TTHS, [ ]  Burning with urination, [ ]  Frequent urination, [ ]  Difficulty urinating;   Skin:  rash inferior to left popliteal incisionn  Psychological: No history of anxiety,  No history of depression   Physical Examination  Vitals:   04/10/17 1357  BP: 124/83  Pulse: 72  Resp: 16  Temp: 97.6 F (36.4 C)  SpO2: 98%  Weight: 186 lb (84.4 kg)  Height: 5\' 6"  (1.676 m)    Body mass index is 30.02 kg/m.  General:  Alert and oriented, no acute distress HEENT: Normal,  normocephalic  Neck: No bruit  Pulmonary: Clear to auscultation bilaterally Cardiac: Regular Rate and Rhythm without murmur Gastrointestinal: Soft, non-tender, non-distended, no mass, no scars Skin:  Rash , white petechial spots on B LE's and UE.    Extremity Pulses:  2+ radial, brachial, DP/PT pulses bilaterally Musculoskeletal: No deformity or edema  Neurologic: Upper and lower extremity motor 5/5 and symmetric    ASSESSMENT:  Hx of DVT  Acute arterial thrombus of his left superficial femoral popliteal and tibial vessels s/p thrombectomy.   PLAN: He has palpable pulses bilateral LE.  No apparent edema in bilateral LE. He is on aspirin 81 mg daily and Eliquis daily.  At this time the oral Keflex did not seem to make a difference in the rash.  We will refer him to a dermatologist for further evaluation and treatment of the rash.  In the mean time we recommend continue use with dial soap and keeping the area clean and dry.  He will f/u PRN.  Mosetta PigeonEmma Maureen Glenroy Crossen PA-C [facility]  Patient was seen in conjunction with Dr. Arbie CookeyEarly today  I have examined the patient, reviewed and agree with above.  No improvement with Keflex.  Unclear as to what is causing the skin changes.  Will refer to dermatology.  Explained may be somewhat difficult getting a dermatologic evaluation with no insurance.  Will see us as needed  Gretta Beganodd Early, MD 04/10/2017 2:52 PM

## 2017-04-15 ENCOUNTER — Encounter: Payer: Self-pay | Admitting: Hematology and Oncology

## 2017-04-15 DIAGNOSIS — I8290 Acute embolism and thrombosis of unspecified vein: Secondary | ICD-10-CM | POA: Insufficient documentation

## 2017-04-15 DIAGNOSIS — M79605 Pain in left leg: Secondary | ICD-10-CM | POA: Insufficient documentation

## 2017-04-15 NOTE — Progress Notes (Signed)
Daniel Glover Cancer New Visit:  Assessment: VTE (venous thromboembolism) 57 y.o. male with previous history Glover unprovoked vein thrombosis in the right lower Glover, Daniel Glover the thrombosis is not entirely clear. Likely contributing factor Glover peripheral arterial disease, but cannot exclude a primary hematological condition such as antiphospholipid antibody syndrome considering patient having history Glover both venous and arterial thrombosis.  Recent testing for antiphospholipid antibody syndrome was negative for evidence Glover specific antibodies, positive DRV VT is noted, but is probably related to ongoing rivaroxaban therapy.  Patient has not been compliant with his follow-ups with vascular surgery or with his anticoagulant therapy since the last visit to the clinic due to financial constraints.  Present symptoms are not consistent with arterial occlusions patient's distal Glover remains warm and appears to be well perfused, but cannot exclude infection or venous thrombosis recurrence.  Plan: -Labs today including DRV VT testing as patient is off therapeutic anticoagulation. -Doppler left lower Glover to assess for venous clot - Consult social worker to figure out a way for patient to return to taking rivaroxaban.  Due to duration Glover interruption, patient will need to receive loading dosing again. -Recommended patient to return to vascular surgery to resume follow-up. -Return to my clinic in 1 month for continued monitoring.  Voice recognition software was used and creation Glover this note. Despite my best effort at editing the text, some misspelling/errors may have occurred.  No orders Glover the defined types were placed in this  encounter.   All questions were answered.  . The patient knows to call the clinic with any problems, questions or concerns.  This note was electronically signed.    History Glover Presenting Illness Daniel Glover is a 57 y.o. male followed in the Cancer Glover for history Glover DVT and a recent acute arterial thrombosis in the Left LE. 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 Initial presenting symptom at that time was pleuritic chest pain and shortness Glover breath without associated lower Glover discomfort or swelling. Evaluation included bilateral lower Glover ultrasound revealing distal femoral and popliteal venous clot on the left side as well as CTA Glover the chest demonstrating bilateral heavy burden Glover o'clock including main pulmonary artery and complicated by pulmonary infarcts on the left side. Additionally, intra-atrial clot was noted by echocardiogram. Patient was initially treated with intravenous heparin followed by transition to warfarin on which he remained for 1-1.5 years. At that time, patient self discontinued the medication out Glover frustration with continued need for follow-up. During the primary presentation, patient was evaluated by Dr. Pierce CranePeter Glover who has conducted thrombophilia evaluation. Evaluation demonstrated no evidence Glover defined thrombotic disposition. In particular, patient tested negative for antiphospholipid antibody syndrome. Additionally, he was negative for factor V Leiden mutation. A panel Glover cancer markers was also negative including AFP, PSA, CA19-9, and CEA. Patient was doing well in the intervening years without recurrent symptoms Glover chest pain, shortness Glover breath, or swelling Glover the extremities. He has had no history Glover TIA, stroke, myocardial infarction. His family history is significant for systemic  lupus and sarcoidosis in his mother, his sister has had a renal transplant for an unclear diagnosis.  Patient  initially presented on 11/12/16 with abrupt development Glover paresthesia in the left lower Glover followed by rapidly progressive severe pain reaching 10/10 severity. Patient was brought in the hospital for evaluation demonstrated a pulseless left lower Glover. CTA Lt LE was consitent with high-grade stenosis Glover the proximal left SFA as well as the distal artery with 0 residual flow to the left ankle. Patient underwent emergency left superficial femoral, popliteal, tibial thrombectomy with rapid resolution Glover symptoms and restoration Glover palpable pulses after the procedure. Subsequently, patient was started on combination Glover Rivaroxaban (Xarelto) and aspirin based on our recommendations. Additional thrombosis workup was delayed to allow time for recovery. Patient now returns to the clinic to continue hematological monitoring and to conduct additional assessment.  Patient returns to the clinic for continued monitoring and to review results Glover recent lab work.  Patient reports not taking rivaroxaban since the last visit in our clinic.  Patient also has not been compliant with his follow-up with vascular surgery clinic.  In the interim, patient reports increasing pain in the left lower Glover mainly on top Glover the foot and in the middle Glover the upper leg.  He does not have direct relation with activity.  He reports cutaneous changes in the area Glover the previous surgical incision.  Occasional recurrent drainage Glover clear fluid, no pus, no bleeding.  Denies any fevers, chills, night sweats.  Medical History: Past Medical History:  Diagnosis Date  . 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 PE (pulmonary embolism)   . Pneumonia     Surgical History: Past Surgical History:  Procedure Laterality Date  . APPENDECTOMY    . arm surgery    . THROMBECTOMY FEMORAL  ARTERY Left 11/12/2016   Procedure: THROMBECTOMY LEFT SUPERFICIAL FEMORAL ARTERY, LEFT POPLITEAL ARTERY AND LEFT TIBIAL ARTERY ;  Surgeon: [name], MD;  Location: [facility] OR;  Service: Vascular;  Laterality: Left;  . TONSILLECTOMY      Family History: Family History  Problem Relation Age Glover Onset  . Lupus Mother   . Bleeding Disorder Father   . Hypertension Brother     Social History: Social History   Socioeconomic History  . Marital status: Single    Spouse name: Not on file  . Number Glover children: Not on file  . Years Glover education: Not on file  . Highest education level: Not on file  Social Needs  . Financial resource strain: Not on file  . Food insecurity - worry: Not on file  . Food insecurity - inability: Not on file  . Transportation needs - medical: Not on file  . Transportation needs - non-medical: Not on file  Occupational History  . Not on file  Tobacco Use  . Smoking status: Current Every Day Smoker    Packs/day: 0.25    Years: 15.00    Pack years: 3.75    Types: Cigarettes  . Smokeless tobacco: Never Used  Substance and Sexual Activity  . Alcohol use: Yes    Comment: occassionally  . Drug use: No  . Sexual activity: Not on file  Other Topics Concern  . Not on file  Social History Narrative  . Not on file    Allergies: No Known Allergies  Medications:  Current Outpatient Medications  Medication Sig Dispense Refill  . aspirin EC 81 MG tablet Take 1 tablet (81 mg total) by mouth daily.    . cephALEXin (KEFLEX) 500 MG capsule Take 1 capsule (  500 mg total) by mouth 3 (three) times daily. 50 capsule 0  . ELIQUIS STARTER PACK (ELIQUIS STARTER PACK) 5 MG TABS Take as directed on package: start with two-5mg  tablets twice daily for 7 days. On day 8, switch to one-5mg  tablet twice daily. 1 each 0   No current facility-administered medications for this visit.     Review Glover Systems: Review Glover Systems  Musculoskeletal: Positive for myalgias.  All other  systems reviewed and are negative.    PHYSICAL EXAMINATION Blood pressure (!) 148/76, pulse (!) 57, temperature 97.8 F (36.6 C), temperature source Oral, resp. rate 18, height 5\' 6"  (1.676 m), weight 186 lb 12.8 oz (84.7 kg), SpO2 100 %.  ECOG PERFORMANCE STATUS: 2 - Symptomatic, <50% confined to bed  Physical Exam  Constitutional: He is oriented to person, place, and time and well-developed, well-nourished, and in no distress. No distress.  HENT:  Head: Normocephalic and atraumatic.  Mouth/Throat: Oropharynx is clear and moist. No oropharyngeal exudate.  Eyes: Conjunctivae and EOM are normal. Pupils are equal, round, and reactive to light. No scleral icterus.  Neck: No tracheal deviation present. No thyromegaly present.  Cardiovascular: Normal rate, regular rhythm, normal heart sounds and intact distal pulses.  No murmur heard. Pulmonary/Chest: Effort normal and breath sounds normal. No respiratory distress. He has no wheezes. He has no rales.  Abdominal: Soft. Bowel sounds are normal. He exhibits no distension and no mass. There is no tenderness. There is no rebound and no guarding.  Musculoskeletal: Normal range Glover motion. He exhibits no edema or tenderness.  Left lower Glover without significant swelling, cyanosis, or pallor.  Palpable pedal and posterior tibialis pulses.  The area Glover previous surgery is changed with no dehiscent wound and no expressible drainage.  No palpable fluid collection, but skin appears to be discolored with scaling and thickening.  Lymphadenopathy:    He has no cervical adenopathy.  Neurological: He is alert and oriented to person, place, and time. He displays normal reflexes. No cranial nerve deficit.  Skin: Skin is warm and dry. No rash noted. He is not diaphoretic. No erythema.     LABORATORY DATA: I have personally reviewed the data as listed: Appointment on 03/27/2017  Component Date Value Ref Range Status  . PTT Lupus Anticoagulant 03/27/2017  41.9  0.0 - 51.9 sec Final  . DRVVT 03/27/2017 46.9  0.0 - 47.0 sec Final  . Lupus Anticoag Interp 03/27/2017 Comment:   Corrected   Comment: (NOTE) No lupus anticoagulant was detected. Performed At: [facility] [address] [place], [place] [id number] Jolene Schimke MD JX:[id number] Performed at [facility], [address], [place], [place] [address]         Daisy Blossom, MD

## 2017-04-15 NOTE — Assessment & Plan Note (Signed)
57 y.o. 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 Patient underwent aggressive intervention with thrombolysis/thrombectomy. Currently doing well on combination of aspirin and Rivaroxaban (Xarelto).  Etiology of the thrombosis is not entirely clear. Likely contributing factor of peripheral arterial disease, but cannot exclude a primary hematological condition such as antiphospholipid antibody syndrome considering patient having history of both venous and arterial thrombosis.  Recent testing for antiphospholipid antibody syndrome was negative for evidence of specific antibodies, positive DRV VT is noted, but is probably related to ongoing rivaroxaban therapy.  Patient has not been compliant with his follow-ups with vascular surgery or with his anticoagulant therapy since the last visit to the clinic due to financial constraints.  Present symptoms are not consistent with arterial occlusions patient's distal extremity remains warm and appears to be well perfused, but cannot exclude infection or venous thrombosis recurrence.  Plan: -Labs today including DRV VT testing as patient is off therapeutic anticoagulation. -Doppler left lower extremity to assess for venous clot - Consult social worker to figure out a way for patient to return to taking rivaroxaban.  Due to duration of interruption, patient will need to receive loading dosing again. -Recommended patient to return to vascular surgery to resume follow-up. -Return to my clinic in 1 month for continued monitoring.

## 2017-04-19 ENCOUNTER — Telehealth: Payer: Self-pay | Admitting: Vascular Surgery

## 2017-04-19 NOTE — Telephone Encounter (Signed)
Spoke to patient in regards to the Dermatology referral and advised him that [facility] with no [facility] but that he would have to have $250 up front cost, to which he stated he can not do at this time.  I then advised him of [facility] that offer this type of care at a discount or for free, depending on his status. Gave the contact info to those facilities.   Spoke to Dr Early about this information as well.

## 2017-04-26 ENCOUNTER — Telehealth: Payer: Self-pay

## 2017-04-26 ENCOUNTER — Ambulatory Visit: Payer: Self-pay | Admitting: Hematology and Oncology

## 2017-04-26 NOTE — Telephone Encounter (Signed)
Patient was called because he did not show for his 1:40 appointment.  Patient stated he called at 1:35 to let us know that he overslept and a scheduling message will be sent to have this appointment rescheduled.

## 2017-04-30 ENCOUNTER — Telehealth: Payer: Self-pay | Admitting: Hematology and Oncology

## 2017-04-30 NOTE — Telephone Encounter (Signed)
Spoke with patient and rescheduled his appt per 2/28 sch msg

## 2017-05-03 ENCOUNTER — Ambulatory Visit: Payer: Self-pay | Admitting: Hematology and Oncology

## 2017-05-07 ENCOUNTER — Telehealth: Payer: Self-pay

## 2017-05-07 ENCOUNTER — Ambulatory Visit: Payer: Self-pay | Admitting: Hematology and Oncology

## 2017-05-07 NOTE — Telephone Encounter (Signed)
Patient came in early and canceled his appointment and r/s it for next Monday. Printed a calender of the new appointment date. Per 3/11 los

## 2017-05-14 ENCOUNTER — Ambulatory Visit: Payer: Self-pay | Admitting: Hematology and Oncology

## 2018-03-18 IMAGING — CT CT ANGIO CHEST
2 of 6 series · 18 of 36 positions shown · IV contrast (isovue)
Comparison: CTA chest dated 01/07/2012.

CLINICAL DATA: Aortic disease, nontraumatic, known or suspected.
History of pneumonia, pulmonary embolism, DVT.

EXAM:
CT ANGIOGRAPHY CHEST WITH CONTRAST
TECHNIQUE: Multidetector CT imaging of the chest was performed using the
standard protocol during bolus administration of intravenous
contrast. Multiplanar CT image reconstructions and MIPs were
obtained to evaluate the vascular anatomy.
CONTRAST:  100 cc Isovue 370

[Series 8: pe thins · axial · 0.84mm/px · z∈[+1215,+1455]mm · 17 of 270 slices shown]
[im 15/270  lung]
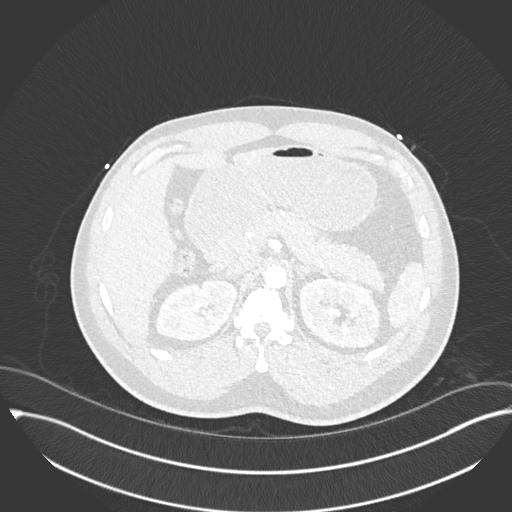
[im 30/270  mediastinal]
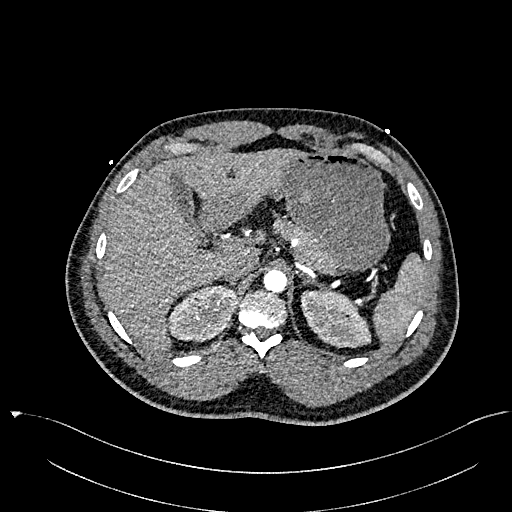
[im 45/270  lung]
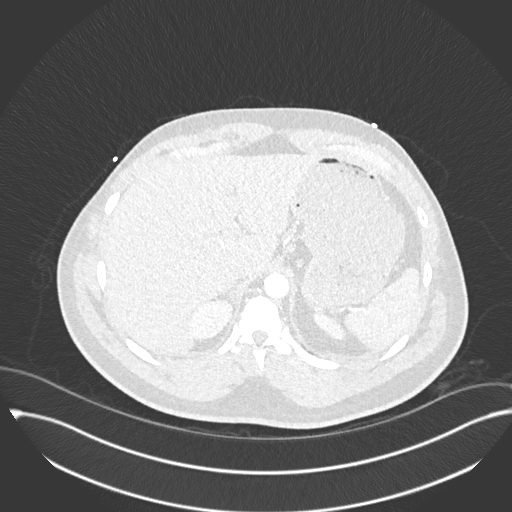
[im 60/270  mediastinal]
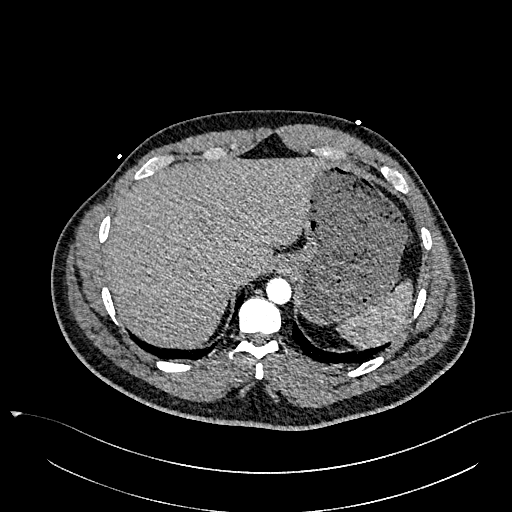
[im 75/270  lung]
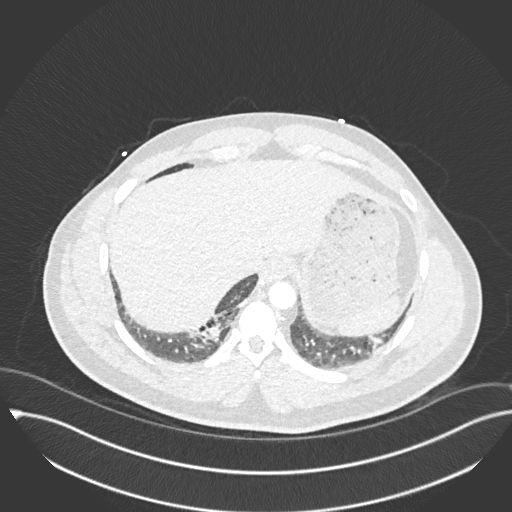
[im 90/270  mediastinal]
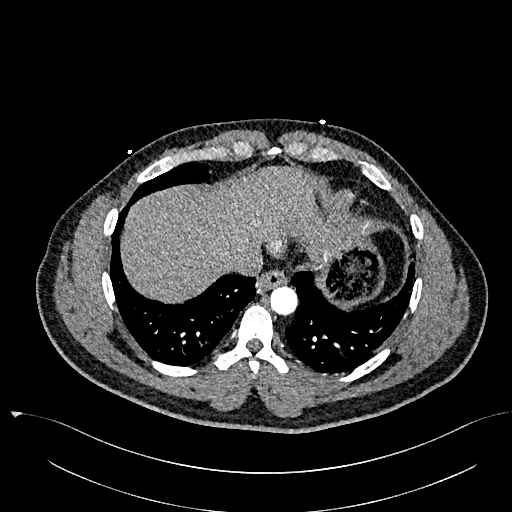
[im 105/270  lung]
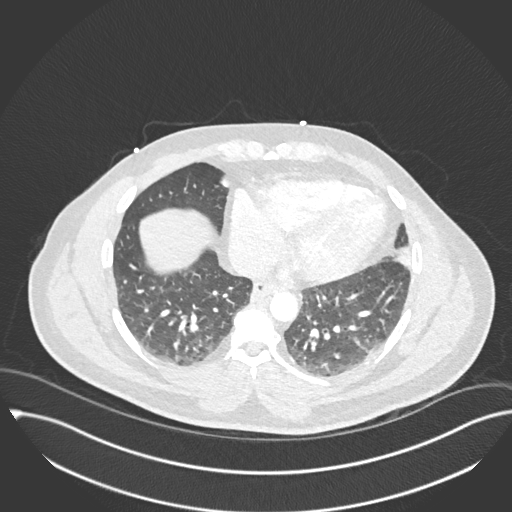
[im 120/270  mediastinal]
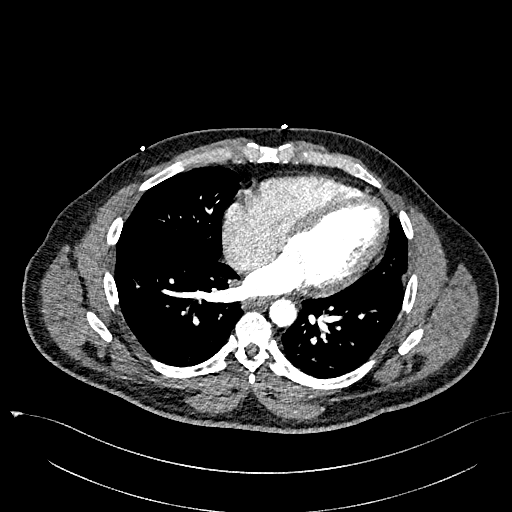
[im 135/270  lung]
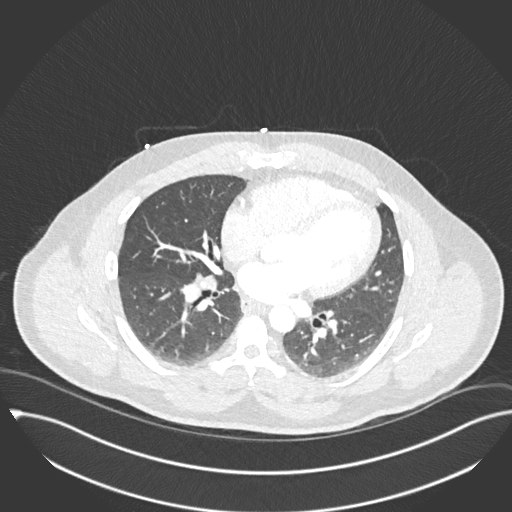
[im 150/270  mediastinal]
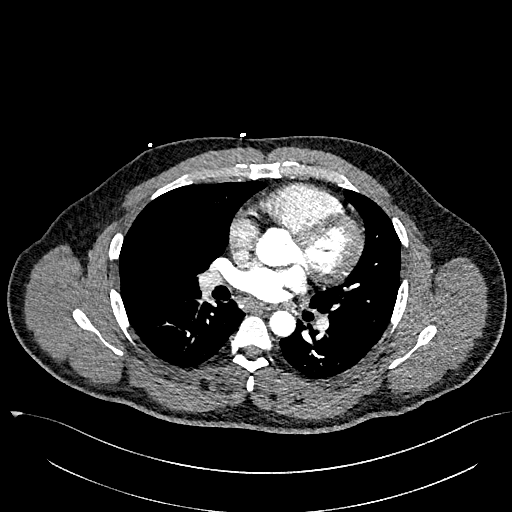
[im 165/270  lung]
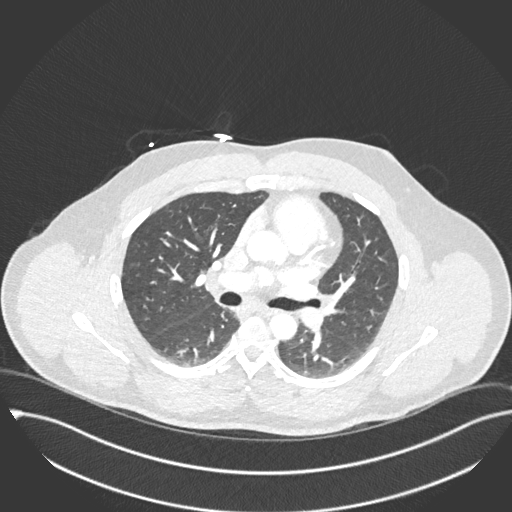
[im 180/270  mediastinal]
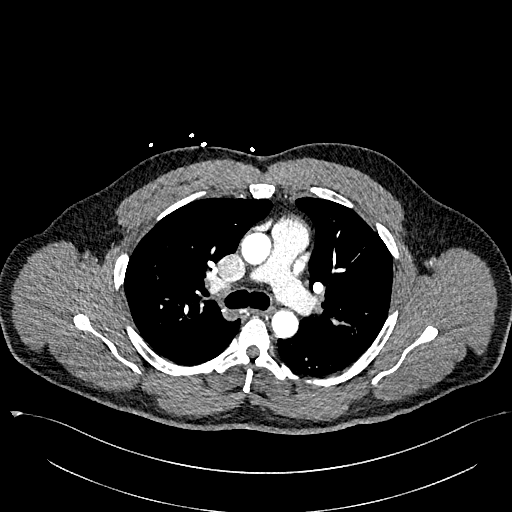
[im 195/270  lung]
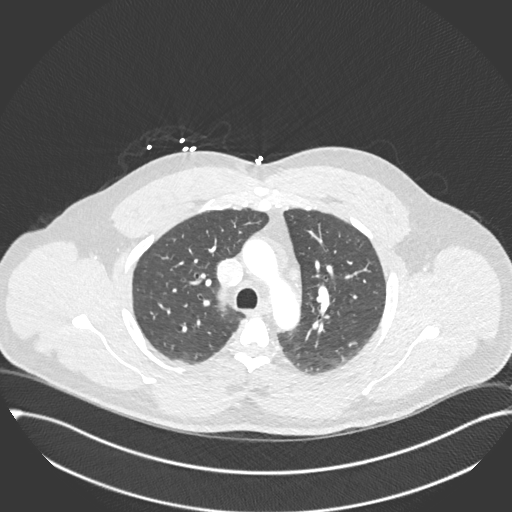
[im 210/270  mediastinal]
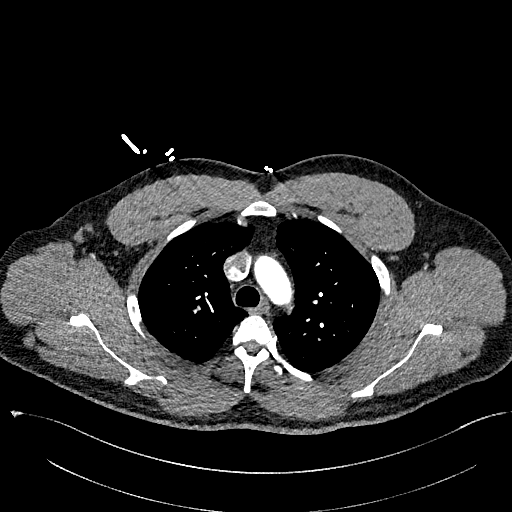
[im 225/270  lung]
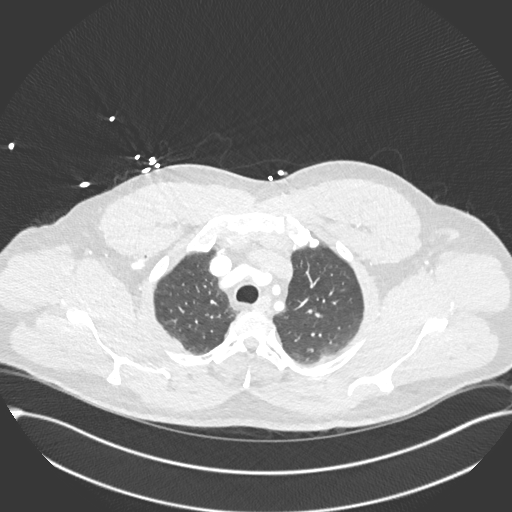
[im 240/270  mediastinal]
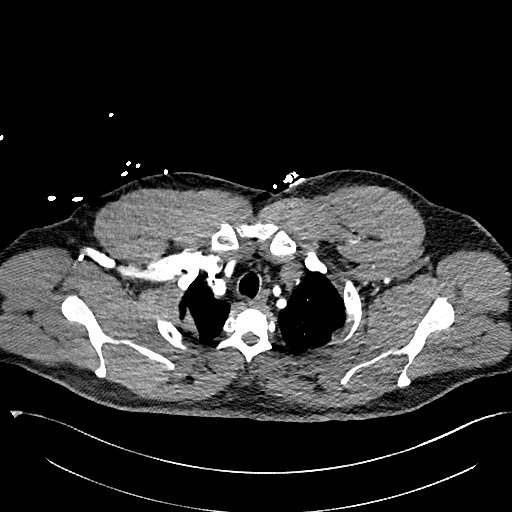
[im 255/270  lung]
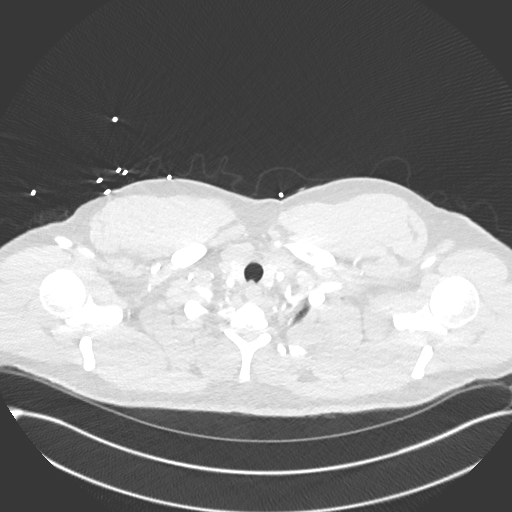

[Series 9: pe 2mm cor · coronal · 0.53mm/px · 1 of 151 slices shown]
[im 76/151  mediastinal]
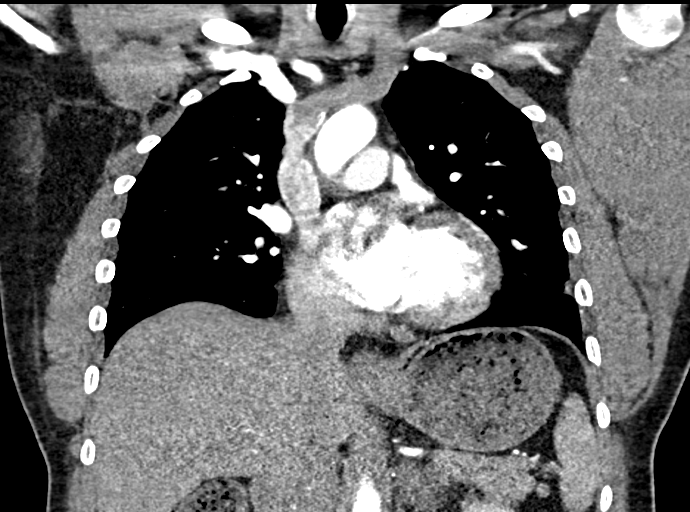

[18 of 36 positions shown; findings below may reference images not displayed]

FINDINGS: Cardiovascular: There is no pulmonary embolism identified within the
main, lobar or segmental pulmonary arteries bilaterally.

Heart size is normal. No pericardial effusion. No aortic aneurysm.
Insufficient intra-aortic contrast to evaluate for dissection.

Mediastinum/Nodes: No mass or enlarged lymph nodes within the
mediastinum or perihilar regions. Esophagus appears normal. Trachea
appears normal.

Lungs/Pleura: Parabronchial thickening within the central perihilar
regions, right slightly greater than left. More peripheral
bronchioles are unremarkable. Stable focal scarring/fibrosis within
the right lower lobe. Lungs otherwise clear. No pleural effusion or
pneumothorax.

Upper Abdomen: No acute abnormality.

Musculoskeletal: No chest wall abnormality. No acute or significant
osseous findings.

Review of the MIP images confirms the above findings.
IMPRESSION: 1. No acute findings. No pulmonary embolism. No pneumonia or
pulmonary edema.
2. No aortic aneurysm. Insufficient intra-aortic contrast to
evaluate for dissection.
3. Bronchitic changes, acute versus chronic, favor acute.

## 2022-11-22 ENCOUNTER — Other Ambulatory Visit: Payer: Self-pay

## 2022-11-22 ENCOUNTER — Ambulatory Visit (HOSPITAL_COMMUNITY)
Admission: EM | Disposition: A | Discharge status: Home / Self Care | Payer: Self-pay | Source: Home / Self Care | Attending: Emergency Medicine

## 2022-11-22 ENCOUNTER — Observation Stay (HOSPITAL_COMMUNITY)
Admission: EM | Admit: 2022-11-22 | Discharge: 2022-11-23 | Disposition: A | Discharge status: Home / Self Care | Payer: 59 | Attending: Cardiovascular Disease | Admitting: Cardiovascular Disease

## 2022-11-22 ENCOUNTER — Encounter (HOSPITAL_COMMUNITY): Payer: Self-pay

## 2022-11-22 ENCOUNTER — Observation Stay (HOSPITAL_COMMUNITY): Payer: 59

## 2022-11-22 ENCOUNTER — Emergency Department (HOSPITAL_COMMUNITY): Payer: 59

## 2022-11-22 DIAGNOSIS — F17210 Nicotine dependence, cigarettes, uncomplicated: Secondary | ICD-10-CM | POA: Diagnosis not present

## 2022-11-22 DIAGNOSIS — R079 Chest pain, unspecified: Principal | ICD-10-CM | POA: Insufficient documentation

## 2022-11-22 DIAGNOSIS — I214 Non-ST elevation (NSTEMI) myocardial infarction: Secondary | ICD-10-CM | POA: Diagnosis not present

## 2022-11-22 DIAGNOSIS — Z86718 Personal history of other venous thrombosis and embolism: Secondary | ICD-10-CM | POA: Insufficient documentation

## 2022-11-22 DIAGNOSIS — Z7982 Long term (current) use of aspirin: Secondary | ICD-10-CM | POA: Insufficient documentation

## 2022-11-22 DIAGNOSIS — I213 ST elevation (STEMI) myocardial infarction of unspecified site: Principal | ICD-10-CM

## 2022-11-22 DIAGNOSIS — Z86711 Personal history of pulmonary embolism: Secondary | ICD-10-CM | POA: Diagnosis not present

## 2022-11-22 DIAGNOSIS — Z7901 Long term (current) use of anticoagulants: Secondary | ICD-10-CM | POA: Insufficient documentation

## 2022-11-22 HISTORY — PX: LEFT HEART CATH AND CORONARY ANGIOGRAPHY: CATH118249

## 2022-11-22 LAB — PROTIME-INR
INR: 1 (ref 0.8–1.2)
Prothrombin Time: 13.5 seconds (ref 11.4–15.2)

## 2022-11-22 LAB — CBC
HCT: 41.6 % (ref 39.0–52.0)
Hemoglobin: 14 g/dL (ref 13.0–17.0)
MCH: 29.5 pg (ref 26.0–34.0)
MCHC: 33.7 g/dL (ref 30.0–36.0)
MCV: 87.8 fL (ref 80.0–100.0)
Platelets: 246 10*3/uL (ref 150–400)
RBC: 4.74 MIL/uL (ref 4.22–5.81)
RDW: 14.1 % (ref 11.5–15.5)
WBC: 9.3 10*3/uL (ref 4.0–10.5)
nRBC: 0 % (ref 0.0–0.2)

## 2022-11-22 LAB — HEMOGLOBIN A1C
Hgb A1c MFr Bld: 6.4 % — ABNORMAL HIGH (ref 4.8–5.6)
Mean Plasma Glucose: 136.98 mg/dL

## 2022-11-22 LAB — BASIC METABOLIC PANEL
Anion gap: 14 (ref 5–15)
BUN: 17 mg/dL (ref 6–20)
CO2: 21 mmol/L — ABNORMAL LOW (ref 22–32)
Calcium: 9.3 mg/dL (ref 8.9–10.3)
Chloride: 99 mmol/L (ref 98–111)
Creatinine, Ser: 1.03 mg/dL (ref 0.61–1.24)
GFR, Estimated: >60 mL/min (ref >60)
Glucose, Bld: 138 mg/dL — ABNORMAL HIGH (ref 70–99)
Potassium: 3.8 mmol/L (ref 3.5–5.1)
Sodium: 134 mmol/L — ABNORMAL LOW (ref 135–145)

## 2022-11-22 LAB — I-STAT CG4 LACTIC ACID, ED: Lactic Acid, Venous: 1.5 mmol/L (ref 0.5–1.9)

## 2022-11-22 LAB — LIPID PANEL
Cholesterol: 273 mg/dL — ABNORMAL HIGH (ref 0–200)
HDL: 40 mg/dL — ABNORMAL LOW (ref >40)
LDL Cholesterol: 214 mg/dL — ABNORMAL HIGH (ref 0–99)
Total CHOL/HDL Ratio: 6.8 RATIO
Triglycerides: 94 mg/dL (ref <150)
VLDL: 19 mg/dL (ref 0–40)

## 2022-11-22 LAB — TROPONIN I (HIGH SENSITIVITY)
Troponin I (High Sensitivity): 280 ng/L (ref <18)
Troponin I (High Sensitivity): 10514 ng/L (ref <18)

## 2022-11-22 LAB — APTT: aPTT: 27 seconds (ref 24–36)

## 2022-11-22 SURGERY — LEFT HEART CATH AND CORONARY ANGIOGRAPHY
Anesthesia: LOCAL

## 2022-11-22 MED ORDER — ACETAMINOPHEN 325 MG PO TABS: 650.0000 mg | ORAL_TABLET | ORAL | Status: DC | PRN

## 2022-11-22 MED ORDER — MIDAZOLAM HCL 2 MG/2ML IJ SOLN
INTRAMUSCULAR | Status: DC | PRN
  Administered 2022-11-22: 1 mg via INTRAVENOUS

## 2022-11-22 MED ORDER — IOHEXOL 350 MG/ML SOLN
INTRAVENOUS | Status: DC | PRN
  Administered 2022-11-22: 75 mL via INTRA_ARTERIAL

## 2022-11-22 MED ORDER — VERAPAMIL HCL 2.5 MG/ML IV SOLN
INTRAVENOUS | Status: DC | PRN
  Administered 2022-11-22: 10 mL via INTRA_ARTERIAL

## 2022-11-22 MED ORDER — SODIUM CHLORIDE 0.9 % IV SOLN: INTRAVENOUS | Status: AC

## 2022-11-22 MED ORDER — ASPIRIN 81 MG PO CHEW
324.0000 mg | CHEWABLE_TABLET | Freq: Once | ORAL | Status: AC
  Administered 2022-11-22: 324 mg via ORAL
  Filled 2022-11-22: qty 4

## 2022-11-22 MED ORDER — DIAZEPAM 5 MG PO TABS: 5.0000 mg | ORAL_TABLET | ORAL | Status: DC | PRN

## 2022-11-22 MED ORDER — SODIUM CHLORIDE 0.9% FLUSH: 3.0000 mL | INTRAVENOUS | Status: DC | PRN

## 2022-11-22 MED ORDER — FENTANYL CITRATE (PF) 100 MCG/2ML IJ SOLN
INTRAMUSCULAR | Status: AC
  Filled 2022-11-22: qty 2

## 2022-11-22 MED ORDER — MIDAZOLAM HCL 2 MG/2ML IJ SOLN
INTRAMUSCULAR | Status: AC
  Filled 2022-11-22: qty 2

## 2022-11-22 MED ORDER — VERAPAMIL HCL 2.5 MG/ML IV SOLN
INTRAVENOUS | Status: AC
  Filled 2022-11-22: qty 2

## 2022-11-22 MED ORDER — HYDRALAZINE HCL 20 MG/ML IJ SOLN: 10.0000 mg | INTRAMUSCULAR | Status: AC | PRN

## 2022-11-22 MED ORDER — HEPARIN SODIUM (PORCINE) 1000 UNIT/ML IJ SOLN
INTRAMUSCULAR | Status: AC
  Filled 2022-11-22: qty 10

## 2022-11-22 MED ORDER — SODIUM CHLORIDE 0.9% FLUSH
3.0000 mL | Freq: Two times a day (BID) | INTRAVENOUS | Status: DC
  Administered 2022-11-22 – 2022-11-23 (×3): 3 mL via INTRAVENOUS

## 2022-11-22 MED ORDER — NITROGLYCERIN 0.4 MG SL SUBL
0.4000 mg | SUBLINGUAL_TABLET | Freq: Once | SUBLINGUAL | Status: AC
  Administered 2022-11-22: 0.4 mg via SUBLINGUAL
  Filled 2022-11-22: qty 1

## 2022-11-22 MED ORDER — IOHEXOL 350 MG/ML SOLN
75.0000 mL | Freq: Once | INTRAVENOUS | Status: AC | PRN
  Administered 2022-11-22: 75 mL via INTRAVENOUS

## 2022-11-22 MED ORDER — OXYCODONE HCL 5 MG PO TABS
5.0000 mg | ORAL_TABLET | ORAL | Status: DC | PRN
  Administered 2022-11-22: 5 mg via ORAL
  Filled 2022-11-22: qty 1

## 2022-11-22 MED ORDER — LIDOCAINE HCL (PF) 1 % IJ SOLN
INTRAMUSCULAR | Status: DC | PRN
  Administered 2022-11-22: 2 mL

## 2022-11-22 MED ORDER — LIDOCAINE HCL (PF) 1 % IJ SOLN
INTRAMUSCULAR | Status: AC
  Filled 2022-11-22: qty 30

## 2022-11-22 MED ORDER — HEPARIN (PORCINE) IN NACL 1000-0.9 UT/500ML-% IV SOLN
INTRAVENOUS | Status: DC | PRN
  Administered 2022-11-22 (×2): 500 mL

## 2022-11-22 MED ORDER — LABETALOL HCL 5 MG/ML IV SOLN: 10.0000 mg | INTRAVENOUS | Status: AC | PRN

## 2022-11-22 MED ORDER — SODIUM CHLORIDE 0.9 % IV SOLN: 250.0000 mL | INTRAVENOUS | Status: DC | PRN

## 2022-11-22 MED ORDER — HEPARIN SODIUM (PORCINE) 5000 UNIT/ML IJ SOLN
4000.0000 [IU] | Freq: Once | INTRAMUSCULAR | Status: AC
  Administered 2022-11-22: 4000 [IU] via INTRAVENOUS
  Filled 2022-11-22: qty 1

## 2022-11-22 MED ORDER — SODIUM CHLORIDE 0.9 % IV SOLN: INTRAVENOUS | Status: DC

## 2022-11-22 MED ORDER — ONDANSETRON HCL 4 MG/2ML IJ SOLN: 4.0000 mg | Freq: Four times a day (QID) | INTRAMUSCULAR | Status: DC | PRN

## 2022-11-22 MED ORDER — RIVAROXABAN 20 MG PO TABS
20.0000 mg | ORAL_TABLET | Freq: Every day | ORAL | Status: DC
  Administered 2022-11-22 – 2022-11-23 (×2): 20 mg via ORAL
  Filled 2022-11-22 (×2): qty 1

## 2022-11-22 MED ORDER — FENTANYL CITRATE (PF) 100 MCG/2ML IJ SOLN
INTRAMUSCULAR | Status: DC | PRN
  Administered 2022-11-22: 25 ug via INTRAVENOUS

## 2022-11-22 SURGICAL SUPPLY — 13 items
CATH 5FR JL3.5 JR4 ANG PIG MP (CATHETERS) IMPLANT
CATH LAUNCHER 6FR EBU3.5 (CATHETERS) IMPLANT
DEVICE RAD COMP TR BAND LRG (VASCULAR PRODUCTS) IMPLANT
ELECT DEFIB PAD ADLT CADENCE (PAD) IMPLANT
GLIDESHEATH SLEND SS 6F .021 (SHEATH) IMPLANT
GUIDEWIRE INQWIRE 1.5J.035X260 (WIRE) IMPLANT
INQWIRE 1.5J .035X260CM (WIRE) ×1
KIT ENCORE 26 ADVANTAGE (KITS) IMPLANT
PACK CARDIAC CATHETERIZATION (CUSTOM PROCEDURE TRAY) ×1 IMPLANT
PROTECTION STATION PRESSURIZED (MISCELLANEOUS) ×1
SET ATX-X65L (MISCELLANEOUS) IMPLANT
STATION PROTECTION PRESSURIZED (MISCELLANEOUS) IMPLANT
TUBING CIL FLEX 10 FLL-RA (TUBING) IMPLANT

## 2022-11-22 NOTE — ED Notes (Signed)
[facility] called to activate STEMI

## 2022-11-22 NOTE — ED Triage Notes (Signed)
Pt complaining of chest pain that started in the center of chest and is going down the left arm. He was getting up for work today when it started. Denies any pain like this before.

## 2022-11-22 NOTE — Progress Notes (Addendum)
Continues to have chest discomfort, although it has "moved" towards the left chest. No dyspnea.  Normal coronary angiography. Knowing that, it is most likely that his ECG changes and the troponin release could represent RV strain due to acute pulmonary embolism. Review of his old records shows a recommendation for lifelong anticoagulation for a hypercoagulable state of undefined etiology (Anticardiolipin AB and other tests were negative in 2018). He took warfarin and then rivaroxaban for a while, then stopped due to lack of insurance, when the samples were unavailable. Will check CT angiogram for PE. Another possibility is spontaneous arterial thrombosis that re-perfused. Either way, anticoagulation is probably best step. Discussed the indication for lifelong anticoagulation. He now has Medicaid.  Will start anticoagulation after the post arteriotomy period.

## 2022-11-22 NOTE — ED Provider Notes (Signed)
[place] EMERGENCY DEPARTMENT AT [facility] Provider Note   CSN: [id number] Arrival date & time: 11/22/22  0518     History  Chief Complaint  Patient presents with   Chest Pain    Daniel Glover is a 62 y.o. male.  Patient presents with central and left-sided chest pain that onset about 1 hour ago while he was getting ready for work.  It started after he woke up.  Pain is in the center of his chest and radiates to his left arm and feels like a "crushing".  Associate with some shortness of breath.  Denies nausea, vomiting, diaphoresis, cough or fever.  No abdominal pain.  No leg pain or leg swelling.  Denies cardiac history.  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 States no history of MI or ACS.  Denies fever, leg pain, leg swelling.  Pain is getting worse since the first started.  The history is provided by the patient. The history is limited by the condition of the patient.  Chest Pain Associated symptoms: nausea and shortness of breath   Associated symptoms: no abdominal pain, no cough, no dizziness, no fever, no headache, no vomiting and no weakness        Home Medications Prior to Admission medications   Medication Sig Start Date End Date Taking? Authorizing Provider  aspirin EC 81 MG tablet Take 1 tablet (81 mg total) by mouth daily. 11/15/16   Rhyne, Ames Coupe, PA-C  cephALEXin (KEFLEX) 500 MG capsule Take 1 capsule (500 mg total) by mouth 3 (three) times daily. 03/29/17   Early, Kristen Loader, MD  ELIQUIS STARTER PACK (ELIQUIS STARTER PACK) 5 MG TABS Take as directed on package: start with two-5mg  tablets twice daily for 7 days. On day 8, switch to one-5mg  tablet twice daily. 03/27/17   Daisy Blossom, MD      Allergies    Patient has no known allergies.    Review of Systems   Review of Systems  Constitutional:  Negative for activity change, appetite change and fever.  HENT:  Negative for  congestion and rhinorrhea.   Respiratory:  Positive for chest tightness and shortness of breath. Negative for cough.   Cardiovascular:  Positive for chest pain.  Gastrointestinal:  Positive for nausea. Negative for abdominal pain and vomiting.  Genitourinary:  Negative for dysuria and hematuria.  Musculoskeletal:  Negative for arthralgias and myalgias.  Skin:  Negative for rash.  Neurological:  Negative for dizziness, weakness and headaches.   all other systems are negative except as noted in the HPI and PMH.    Physical Exam Updated Vital Signs BP (!) 158/93 (BP Location: Right Arm)   Pulse (!) 106   Temp 98.3 F (36.8 C) (Oral)   Resp 20   Ht 5\' 6"  (1.676 m)   Wt 84.8 kg   SpO2 98%   BMI 30.18 kg/m  Physical Exam Vitals and nursing note reviewed.  Constitutional:      General: He is not in acute distress.    Appearance: He is well-developed.     Comments: Uncomfortable, gripping left chest  HENT:     Head: Normocephalic and atraumatic.     Mouth/Throat:     Pharynx: No oropharyngeal exudate.  Eyes:     Conjunctiva/sclera: Conjunctivae normal.     Pupils: Pupils are equal, round, and reactive to light.  Neck:  Comments: No meningismus. Cardiovascular:     Rate and Rhythm: Normal rate and regular rhythm.     Heart sounds: Normal heart sounds. No murmur heard. Pulmonary:     Effort: Pulmonary effort is normal. No respiratory distress.     Breath sounds: Normal breath sounds.  Chest:     Chest wall: No tenderness.  Abdominal:     Palpations: Abdomen is soft.     Tenderness: There is no abdominal tenderness. There is no guarding or rebound.  Musculoskeletal:        General: No tenderness. Normal range of motion.     Cervical back: Normal range of motion and neck supple.     Comments: Equal radial pulses and cardinal hand movement  Skin:    General: Skin is warm.  Neurological:     Mental Status: He is alert and oriented to person, place, and time.     Cranial  Nerves: No cranial nerve deficit.     Motor: No abnormal muscle tone.     Coordination: Coordination normal.     Comments:  5/5 strength throughout. CN 2-12 intact.Equal grip strength.   Psychiatric:        Behavior: Behavior normal.     ED Results / Procedures / Treatments   Labs (all labs ordered are listed, but only abnormal results are displayed) Labs Reviewed  BASIC METABOLIC PANEL - Abnormal; Notable for the following components:      Result Value   Sodium 134 (*)    CO2 21 (*)    Glucose, Bld 138 (*)    All other components within normal limits  HEMOGLOBIN A1C - Abnormal; Notable for the following components:   Hgb A1c MFr Bld 6.4 (*)    All other components within normal limits  LIPID PANEL - Abnormal; Notable for the following components:   Cholesterol 273 (*)    HDL 40 (*)    LDL Cholesterol 214 (*)    All other components within normal limits  TROPONIN I (HIGH SENSITIVITY) - Abnormal; Notable for the following components:   Troponin I (High Sensitivity) 280 (*)    All other components within normal limits  CBC  PROTIME-INR  APTT  I-STAT CG4 LACTIC ACID, ED    EKG EKG Interpretation Date/Time:  Wednesday November 22 2022 05:43:52 EDT Ventricular Rate:  95 PR Interval:  161 QRS Duration:  77 QT Interval:  351 QTC Calculation: 442 R Axis:   76  Text Interpretation: Age not entered, assumed to be  62 years old for purpose of ECG interpretation Sinus rhythm Inferior infarct, acute (LCx) Lateral leads are also involved >>> Acute MI <<<  new ST elevation inferiorly laterally compared to 20 minutes earlier with some  ST depression in aVR and aVL. Confirmed by Glynn Octave [phone]) on 11/22/2022 5:55:29 AM  Radiology DG Chest 2 View  Result Date: 11/22/2022 CLINICAL DATA:  Chest pain EXAM: CHEST - 2 VIEW COMPARISON:  03/21/2013 FINDINGS: Artifact from EKG leads. There is no edema, consolidation, effusion, or pneumothorax. Normal heart size and mediastinal  contours. IMPRESSION: No evidence of active disease. Electronically Signed   By: Tiburcio Pea M.D.   On: 11/22/2022 06:23    Procedures .Critical Care  Performed by: Glynn Octave, MD Authorized by: Glynn Octave, MD   Critical care provider statement:    Critical care time (minutes):  35   Critical care time was exclusive of:  Separately billable procedures and treating other patients   Critical care was  necessary to treat or prevent imminent or life-threatening deterioration of the following conditions: STEMI.   Critical care was time spent personally by me on the following activities:  Development of treatment plan with patient or surrogate, discussions with consultants, evaluation of patient's response to treatment, examination of patient, ordering and review of laboratory studies, ordering and review of radiographic studies, ordering and performing treatments and interventions, pulse oximetry, re-evaluation of patient's condition, review of old charts, blood draw for specimens and obtaining history from patient or surrogate   I assumed direction of critical care for this patient from another provider in my specialty: no     Care discussed with: admitting provider       Medications Ordered in ED Medications  aspirin chewable tablet 324 mg (has no administration in time range)  nitroGLYCERIN (NITROSTAT) SL tablet 0.4 mg (has no administration in time range)  0.9 %  sodium chloride infusion (has no administration in time range)  heparin injection 4,000 Units (has no administration in time range)    ED Course/ Medical Decision Making/ A&P                                 Medical Decision Making Amount and/or Complexity of Data Reviewed Labs: ordered. Decision-making details documented in ED Course. Radiology: ordered and independent interpretation performed. Decision-making details documented in ED Course. ECG/medicine tests: ordered and independent interpretation performed.  Decision-making details documented in ED Course.  Risk OTC drugs. Prescription drug management. Decision regarding hospitalization.   Central left-sided chest pain that onset 1 hour ago associate with shortness of breath.  Initial EKG in triage shows sinus rhythm with nonspecific T wave changes anteriorly.  20 minutes later EKG shows ST elevation inferiorly and laterally with some depression in aVR and aVL.  Given these dynamic changes, code STEMI was activated.  He is loaded with aspirin given 4000 units of heparin.  Also consider recurrent pulmonary embolism  Dynamic EKG changes progressive inferior ST elevation and depression in aVR and aVL.  Code STEMI activated.  Discussed with cardiology Dr. Elayne Guerin. Patient taken emergently to the Cath Lab.  Blood pressure and mental status and airway stable throughout ED course        Final Clinical Impression(s) / ED Diagnoses Final diagnoses:  ST elevation myocardial infarction (STEMI), unspecified artery [facility])    Rx / DC Orders ED Discharge Orders     None         Curstin Schmale, Jeannett Senior, MD 11/22/22 [phone]

## 2022-11-22 NOTE — H&P (Addendum)
Cardiology Admission History and Physical   Patient ID: Daniel Glover MRN: [id number]; DOB: 07/19/1960   Admission date: 11/22/2022  PCP:  Patient, No Pcp Per   [facility] Providers Cardiologist:  None        Chief Complaint:  chest pain  Patient Profile:   Daniel Glover is a 62 y.o. male with history of PE and DVT in 2019 (no longer on [facility]) who is being seen 11/22/2022 for the evaluation of chest pain.  History of Present Illness:   Daniel Glover has limited medical history aside from prior PE and DVT in 2019 (no longer on [facility]). He denies cardiac history and is not on any cardiac medications. Patient presented to the ED after waking up this morning and developing squeezing chest pain while getting ready for work. He describes central/substernal pain that radiates into his arm. Also reports some vague sensation of dizziness. Denies shortness of breath or GI upset. Initial ECG in the ED with sinus rhythm and nonspecific T wave changes in anterior leads. Repeat tracing with ongoing chest pain concerning for inferior lead ST elevation. Patient denies any recent chest pain, shortness of breath, or exertional tolerance.   Past Medical History:  Diagnosis Date   DVT (deep venous thrombosis) (HCC)    and RA on systemic anticoagulation   PE (pulmonary embolism)    Pneumonia     Past Surgical History:  Procedure Laterality Date   APPENDECTOMY     arm surgery     THROMBECTOMY FEMORAL ARTERY Left 11/12/2016   Procedure: THROMBECTOMY LEFT SUPERFICIAL FEMORAL ARTERY, LEFT POPLITEAL ARTERY AND LEFT TIBIAL ARTERY ;  Surgeon: [name], MD;  Location: [facility] OR;  Service: Vascular;  Laterality: Left;   TONSILLECTOMY       Medications Prior to Admission: Prior to Admission medications   Medication Sig Start Date End Date Taking? Authorizing Provider  aspirin EC 81 MG tablet Take 1 tablet (81 mg total) by mouth daily. 11/15/16   Rhyne, Ames Coupe, PA-C  cephALEXin (KEFLEX) 500 MG capsule  Take 1 capsule (500 mg total) by mouth 3 (three) times daily. 03/29/17   Early, Kristen Loader, MD  ELIQUIS STARTER PACK (ELIQUIS STARTER PACK) 5 MG TABS Take as directed on package: start with two-5mg  tablets twice daily for 7 days. On day 8, switch to one-5mg  tablet twice daily. 03/27/17   Daisy Blossom, MD     Allergies:   No Known Allergies  Social History:   Social History   Socioeconomic History   Marital status: Single    Spouse name: Not on file   Number of children: Not on file   Years of education: Not on file   Highest education level: Not on file  Occupational History   Not on file  Tobacco Use   Smoking status: Every Day    Current packs/day: 0.25    Average packs/day: 0.3 packs/day for 15.0 years (3.8 ttl pk-yrs)    Types: Cigarettes   Smokeless tobacco: Never  Vaping Use   Vaping status: Never Used  Substance and Sexual Activity   Alcohol use: Yes    Comment: occassionally   Drug use: No   Sexual activity: Not on file  Other Topics Concern   Not on file  Social History Narrative   Not on file   Social Determinants of Health   Financial Resource Strain: Not on file  Food Insecurity: Not on file  Transportation Needs: Not on file  Physical Activity: Not  on file  Stress: Not on file  Social Connections: Not on file  Intimate Partner Violence: Not on file    Family History:   The patient's family history includes Bleeding Disorder in his father; Hypertension in his brother; Lupus in his mother.    ROS:  Please see the history of present illness.  All other ROS reviewed and negative.     Physical Exam/Data:   Vitals:   11/22/22 0525 11/22/22 0535 11/22/22 0545 11/22/22 0637  BP:  (!) 158/92 (!) 160/117   Pulse:  94 94   Resp:  19 18   Temp:      TempSrc:      SpO2:  100% 100% 99%  Weight: 84.8 kg     Height: 5\' 6"  (1.676 m)      No intake or output data in the 24 hours ending 11/22/22 0647    11/22/2022    5:25 AM 04/10/2017    1:57 PM 03/29/2017     9:32 AM  Last 3 Weights  Weight (lbs) 187 lb 186 lb 189 lb  Weight (kg) 84.823 kg 84.369 kg 85.73 kg     Body mass index is 30.18 kg/m.  General:  Well nourished, well developed, ill appearing HEENT: normal Neck: no JVD Vascular: No carotid bruits; Distal pulses 2+ bilaterally   Cardiac:  normal S1, S2; RRR; no murmur  Lungs:  clear to auscultation bilaterally, no wheezing, rhonchi or rales  Abd: soft, nontender, no hepatomegaly  Ext: no edema Musculoskeletal:  No deformities, BUE and BLE strength normal and equal Skin: warm and dry  Neuro:  CNs 2-12 intact, no focal abnormalities noted Psych:  Normal affect    EKG:  The ECG that was done 9/25 @ 6:26am was personally reviewed and demonstrates inferior lead ST segment elevation in II, III, AVF. Non-specific T wave changes ongoing in anterior leads.  Relevant CV Studies: N/a  Laboratory Data:  High Sensitivity Troponin:   Recent Labs  Lab 11/22/22 0530  TROPONINIHS 280*      Chemistry Recent Labs  Lab 11/22/22 0530  NA 134*  K 3.8  CL 99  CO2 21*  GLUCOSE 138*  BUN 17  CREATININE 1.03  CALCIUM 9.3  GFRNONAA >60  ANIONGAP 14    No results for input(s): "PROT", "ALBUMIN", "AST", "ALT", "ALKPHOS", "BILITOT" in the last 168 hours. Lipids  Recent Labs  Lab 11/22/22 0530  CHOL 273*  TRIG 94  HDL 40*  LDLCALC 214*  CHOLHDL 6.8   Hematology Recent Labs  Lab 11/22/22 0530  WBC 9.3  RBC 4.74  HGB 14.0  HCT 41.6  MCV 87.8  MCH 29.5  MCHC 33.7  RDW 14.1  PLT 246   Thyroid No results for input(s): "TSH", "FREET4" in the last 168 hours. BNPNo results for input(s): "BNP", "PROBNP" in the last 168 hours.  DDimer No results for input(s): "DDIMER" in the last 168 hours.   Radiology/Studies:  DG Chest 2 View  Result Date: 11/22/2022 CLINICAL DATA:  Chest pain EXAM: CHEST - 2 VIEW COMPARISON:  03/21/2013 FINDINGS: Artifact from EKG leads. There is no edema, consolidation, effusion, or pneumothorax.  Normal heart size and mediastinal contours. IMPRESSION: No evidence of active disease. Electronically Signed   By: Tiburcio Pea M.D.   On: 11/22/2022 06:23     Assessment and Plan:   Chest pain Inferior STEMI  Patient presented with acute onset of central chest pain, radiating to arm. Initial ECG with non-specific T wave changes.  Repeat ECG concerning for inferior STEMI with ST elevation in II, III, AVF. Initial troponin 280. STEMI activation.  Urgent LHC with Dr. Excell Seltzer Patient loaded on ASA prior to cath Will need echocardiogram today DAPT per LHC findings Plan initiation of high intensity statin Check lipid panel and LPA.   Risk Assessment/Risk Scores:    TIMI Risk Score for ST  Elevation MI:   The patient's TIMI risk score is  , which indicates a  % risk of all cause mortality at 30 days.       Code Status: Full Code  Severity of Illness: The appropriate patient status for this patient is INPATIENT. Inpatient status is judged to be reasonable and necessary in order to provide the required intensity of service to ensure the patient's safety. The patient's presenting symptoms, physical exam findings, and initial radiographic and laboratory data in the context of their chronic comorbidities is felt to place them at high risk for further clinical deterioration. Furthermore, it is not anticipated that the patient will be medically stable for discharge from the hospital within 2 midnights of admission.   * I certify that at the point of admission it is my clinical judgment that the patient will require inpatient hospital care spanning beyond 2 midnights from the point of admission due to high intensity of service, high risk for further deterioration and high frequency of surveillance required.*   For questions or updates, please contact [facility] Please consult [email or web address] for contact info under     Signed, Perlie Gold, PA-C  11/22/2022 6:47 AM   Patient seen,  examined. Available data reviewed. Agree with findings, assessment, and plan as outlined by Perlie Gold, PA-C.  The patient presents with chest pain.  This is acute onset early this morning.  On exam, he is alert oriented in no distress.  HEENT is normal, JVP is normal, carotid upstrokes normal with no bruits, lungs are clear bilaterally, heart is regular rate and rhythm no murmur gallop, abdomen soft nontender, extremities have no edema, skin is warm and dry with no rash.  Initial EKG is nondiagnostic, repeat EKG shows mild inferolateral ST elevation and subtle reciprocal changes consistent with inferolateral STEMI.  Code STEMI is called and the patient is brought emergently to the cardiac catheterization lab.  Further plans/disposition pending his cardiac catheterization results.  The patient does have a remote history of DVT, PE, and lower extremity arterial thrombosis.  I reviewed notes of oncology from 2018 and it was recommended that he take oral anticoagulation and antiplatelet therapy indefinitely.  It appears his serologic testing was negative and he has subsequently been lost to follow-up and has not continued anticoagulation to date.  Tonny Bollman, M.D. 11/22/2022 7:17 AM

## 2022-11-22 NOTE — ED Notes (Signed)
Lactic 1.48

## 2022-11-23 ENCOUNTER — Other Ambulatory Visit (HOSPITAL_COMMUNITY): Payer: Self-pay

## 2022-11-23 ENCOUNTER — Observation Stay (HOSPITAL_BASED_OUTPATIENT_CLINIC_OR_DEPARTMENT_OTHER): Payer: 59

## 2022-11-23 ENCOUNTER — Encounter (HOSPITAL_COMMUNITY): Payer: Self-pay | Admitting: Cardiovascular Disease

## 2022-11-23 ENCOUNTER — Telehealth: Payer: Self-pay | Admitting: Cardiology

## 2022-11-23 DIAGNOSIS — Z86711 Personal history of pulmonary embolism: Secondary | ICD-10-CM | POA: Diagnosis not present

## 2022-11-23 DIAGNOSIS — Z86718 Personal history of other venous thrombosis and embolism: Secondary | ICD-10-CM | POA: Diagnosis not present

## 2022-11-23 DIAGNOSIS — R079 Chest pain, unspecified: Secondary | ICD-10-CM

## 2022-11-23 DIAGNOSIS — I213 ST elevation (STEMI) myocardial infarction of unspecified site: Secondary | ICD-10-CM | POA: Diagnosis not present

## 2022-11-23 LAB — CBC
HCT: 42.9 % (ref 39.0–52.0)
Hemoglobin: 14.4 g/dL (ref 13.0–17.0)
MCH: 29.3 pg (ref 26.0–34.0)
MCHC: 33.6 g/dL (ref 30.0–36.0)
MCV: 87.2 fL (ref 80.0–100.0)
Platelets: 260 10*3/uL (ref 150–400)
RBC: 4.92 MIL/uL (ref 4.22–5.81)
RDW: 14.3 % (ref 11.5–15.5)
WBC: 8.3 10*3/uL (ref 4.0–10.5)
nRBC: 0 % (ref 0.0–0.2)

## 2022-11-23 LAB — C-REACTIVE PROTEIN: CRP: 2.1 mg/dL — ABNORMAL HIGH (ref <1.0)

## 2022-11-23 LAB — ECHOCARDIOGRAM COMPLETE
Area-P 1/2: 2.99 cm2
Height: 66 in
S' Lateral: 2.9 cm
Weight: 2992 oz

## 2022-11-23 LAB — HEPATIC FUNCTION PANEL
ALT: 28 U/L (ref 0–44)
AST: 55 U/L — ABNORMAL HIGH (ref 15–41)
Albumin: 3.5 g/dL (ref 3.5–5.0)
Alkaline Phosphatase: 63 U/L (ref 38–126)
Bilirubin, Direct: 0.1 mg/dL (ref 0.0–0.2)
Indirect Bilirubin: 1.2 mg/dL — ABNORMAL HIGH (ref 0.3–0.9)
Total Bilirubin: 1.3 mg/dL — ABNORMAL HIGH (ref 0.3–1.2)
Total Protein: 6.9 g/dL (ref 6.5–8.1)

## 2022-11-23 LAB — BASIC METABOLIC PANEL
Anion gap: 12 (ref 5–15)
BUN: 17 mg/dL (ref 6–20)
CO2: 23 mmol/L (ref 22–32)
Calcium: 9.3 mg/dL (ref 8.9–10.3)
Chloride: 100 mmol/L (ref 98–111)
Creatinine, Ser: 1.04 mg/dL (ref 0.61–1.24)
GFR, Estimated: >60 mL/min (ref >60)
Glucose, Bld: 114 mg/dL — ABNORMAL HIGH (ref 70–99)
Potassium: 3.9 mmol/L (ref 3.5–5.1)
Sodium: 135 mmol/L (ref 135–145)

## 2022-11-23 LAB — TROPONIN I (HIGH SENSITIVITY): Troponin I (High Sensitivity): 8692 ng/L (ref <18)

## 2022-11-23 LAB — SEDIMENTATION RATE: Sed Rate: 28 mm/hr — ABNORMAL HIGH (ref 0–16)

## 2022-11-23 MED ORDER — AMLODIPINE BESYLATE 5 MG PO TABS
5.0000 mg | ORAL_TABLET | Freq: Every day | ORAL | 3 refills | Status: AC
  Filled 2022-11-23: qty 30, 30d supply, fill #0

## 2022-11-23 MED ORDER — ROSUVASTATIN CALCIUM 20 MG PO TABS
40.0000 mg | ORAL_TABLET | Freq: Every day | ORAL | Status: DC
  Administered 2022-11-23: 40 mg via ORAL
  Filled 2022-11-23: qty 2

## 2022-11-23 MED ORDER — ROSUVASTATIN CALCIUM 40 MG PO TABS
40.0000 mg | ORAL_TABLET | Freq: Every day | ORAL | 2 refills | Status: AC
  Filled 2022-11-23: qty 30, 30d supply, fill #0

## 2022-11-23 MED ORDER — RIVAROXABAN 20 MG PO TABS
20.0000 mg | ORAL_TABLET | Freq: Every day | ORAL | 3 refills | Status: AC
Start: 2022-11-23 — End: ?
  Filled 2022-11-23: qty 30, 30d supply, fill #0

## 2022-11-23 MED FILL — Heparin Sodium (Porcine) Inj 1000 Unit/ML: INTRAMUSCULAR | Qty: 10 | Status: AC

## 2022-11-23 NOTE — Telephone Encounter (Signed)
Transition of Care Follow-up Phone Call Request    Patient Name: Daniel Glover Date of Birth: [birth year] Date of Encounter: 11/23/2022  Primary Care Provider:  Patient, No Pcp Per Primary Cardiologist:  Daniel Fair, MD  Daniel Glover has been scheduled for a transition of care follow up appointment with a [facility] provider:  Azalee Course on 12/01/22 @10 :55am.  Please reach out to Daniel Glover within 48 hours of discharge to confirm appointment and review transition of care protocol questionnaire. Anticipated discharge date: 11/23/22  Perlie Gold, PA-C  11/23/2022, 4:50 PM

## 2022-11-23 NOTE — Telephone Encounter (Signed)
Patient still admitted. Will need a call once discharged

## 2022-11-23 NOTE — TOC Benefit Eligibility Note (Signed)
Pharmacy Patient Advocate Encounter  Insurance verification completed.    The patient is insured through  [employer]    Ran test claim for Eliquis. Currently a quantity of 60 is a 30 day supply and the co-pay is $4.00 .  Ran test claim for Xarelto. Currently a quantity of 30 is a 30 day supply and the co-pay is $4.00 .   This test claim was processed through [facility]- copay amounts may vary at other pharmacies due to pharmacy/plan contracts, or as the patient moves through the different stages of their insurance plan.

## 2022-11-23 NOTE — Progress Notes (Signed)
  Echocardiogram 2D Echocardiogram has been performed.  Leda Roys RDCS 11/23/2022, 12:57 PM

## 2022-11-23 NOTE — Plan of Care (Signed)

## 2022-11-23 NOTE — Progress Notes (Addendum)
Patient Name: Daniel Glover Date of Encounter: 11/23/2022 [facility] Cardiologist: None   Interval Summary  .    Patient reports overnight resolution of chest pain though he did have brief recurrent discomfort on the left side of his chest with ambulation this morning. Pain has since fully subsided again while in bed. No dyspnea or other focal complaints.  Vital Signs .    Vitals:   11/23/22 0304 11/23/22 0311 11/23/22 0343 11/23/22 0431  BP:      Pulse: 63 64 67   Resp: 19 18 15    Temp:    98.3 F (36.8 C)  TempSrc:    Oral  SpO2: 97% 97% 97%   Weight:      Height:        Intake/Output Summary (Last 24 hours) at 11/23/2022 0803 Last data filed at 11/23/2022 0300 Gross per 24 hour  Intake 576.83 ml  Output 2000 ml  Net -1423.17 ml      11/22/2022    5:25 AM 04/10/2017    1:57 PM 03/29/2017    9:32 AM  Last 3 Weights  Weight (lbs) 187 lb 186 lb 189 lb  Weight (kg) 84.823 kg 84.369 kg 85.73 kg      Telemetry/ECG    Sinus rhythm with rare PVCs - Personally Reviewed  Physical Exam .   GEN: No acute distress.   Neck: No JVD Cardiac: RRR, no murmurs, rubs, or gallops.  Respiratory: Clear to auscultation bilaterally. GI: Soft, nontender, non-distended  MS: No edema  Assessment & Plan .     Chest pain Elevated troponin Hx PE/DVT  Patient admitted following emergent LHC for possible inferior STEMI. His LHC was without obstruction and given patient's history of significant PE, CTA chest checked and found with no evidence of obstruction. ESR and CRP checked and inconclusive for myocarditis.   Etiology of patient's troponin leak remains unclear but question infarction with spontaneous reperfusion with troponin trend of 280->10,514->8,692. Less likely myocarditis based on labs. Given patient's PE and prior arterial thrombus, certainly reasonable to attribute transient ischemia to now a third unprovoked instance of coagulopathy and strongly favor ongoing use of  DOAC. Patient previously followed by hem/onc and coagulopathy workup in 2018 consistent with the presence of a lupus anticoagulant, will repeat this admission.  ECG with resolution of inferior lead ischemic changes today. TTE is pending today, will follow closely.  Hyperlipidemia  Patient's LDL on admission 214 (not on statin therapy).   Initiate statin therapy today. Crestor 40mg .  LPA pending  Hypertension  BP moderately elevated most of yesterday. No updated BP this morning but if similar to yesterday, would consider initiation of anti-hypertensive agent. Will follow updated vitals.  For questions or updates, please contact [facility] Please consult [email or web address] for contact info under        Signed, Perlie Gold, PA-C   I have seen and examined the patient along with Perlie Gold, PA-C .  I have reviewed the chart, notes and new data.  I agree with PA/NP's note.  Key new complaints: chest pain has resolved Key examination changes: no clinical signs of CHF, no arrhythmia, no rub Key new findings / data: Troponin rise and fall c/w recent re-perfused small acute MI. ECG w isoelectric ST segments. Minimally elevated CRP and ESR.  PLAN: Suspect he had a small inferior wall MI due to thromboembolic event or in-situ thrombosis. 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  Acute myocarditis  is considered, but his pain was not pleuritic and ECG changes have evolved very rapidly, more in keeping with an ischemic event.  Echo pending. This may help elucidate a diagnosis, especially if we see localized inferior wall hypokinesis. We amy follow up with MRI.  Regardless, it is probably important for him to be on lifelong anticoagulation, as was recommended by the Hematology consultant in 2018.  Thurmon Fair, MD, [facility] [phone] 11/23/2022, 11:02 AM

## 2022-11-23 NOTE — Progress Notes (Signed)
Mobility Specialist Progress Note:   11/23/22 1400  Mobility  Activity Ambulated independently in hallway  Level of Assistance Standby assist, set-up cues, supervision of patient - no hands on  Assistive Device None  Distance Ambulated (ft) 940 ft  Activity Response Tolerated well  Mobility Referral Yes  $Mobility charge 1 Mobility  Mobility Specialist Start Time (ACUTE ONLY) 1419  Mobility Specialist Stop Time (ACUTE ONLY) 1429  Mobility Specialist Time Calculation (min) (ACUTE ONLY) 10 min    Pre Mobility: 90 HR,  93% SpO2 During Mobility: 91 HR,  95% SpO2 Post Mobility:  88 HR,  91% SpO2  Pt received on EOB, agreeable to mobility. C/o slight sinus headache. Otherwise asymptomatic. Returned to room w/o fault. Pt left on EOB with call bell and all needs met.  D'Vante Earlene Plater Mobility Specialist Please contact via Special educational needs teacher or Rehab office at [phone]

## 2022-11-23 NOTE — Discharge Summary (Signed)
Discharge Summary    Patient ID: Daniel Glover MRN: [id number]; DOB: 12/28/1960  Admit date: 11/22/2022 Discharge date: 11/23/2022  PCP:  Patient, No Pcp Per   [facility] Providers Cardiologist:  Thurmon Fair, MD        Discharge Diagnoses    Principal Problem:   Chest pain at rest Active Problems:   ST elevation myocardial infarction (STEMI) [facility])    Diagnostic Studies/Procedures    11/22/22 LHC  1.  Patent coronary arteries with mild irregularities but no significant stenoses throughout the major epicardial vessels 2.  Normal LVEF with no wall motion abnormalities identified, EF 55 to 65%, with normal LVEDP   Recommendations: Check 2D echo, cycle high-sensitivity troponins.  Initial high-sensitivity troponin is elevated consider vasospasm, myopericarditis, small vessel event  11/23/22 TTE  IMPRESSIONS    1. Left ventricular ejection fraction, by estimation, is 60 to 65%. The  left ventricle has normal function. The left ventricle has no regional  wall motion abnormalities. Left ventricular diastolic parameters are  indeterminate.   2. Right ventricular systolic function is normal. The right ventricular  size is normal. Tricuspid regurgitation signal is inadequate for assessing  PA pressure.   3. The mitral valve is normal in structure. No evidence of mitral valve  regurgitation. No evidence of mitral stenosis.   4. The aortic valve is normal in structure. Aortic valve regurgitation is  not visualized. No aortic stenosis is present.   5. The inferior vena cava is normal in size with greater than 50%  respiratory variability, suggesting right atrial pressure of 3 mmHg.   FINDINGS   Left Ventricle: Left ventricular ejection fraction, by estimation, is 60  to 65%. The left ventricle has normal function. The left ventricle has no  regional wall motion abnormalities. The left ventricular internal cavity  size was normal in size. There is   no left  ventricular hypertrophy. Left ventricular diastolic parameters  are indeterminate.  _____________   History of Present Illness     Daniel Glover is a 62 y.o. male with history of PE and DVT in 2019 (recommended to be on lifelong OAC by hem/onc but stopped due to cost) who was seen 11/22/2022 for the evaluation of chest pain.   Patient presented to the ED after waking up this morning and developing squeezing chest pain while getting ready for work. He described central/substernal pain that radiates into his arm. Also reported some vague sensation of dizziness. Denied shortness of breath or GI upset. Initial ECG in the ED with sinus rhythm and nonspecific T wave changes in anterior leads. Repeat tracing with ongoing chest pain concerning for inferior lead ST elevation. Patient denied any recent chest pain, shortness of breath, or exertional tolerance prior to acute episode.   Hospital Course     Consultants: n/a     Chest pain Elevated troponin Hx PE/DVT   Patient admitted following emergent LHC for possible inferior STEMI. His LHC was without obstruction and given patient's history of significant PE, CTA chest checked and found with no evidence of obstruction. ESR and CRP checked and inconclusive for myocarditis. TTE with normal LVEF.    Etiology of patient's troponin leak remains unclear very suspicious for infarction with spontaneous reperfusion with troponin trend of 280->10,514->8,692. Less likely myocarditis based on labs and echocardiogram. Given patient's PE and prior arterial thrombus, certainly reasonable to attribute transient ischemia to now a third unprovoked instance of coagulopathy and strongly favor lifelong DOAC. Patient previously followed by hem/onc and  Discharge Summary    Patient ID: Daniel Glover MRN: [id number]; DOB: 12/28/1960  Admit date: 11/22/2022 Discharge date: 11/23/2022  PCP:  Patient, No Pcp Per   [facility] Providers Cardiologist:  Thurmon Fair, MD        Discharge Diagnoses    Principal Problem:   Chest pain at rest Active Problems:   ST elevation myocardial infarction (STEMI) [facility])    Diagnostic Studies/Procedures    11/22/22 LHC  1.  Patent coronary arteries with mild irregularities but no significant stenoses throughout the major epicardial vessels 2.  Normal LVEF with no wall motion abnormalities identified, EF 55 to 65%, with normal LVEDP   Recommendations: Check 2D echo, cycle high-sensitivity troponins.  Initial high-sensitivity troponin is elevated consider vasospasm, myopericarditis, small vessel event  11/23/22 TTE  IMPRESSIONS    1. Left ventricular ejection fraction, by estimation, is 60 to 65%. The  left ventricle has normal function. The left ventricle has no regional  wall motion abnormalities. Left ventricular diastolic parameters are  indeterminate.   2. Right ventricular systolic function is normal. The right ventricular  size is normal. Tricuspid regurgitation signal is inadequate for assessing  PA pressure.   3. The mitral valve is normal in structure. No evidence of mitral valve  regurgitation. No evidence of mitral stenosis.   4. The aortic valve is normal in structure. Aortic valve regurgitation is  not visualized. No aortic stenosis is present.   5. The inferior vena cava is normal in size with greater than 50%  respiratory variability, suggesting right atrial pressure of 3 mmHg.   FINDINGS   Left Ventricle: Left ventricular ejection fraction, by estimation, is 60  to 65%. The left ventricle has normal function. The left ventricle has no  regional wall motion abnormalities. The left ventricular internal cavity  size was normal in size. There is   no left  ventricular hypertrophy. Left ventricular diastolic parameters  are indeterminate.  _____________   History of Present Illness     Daniel Glover is a 62 y.o. male with history of PE and DVT in 2019 (recommended to be on lifelong OAC by hem/onc but stopped due to cost) who was seen 11/22/2022 for the evaluation of chest pain.   Patient presented to the ED after waking up this morning and developing squeezing chest pain while getting ready for work. He described central/substernal pain that radiates into his arm. Also reported some vague sensation of dizziness. Denied shortness of breath or GI upset. Initial ECG in the ED with sinus rhythm and nonspecific T wave changes in anterior leads. Repeat tracing with ongoing chest pain concerning for inferior lead ST elevation. Patient denied any recent chest pain, shortness of breath, or exertional tolerance prior to acute episode.   Hospital Course     Consultants: n/a     Chest pain Elevated troponin Hx PE/DVT   Patient admitted following emergent LHC for possible inferior STEMI. His LHC was without obstruction and given patient's history of significant PE, CTA chest checked and found with no evidence of obstruction. ESR and CRP checked and inconclusive for myocarditis. TTE with normal LVEF.    Etiology of patient's troponin leak remains unclear very suspicious for infarction with spontaneous reperfusion with troponin trend of 280->10,514->8,692. Less likely myocarditis based on labs and echocardiogram. Given patient's PE and prior arterial thrombus, certainly reasonable to attribute transient ischemia to now a third unprovoked instance of coagulopathy and strongly favor lifelong DOAC. Patient previously followed by hem/onc and  ECHOCARDIOGRAM COMPLETE  Result Date: 11/23/2022    ECHOCARDIOGRAM REPORT   Patient Name:   Daniel Glover Date of Exam: 11/23/2022 Medical Rec #:  [id number]    Height:       66.0 in Accession #:    [id number]   Weight:       187.0 lb Date of Birth:  February 17, 1961     BSA:          1.943 m Patient Age:    62 years     BP:           149/88 mmHg Patient Gender: M            HR:           82 bpm. Exam Location:  Inpatient Procedure: 2D Echo, Color Doppler and Cardiac Doppler Indications:    Chest Pain R07.9  History:        Patient has prior history of Echocardiogram examinations, most                 recent 11/13/2016. Pulmonary Embolism.  Sonographer:    Harriette Bouillon RDCS Referring Phys: [phone] MICHAEL COOPER IMPRESSIONS  1. Left ventricular ejection fraction, by estimation, is 60 to 65%. The left ventricle has normal function. The left ventricle has no regional wall motion abnormalities. Left ventricular diastolic  parameters are indeterminate.  2. Right ventricular systolic function is normal. The right ventricular size is normal. Tricuspid regurgitation signal is inadequate for assessing PA pressure.  3. The mitral valve is normal in structure. No evidence of mitral valve regurgitation. No evidence of mitral stenosis.  4. The aortic valve is normal in structure. Aortic valve regurgitation is not visualized. No aortic stenosis is present.  5. The inferior vena cava is normal in size with greater than 50% respiratory variability, suggesting right atrial pressure of 3 mmHg. FINDINGS  Left Ventricle: Left ventricular ejection fraction, by estimation, is 60 to 65%. The left ventricle has normal function. The left ventricle has no regional wall motion abnormalities. The left ventricular internal cavity size was normal in size. There is  no left ventricular hypertrophy. Left ventricular diastolic parameters are indeterminate. Right Ventricle: The right ventricular size is normal. No increase in right ventricular wall thickness. Right ventricular systolic function is normal. Tricuspid regurgitation signal is inadequate for assessing PA pressure. Left Atrium: Left atrial size was normal in size. Right Atrium: Right atrial size was normal in size. Pericardium: There is no evidence of pericardial effusion. Mitral Valve: The mitral valve is normal in structure. No evidence of mitral valve regurgitation. No evidence of mitral valve stenosis. Tricuspid Valve: The tricuspid valve is normal in structure. Tricuspid valve regurgitation is not demonstrated. No evidence of tricuspid stenosis. Aortic Valve: The aortic valve is normal in structure. Aortic valve regurgitation is not visualized. No aortic stenosis is present. Pulmonic Valve: The pulmonic valve was not well visualized. Pulmonic valve regurgitation is mild. No evidence of pulmonic stenosis. Aorta: The aortic root is normal in size and structure. Venous: The inferior vena cava is  normal in size with greater than 50% respiratory variability, suggesting right atrial pressure of 3 mmHg. IAS/Shunts: No atrial level shunt detected by color flow Doppler.  LEFT VENTRICLE PLAX 2D LVIDd:         5.10 cm   Diastology LVIDs:         2.90 cm   LV e' medial:    6.96 cm/s LV PW:  Discharge Summary    Patient ID: Daniel Glover MRN: [id number]; DOB: 12/28/1960  Admit date: 11/22/2022 Discharge date: 11/23/2022  PCP:  Patient, No Pcp Per   [facility] Providers Cardiologist:  Thurmon Fair, MD        Discharge Diagnoses    Principal Problem:   Chest pain at rest Active Problems:   ST elevation myocardial infarction (STEMI) [facility])    Diagnostic Studies/Procedures    11/22/22 LHC  1.  Patent coronary arteries with mild irregularities but no significant stenoses throughout the major epicardial vessels 2.  Normal LVEF with no wall motion abnormalities identified, EF 55 to 65%, with normal LVEDP   Recommendations: Check 2D echo, cycle high-sensitivity troponins.  Initial high-sensitivity troponin is elevated consider vasospasm, myopericarditis, small vessel event  11/23/22 TTE  IMPRESSIONS    1. Left ventricular ejection fraction, by estimation, is 60 to 65%. The  left ventricle has normal function. The left ventricle has no regional  wall motion abnormalities. Left ventricular diastolic parameters are  indeterminate.   2. Right ventricular systolic function is normal. The right ventricular  size is normal. Tricuspid regurgitation signal is inadequate for assessing  PA pressure.   3. The mitral valve is normal in structure. No evidence of mitral valve  regurgitation. No evidence of mitral stenosis.   4. The aortic valve is normal in structure. Aortic valve regurgitation is  not visualized. No aortic stenosis is present.   5. The inferior vena cava is normal in size with greater than 50%  respiratory variability, suggesting right atrial pressure of 3 mmHg.   FINDINGS   Left Ventricle: Left ventricular ejection fraction, by estimation, is 60  to 65%. The left ventricle has normal function. The left ventricle has no  regional wall motion abnormalities. The left ventricular internal cavity  size was normal in size. There is   no left  ventricular hypertrophy. Left ventricular diastolic parameters  are indeterminate.  _____________   History of Present Illness     Daniel Glover is a 62 y.o. male with history of PE and DVT in 2019 (recommended to be on lifelong OAC by hem/onc but stopped due to cost) who was seen 11/22/2022 for the evaluation of chest pain.   Patient presented to the ED after waking up this morning and developing squeezing chest pain while getting ready for work. He described central/substernal pain that radiates into his arm. Also reported some vague sensation of dizziness. Denied shortness of breath or GI upset. Initial ECG in the ED with sinus rhythm and nonspecific T wave changes in anterior leads. Repeat tracing with ongoing chest pain concerning for inferior lead ST elevation. Patient denied any recent chest pain, shortness of breath, or exertional tolerance prior to acute episode.   Hospital Course     Consultants: n/a     Chest pain Elevated troponin Hx PE/DVT   Patient admitted following emergent LHC for possible inferior STEMI. His LHC was without obstruction and given patient's history of significant PE, CTA chest checked and found with no evidence of obstruction. ESR and CRP checked and inconclusive for myocarditis. TTE with normal LVEF.    Etiology of patient's troponin leak remains unclear very suspicious for infarction with spontaneous reperfusion with troponin trend of 280->10,514->8,692. Less likely myocarditis based on labs and echocardiogram. Given patient's PE and prior arterial thrombus, certainly reasonable to attribute transient ischemia to now a third unprovoked instance of coagulopathy and strongly favor lifelong DOAC. Patient previously followed by hem/onc and  Discharge Summary    Patient ID: Daniel Glover MRN: [id number]; DOB: 12/28/1960  Admit date: 11/22/2022 Discharge date: 11/23/2022  PCP:  Patient, No Pcp Per   [facility] Providers Cardiologist:  Thurmon Fair, MD        Discharge Diagnoses    Principal Problem:   Chest pain at rest Active Problems:   ST elevation myocardial infarction (STEMI) [facility])    Diagnostic Studies/Procedures    11/22/22 LHC  1.  Patent coronary arteries with mild irregularities but no significant stenoses throughout the major epicardial vessels 2.  Normal LVEF with no wall motion abnormalities identified, EF 55 to 65%, with normal LVEDP   Recommendations: Check 2D echo, cycle high-sensitivity troponins.  Initial high-sensitivity troponin is elevated consider vasospasm, myopericarditis, small vessel event  11/23/22 TTE  IMPRESSIONS    1. Left ventricular ejection fraction, by estimation, is 60 to 65%. The  left ventricle has normal function. The left ventricle has no regional  wall motion abnormalities. Left ventricular diastolic parameters are  indeterminate.   2. Right ventricular systolic function is normal. The right ventricular  size is normal. Tricuspid regurgitation signal is inadequate for assessing  PA pressure.   3. The mitral valve is normal in structure. No evidence of mitral valve  regurgitation. No evidence of mitral stenosis.   4. The aortic valve is normal in structure. Aortic valve regurgitation is  not visualized. No aortic stenosis is present.   5. The inferior vena cava is normal in size with greater than 50%  respiratory variability, suggesting right atrial pressure of 3 mmHg.   FINDINGS   Left Ventricle: Left ventricular ejection fraction, by estimation, is 60  to 65%. The left ventricle has normal function. The left ventricle has no  regional wall motion abnormalities. The left ventricular internal cavity  size was normal in size. There is   no left  ventricular hypertrophy. Left ventricular diastolic parameters  are indeterminate.  _____________   History of Present Illness     Daniel Glover is a 62 y.o. male with history of PE and DVT in 2019 (recommended to be on lifelong OAC by hem/onc but stopped due to cost) who was seen 11/22/2022 for the evaluation of chest pain.   Patient presented to the ED after waking up this morning and developing squeezing chest pain while getting ready for work. He described central/substernal pain that radiates into his arm. Also reported some vague sensation of dizziness. Denied shortness of breath or GI upset. Initial ECG in the ED with sinus rhythm and nonspecific T wave changes in anterior leads. Repeat tracing with ongoing chest pain concerning for inferior lead ST elevation. Patient denied any recent chest pain, shortness of breath, or exertional tolerance prior to acute episode.   Hospital Course     Consultants: n/a     Chest pain Elevated troponin Hx PE/DVT   Patient admitted following emergent LHC for possible inferior STEMI. His LHC was without obstruction and given patient's history of significant PE, CTA chest checked and found with no evidence of obstruction. ESR and CRP checked and inconclusive for myocarditis. TTE with normal LVEF.    Etiology of patient's troponin leak remains unclear very suspicious for infarction with spontaneous reperfusion with troponin trend of 280->10,514->8,692. Less likely myocarditis based on labs and echocardiogram. Given patient's PE and prior arterial thrombus, certainly reasonable to attribute transient ischemia to now a third unprovoked instance of coagulopathy and strongly favor lifelong DOAC. Patient previously followed by hem/onc and

## 2022-11-23 NOTE — TOC CM/SW Note (Signed)
Transition of Care [facility]) - Inpatient Brief Assessment   Patient Details  Name: Daniel Glover MRN: [id number] Date of Birth: 12-08-1960  Transition of Care [facility] CM/SW Contact:    Gala Lewandowsky, RN Phone Number: 11/23/2022, 3:42 PM   Clinical Narrative: Patient presented for chest pain. 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 Patient is agreeable to an appointment with the Internal Medicine Clinic. No further needs identified at this time.   Transition of Care Asessment: Insurance and Status: Insurance coverage has been reviewed Patient has primary care physician: No Home environment has been reviewed: reviewed Prior level of function:: independent Prior/Current Home Services: No current home services Social Determinants of Health Reivew: SDOH reviewed no interventions necessary Readmission risk has been reviewed: Yes Transition of care needs: transition of care needs identified, TOC will continue to follow

## 2022-11-23 NOTE — Plan of Care (Signed)
Problem: Education: Goal: Understanding of CV disease, CV risk reduction, and recovery process will improve 11/23/2022 1702 by Herma Carson, RN Outcome: Adequate for Discharge 11/23/2022 0735 by Herma Carson, RN Outcome: Progressing Goal: Individualized Educational Video(s) 11/23/2022 1702 by Herma Carson, RN Outcome: Adequate for Discharge 11/23/2022 0735 by Herma Carson, RN Outcome: Progressing   Problem: Activity: Goal: Ability to return to baseline activity level will improve 11/23/2022 1702 by Herma Carson, RN Outcome: Adequate for Discharge 11/23/2022 0735 by Herma Carson, RN Outcome: Progressing   Problem: Cardiovascular: Goal: Ability to achieve and maintain adequate cardiovascular perfusion will improve 11/23/2022 1702 by Herma Carson, RN Outcome: Adequate for Discharge 11/23/2022 0735 by Herma Carson, RN Outcome: Progressing Goal: Vascular access site(s) Level 0-1 will be maintained 11/23/2022 1702 by Herma Carson, RN Outcome: Adequate for Discharge 11/23/2022 0735 by Herma Carson, RN Outcome: Progressing   Problem: Health Behavior/Discharge Planning: Goal: Ability to safely manage health-related needs after discharge will improve 11/23/2022 1702 by Herma Carson, RN Outcome: Adequate for Discharge 11/23/2022 0735 by Herma Carson, RN Outcome: Progressing   Problem: Education: Goal: Knowledge of General Education information will improve Description: Including pain rating scale, medication(s)/side effects and non-pharmacologic comfort measures 11/23/2022 1702 by Herma Carson, RN Outcome: Adequate for Discharge 11/23/2022 0735 by Herma Carson, RN Outcome: Progressing   Problem: Health Behavior/Discharge Planning: Goal: Ability to manage health-related needs will improve 11/23/2022 1702 by Herma Carson, RN Outcome: Adequate for Discharge 11/23/2022 0735 by Herma Carson, RN Outcome: Progressing   Problem: Clinical  Measurements: Goal: Ability to maintain clinical measurements within normal limits will improve 11/23/2022 1702 by Herma Carson, RN Outcome: Adequate for Discharge 11/23/2022 0735 by Herma Carson, RN Outcome: Progressing Goal: Will remain free from infection 11/23/2022 1702 by Herma Carson, RN Outcome: Adequate for Discharge 11/23/2022 0735 by Herma Carson, RN Outcome: Progressing Goal: Diagnostic test results will improve 11/23/2022 1702 by Herma Carson, RN Outcome: Adequate for Discharge 11/23/2022 0735 by Herma Carson, RN Outcome: Progressing Goal: Respiratory complications will improve 11/23/2022 1702 by Herma Carson, RN Outcome: Adequate for Discharge 11/23/2022 0735 by Herma Carson, RN Outcome: Progressing Goal: Cardiovascular complication will be avoided 11/23/2022 1702 by Herma Carson, RN Outcome: Adequate for Discharge 11/23/2022 0735 by Herma Carson, RN Outcome: Progressing   Problem: Activity: Goal: Risk for activity intolerance will decrease 11/23/2022 1702 by Herma Carson, RN Outcome: Adequate for Discharge 11/23/2022 0735 by Herma Carson, RN Outcome: Progressing   Problem: Nutrition: Goal: Adequate nutrition will be maintained 11/23/2022 1702 by Herma Carson, RN Outcome: Adequate for Discharge 11/23/2022 0735 by Herma Carson, RN Outcome: Progressing   Problem: Coping: Goal: Level of anxiety will decrease 11/23/2022 1702 by Herma Carson, RN Outcome: Adequate for Discharge 11/23/2022 0735 by Herma Carson, RN Outcome: Progressing   Problem: Elimination: Goal: Will not experience complications related to bowel motility 11/23/2022 1702 by Herma Carson, RN Outcome: Adequate for Discharge 11/23/2022 0735 by Herma Carson, RN Outcome: Progressing Goal: Will not experience complications related to urinary retention 11/23/2022 1702 by Herma Carson, RN Outcome: Adequate for Discharge 11/23/2022 0735 by Herma Carson,  RN Outcome: Progressing   Problem: Pain Managment: Goal: General experience of comfort will improve 11/23/2022 1702 by Herma Carson, RN Outcome: Adequate for Discharge 11/23/2022 0735 by Herma Carson, RN Outcome: Progressing   Problem:  Safety: Goal: Ability to remain free from injury will improve 11/23/2022 1702 by Herma Carson, RN Outcome: Adequate for Discharge 11/23/2022 0735 by Herma Carson, RN Outcome: Progressing   Problem: Skin Integrity: Goal: Risk for impaired skin integrity will decrease 11/23/2022 1702 by Herma Carson, RN Outcome: Adequate for Discharge 11/23/2022 0735 by Herma Carson, RN Outcome: Progressing

## 2022-11-24 LAB — LUPUS ANTICOAGULANT PANEL
DRVVT: 78.6 s — ABNORMAL HIGH (ref 0.0–47.0)
PTT Lupus Anticoagulant: 43.1 s (ref 0.0–43.5)

## 2022-11-24 LAB — DRVVT CONFIRM: dRVVT Confirm: 1.6 {ratio} — ABNORMAL HIGH (ref 0.8–1.2)

## 2022-11-24 LAB — DRVVT MIX: dRVVT Mix: 50.5 s — ABNORMAL HIGH (ref 0.0–40.4)

## 2022-11-24 NOTE — Telephone Encounter (Signed)
Call to patient. Number listed is a non working number. Call to Son Carlean Jews) States call cannot be completed at this time.  Unable to [facility] for patient

## 2022-11-25 LAB — LIPOPROTEIN A (LPA): Lipoprotein (a): 160.6 nmol/L — ABNORMAL HIGH (ref <75.0)

## 2022-12-01 ENCOUNTER — Ambulatory Visit: Payer: 59 | Admitting: Physician Assistant

## 2022-12-18 ENCOUNTER — Ambulatory Visit: Payer: 59 | Admitting: Student

## 2023-01-04 ENCOUNTER — Ambulatory Visit: Payer: 59 | Attending: Physician Assistant | Admitting: Physician Assistant

## 2023-01-04 NOTE — Progress Notes (Signed)
This encounter was created in error - please disregard.

## 2023-03-06 ENCOUNTER — Other Ambulatory Visit (HOSPITAL_COMMUNITY): Payer: Self-pay
